# Patient Record
Sex: Female | Born: 1965 | Race: Black or African American | Hispanic: No | Marital: Single | Smoking: Never smoker
Health system: Southern US, Community
[De-identification: ages and names within clinical notes are randomized; demographics above are authoritative.]

## PROBLEM LIST (undated history)

## (undated) DIAGNOSIS — B009 Herpesviral infection, unspecified: Secondary | ICD-10-CM

## (undated) DIAGNOSIS — D649 Anemia, unspecified: Secondary | ICD-10-CM

## (undated) DIAGNOSIS — R011 Cardiac murmur, unspecified: Secondary | ICD-10-CM

## (undated) DIAGNOSIS — M1990 Unspecified osteoarthritis, unspecified site: Secondary | ICD-10-CM

## (undated) DIAGNOSIS — R87610 Atypical squamous cells of undetermined significance on cytologic smear of cervix (ASC-US): Secondary | ICD-10-CM

## (undated) DIAGNOSIS — E119 Type 2 diabetes mellitus without complications: Secondary | ICD-10-CM

## (undated) DIAGNOSIS — I10 Essential (primary) hypertension: Secondary | ICD-10-CM

## (undated) DIAGNOSIS — R87810 Cervical high risk human papillomavirus (HPV) DNA test positive: Secondary | ICD-10-CM

## (undated) HISTORY — DX: Atypical squamous cells of undetermined significance on cytologic smear of cervix (ASC-US): R87.610

## (undated) HISTORY — DX: Type 2 diabetes mellitus without complications: E11.9

## (undated) HISTORY — PX: COSMETIC SURGERY: SHX468

## (undated) HISTORY — PX: BREAST SURGERY: SHX581

## (undated) HISTORY — DX: Cervical high risk human papillomavirus (HPV) DNA test positive: R87.810

## (undated) HISTORY — DX: Herpesviral infection, unspecified: B00.9

---

## 1998-07-28 ENCOUNTER — Ambulatory Visit (HOSPITAL_COMMUNITY): Admission: RE | Admit: 1998-07-28 | Discharge: 1998-07-28 | Payer: Self-pay | Admitting: Family Medicine

## 1999-03-01 ENCOUNTER — Ambulatory Visit (HOSPITAL_COMMUNITY): Admission: RE | Admit: 1999-03-01 | Discharge: 1999-03-01 | Payer: Self-pay | Admitting: Family Medicine

## 1999-08-31 ENCOUNTER — Other Ambulatory Visit: Admission: RE | Admit: 1999-08-31 | Discharge: 1999-08-31 | Payer: Self-pay | Admitting: Family Medicine

## 1999-09-27 ENCOUNTER — Encounter: Payer: Self-pay | Admitting: Family Medicine

## 1999-09-27 ENCOUNTER — Ambulatory Visit (HOSPITAL_COMMUNITY): Admission: RE | Admit: 1999-09-27 | Discharge: 1999-09-27 | Payer: Self-pay | Admitting: Family Medicine

## 2001-07-15 ENCOUNTER — Other Ambulatory Visit: Admission: RE | Admit: 2001-07-15 | Discharge: 2001-07-15 | Payer: Self-pay | Admitting: Family Medicine

## 2001-08-16 ENCOUNTER — Encounter: Admission: RE | Admit: 2001-08-16 | Discharge: 2001-11-14 | Payer: Self-pay | Admitting: Surgical Oncology

## 2002-11-04 ENCOUNTER — Emergency Department (HOSPITAL_COMMUNITY): Admission: EM | Admit: 2002-11-04 | Discharge: 2002-11-05 | Payer: Self-pay | Admitting: Emergency Medicine

## 2002-11-05 ENCOUNTER — Encounter: Payer: Self-pay | Admitting: *Deleted

## 2005-01-20 ENCOUNTER — Emergency Department (HOSPITAL_COMMUNITY): Admission: EM | Admit: 2005-01-20 | Discharge: 2005-01-21 | Payer: Self-pay | Admitting: Emergency Medicine

## 2006-02-17 ENCOUNTER — Emergency Department (HOSPITAL_COMMUNITY): Admission: EM | Admit: 2006-02-17 | Discharge: 2006-02-17 | Payer: Self-pay | Admitting: Family Medicine

## 2007-04-25 ENCOUNTER — Emergency Department (HOSPITAL_COMMUNITY): Admission: EM | Admit: 2007-04-25 | Discharge: 2007-04-25 | Payer: Self-pay | Admitting: Family Medicine

## 2007-04-26 ENCOUNTER — Emergency Department (HOSPITAL_COMMUNITY): Admission: EM | Admit: 2007-04-26 | Discharge: 2007-04-26 | Payer: Self-pay | Admitting: Emergency Medicine

## 2007-05-23 ENCOUNTER — Ambulatory Visit: Payer: Self-pay | Admitting: Internal Medicine

## 2007-06-13 ENCOUNTER — Ambulatory Visit: Payer: Self-pay | Admitting: Internal Medicine

## 2007-06-14 ENCOUNTER — Ambulatory Visit: Payer: Self-pay | Admitting: Internal Medicine

## 2007-08-10 DIAGNOSIS — E669 Obesity, unspecified: Secondary | ICD-10-CM | POA: Insufficient documentation

## 2007-08-10 DIAGNOSIS — E66811 Obesity, class 1: Secondary | ICD-10-CM | POA: Insufficient documentation

## 2007-08-10 DIAGNOSIS — D509 Iron deficiency anemia, unspecified: Secondary | ICD-10-CM | POA: Insufficient documentation

## 2007-08-10 DIAGNOSIS — I10 Essential (primary) hypertension: Secondary | ICD-10-CM | POA: Insufficient documentation

## 2007-08-15 ENCOUNTER — Encounter (INDEPENDENT_AMBULATORY_CARE_PROVIDER_SITE_OTHER): Payer: Self-pay | Admitting: Internal Medicine

## 2007-08-15 ENCOUNTER — Ambulatory Visit: Payer: Self-pay | Admitting: Internal Medicine

## 2007-08-15 DIAGNOSIS — A5901 Trichomonal vulvovaginitis: Secondary | ICD-10-CM | POA: Insufficient documentation

## 2007-08-15 LAB — CONVERTED CEMR LAB
ALT: 12 units/L (ref 0–35)
Basophils Absolute: 0 10*3/uL (ref 0.0–0.1)
CO2: 26 meq/L (ref 19–32)
Calcium: 10 mg/dL (ref 8.4–10.5)
Chloride: 101 meq/L (ref 96–112)
Hemoglobin: 11.2 g/dL — ABNORMAL LOW (ref 12.0–15.0)
Lymphocytes Relative: 39 % (ref 12–46)
Lymphs Abs: 3.5 10*3/uL — ABNORMAL HIGH (ref 0.7–3.3)
MCHC: 30.3 g/dL (ref 30.0–36.0)
MCV: 91.8 fL (ref 78.0–100.0)
Neutro Abs: 4.4 10*3/uL (ref 1.7–7.7)
Neutrophils Relative %: 49 % (ref 43–77)
Platelets: 493 10*3/uL — ABNORMAL HIGH (ref 150–400)
RBC: 4.03 M/uL (ref 3.87–5.11)
Sodium: 140 meq/L (ref 135–145)
Total Bilirubin: 0.3 mg/dL (ref 0.3–1.2)
Total Protein: 8.3 g/dL (ref 6.0–8.3)

## 2007-08-27 ENCOUNTER — Ambulatory Visit: Payer: Self-pay | Admitting: Internal Medicine

## 2007-09-24 ENCOUNTER — Encounter (INDEPENDENT_AMBULATORY_CARE_PROVIDER_SITE_OTHER): Payer: Self-pay | Admitting: Internal Medicine

## 2007-10-29 ENCOUNTER — Ambulatory Visit: Payer: Self-pay | Admitting: Internal Medicine

## 2007-10-31 ENCOUNTER — Ambulatory Visit: Payer: Self-pay | Admitting: Internal Medicine

## 2007-11-04 ENCOUNTER — Encounter (INDEPENDENT_AMBULATORY_CARE_PROVIDER_SITE_OTHER): Payer: Self-pay | Admitting: Internal Medicine

## 2007-12-24 ENCOUNTER — Ambulatory Visit: Payer: Self-pay | Admitting: Internal Medicine

## 2007-12-24 LAB — CONVERTED CEMR LAB: Blood Glucose, Fingerstick: 161

## 2007-12-25 ENCOUNTER — Telehealth (INDEPENDENT_AMBULATORY_CARE_PROVIDER_SITE_OTHER): Payer: Self-pay | Admitting: Internal Medicine

## 2007-12-31 ENCOUNTER — Ambulatory Visit: Payer: Self-pay | Admitting: Family Medicine

## 2007-12-31 ENCOUNTER — Encounter (INDEPENDENT_AMBULATORY_CARE_PROVIDER_SITE_OTHER): Payer: Self-pay | Admitting: Internal Medicine

## 2008-01-03 ENCOUNTER — Ambulatory Visit: Payer: Self-pay | Admitting: *Deleted

## 2008-02-04 ENCOUNTER — Ambulatory Visit: Payer: Self-pay | Admitting: Internal Medicine

## 2008-02-04 DIAGNOSIS — R7302 Impaired glucose tolerance (oral): Secondary | ICD-10-CM | POA: Insufficient documentation

## 2008-02-04 LAB — CONVERTED CEMR LAB
Blood Glucose, AC Bkfst: 125 mg/dL
Hgb A1c MFr Bld: 7.2 %

## 2008-07-04 ENCOUNTER — Emergency Department (HOSPITAL_COMMUNITY): Admission: EM | Admit: 2008-07-04 | Discharge: 2008-07-04 | Payer: Self-pay | Admitting: Emergency Medicine

## 2008-07-17 ENCOUNTER — Ambulatory Visit: Payer: Self-pay | Admitting: Internal Medicine

## 2008-07-18 HISTORY — PX: OTHER SURGICAL HISTORY: SHX169

## 2008-07-25 ENCOUNTER — Encounter (INDEPENDENT_AMBULATORY_CARE_PROVIDER_SITE_OTHER): Payer: Self-pay | Admitting: Internal Medicine

## 2008-07-25 LAB — CONVERTED CEMR LAB
Albumin: 3.9 g/dL (ref 3.5–5.2)
Alkaline Phosphatase: 85 units/L (ref 39–117)
BUN: 14 mg/dL (ref 6–23)
CO2: 25 meq/L (ref 19–32)
Calcium: 9.3 mg/dL (ref 8.4–10.5)
Cholesterol: 140 mg/dL (ref 0–200)
Eosinophils Absolute: 0.1 10*3/uL (ref 0.0–0.7)
Eosinophils Relative: 1 % (ref 0–5)
Glucose, Bld: 81 mg/dL (ref 70–99)
HCT: 35.4 % — ABNORMAL LOW (ref 36.0–46.0)
HDL: 40 mg/dL (ref >39)
LDL Cholesterol: 85 mg/dL (ref 0–99)
Lymphocytes Relative: 45 % (ref 12–46)
Monocytes Absolute: 0.6 10*3/uL (ref 0.1–1.0)
Neutrophils Relative %: 44 % (ref 43–77)
Platelets: 429 10*3/uL — ABNORMAL HIGH (ref 150–400)
Potassium: 4.6 meq/L (ref 3.5–5.3)
RBC: 3.91 M/uL (ref 3.87–5.11)
Sodium: 138 meq/L (ref 135–145)
Total Protein: 8.8 g/dL — ABNORMAL HIGH (ref 6.0–8.3)
Triglycerides: 77 mg/dL (ref <150)

## 2008-07-30 ENCOUNTER — Ambulatory Visit: Payer: Self-pay | Admitting: Internal Medicine

## 2008-08-08 LAB — CONVERTED CEMR LAB
Ferritin: 226 ng/mL (ref 10–291)
Iron: 35 ug/dL — ABNORMAL LOW (ref 42–145)
Saturation Ratios: 12 % — ABNORMAL LOW (ref 20–55)
TIBC: 284 ug/dL (ref 250–470)

## 2008-08-13 ENCOUNTER — Encounter (INDEPENDENT_AMBULATORY_CARE_PROVIDER_SITE_OTHER): Payer: Self-pay | Admitting: Internal Medicine

## 2008-08-13 ENCOUNTER — Telehealth (INDEPENDENT_AMBULATORY_CARE_PROVIDER_SITE_OTHER): Payer: Self-pay | Admitting: Internal Medicine

## 2008-08-13 DIAGNOSIS — K921 Melena: Secondary | ICD-10-CM | POA: Insufficient documentation

## 2008-09-10 ENCOUNTER — Encounter: Admission: RE | Admit: 2008-09-10 | Discharge: 2008-09-10 | Payer: Self-pay | Admitting: Internal Medicine

## 2008-09-18 ENCOUNTER — Ambulatory Visit (HOSPITAL_COMMUNITY): Admission: RE | Admit: 2008-09-18 | Discharge: 2008-09-20 | Payer: Self-pay | Admitting: Specialist

## 2008-09-20 ENCOUNTER — Encounter (INDEPENDENT_AMBULATORY_CARE_PROVIDER_SITE_OTHER): Payer: Self-pay | Admitting: Internal Medicine

## 2008-12-03 ENCOUNTER — Encounter: Admission: RE | Admit: 2008-12-03 | Discharge: 2009-01-20 | Payer: Self-pay | Admitting: Internal Medicine

## 2008-12-03 ENCOUNTER — Encounter (INDEPENDENT_AMBULATORY_CARE_PROVIDER_SITE_OTHER): Payer: Self-pay | Admitting: Internal Medicine

## 2008-12-04 ENCOUNTER — Telehealth (INDEPENDENT_AMBULATORY_CARE_PROVIDER_SITE_OTHER): Payer: Self-pay | Admitting: Internal Medicine

## 2008-12-22 ENCOUNTER — Ambulatory Visit: Payer: Self-pay | Admitting: Internal Medicine

## 2008-12-31 ENCOUNTER — Encounter (INDEPENDENT_AMBULATORY_CARE_PROVIDER_SITE_OTHER): Payer: Self-pay | Admitting: Internal Medicine

## 2009-01-08 ENCOUNTER — Ambulatory Visit: Payer: Self-pay | Admitting: Internal Medicine

## 2009-01-08 DIAGNOSIS — L659 Nonscarring hair loss, unspecified: Secondary | ICD-10-CM | POA: Insufficient documentation

## 2009-01-08 LAB — CONVERTED CEMR LAB
Amphetamine Screen, Ur: NEGATIVE
Barbiturate Quant, Ur: NEGATIVE
Benzodiazepines.: NEGATIVE
Blood Glucose, Fingerstick: 77
Cocaine Metabolites: NEGATIVE
Hgb A1c MFr Bld: 6 %
Opiate Screen, Urine: NEGATIVE
Phencyclidine (PCP): NEGATIVE

## 2009-01-13 LAB — CONVERTED CEMR LAB
Albumin: 3.9 g/dL (ref 3.5–5.2)
Alkaline Phosphatase: 84 units/L (ref 39–117)
Basophils Absolute: 0 10*3/uL (ref 0.0–0.1)
CO2: 23 meq/L (ref 19–32)
Calcium: 9.4 mg/dL (ref 8.4–10.5)
Chloride: 101 meq/L (ref 96–112)
Eosinophils Relative: 1 % (ref 0–5)
Lymphocytes Relative: 47 % — ABNORMAL HIGH (ref 12–46)
Lymphs Abs: 2.8 10*3/uL (ref 0.7–4.0)
MCV: 93.4 fL (ref 78.0–100.0)
Neutro Abs: 2.5 10*3/uL (ref 1.7–7.7)
Neutrophils Relative %: 43 % (ref 43–77)
Platelets: 469 10*3/uL — ABNORMAL HIGH (ref 150–400)
Potassium: 4.4 meq/L (ref 3.5–5.3)
RBC: 3.94 M/uL (ref 3.87–5.11)
Sodium: 138 meq/L (ref 135–145)
TSH: 0.831 microintl units/mL (ref 0.350–4.50)
Total Protein: 8.9 g/dL — ABNORMAL HIGH (ref 6.0–8.3)
WBC: 5.9 10*3/uL (ref 4.0–10.5)

## 2009-01-19 ENCOUNTER — Encounter (INDEPENDENT_AMBULATORY_CARE_PROVIDER_SITE_OTHER): Payer: Self-pay | Admitting: Internal Medicine

## 2009-01-20 ENCOUNTER — Encounter (INDEPENDENT_AMBULATORY_CARE_PROVIDER_SITE_OTHER): Payer: Self-pay | Admitting: Internal Medicine

## 2009-01-26 ENCOUNTER — Ambulatory Visit: Payer: Self-pay | Admitting: Internal Medicine

## 2009-02-02 ENCOUNTER — Encounter (INDEPENDENT_AMBULATORY_CARE_PROVIDER_SITE_OTHER): Payer: Self-pay | Admitting: Internal Medicine

## 2009-02-07 ENCOUNTER — Encounter (INDEPENDENT_AMBULATORY_CARE_PROVIDER_SITE_OTHER): Payer: Self-pay | Admitting: Internal Medicine

## 2009-02-10 ENCOUNTER — Encounter (INDEPENDENT_AMBULATORY_CARE_PROVIDER_SITE_OTHER): Payer: Self-pay | Admitting: Internal Medicine

## 2009-02-16 ENCOUNTER — Encounter (INDEPENDENT_AMBULATORY_CARE_PROVIDER_SITE_OTHER): Payer: Self-pay | Admitting: Internal Medicine

## 2009-04-30 ENCOUNTER — Ambulatory Visit: Payer: Self-pay | Admitting: Internal Medicine

## 2009-04-30 DIAGNOSIS — J309 Allergic rhinitis, unspecified: Secondary | ICD-10-CM | POA: Insufficient documentation

## 2009-04-30 LAB — CONVERTED CEMR LAB: Blood Glucose, Fingerstick: 100

## 2009-05-05 ENCOUNTER — Telehealth (INDEPENDENT_AMBULATORY_CARE_PROVIDER_SITE_OTHER): Payer: Self-pay | Admitting: Internal Medicine

## 2009-05-12 ENCOUNTER — Telehealth (INDEPENDENT_AMBULATORY_CARE_PROVIDER_SITE_OTHER): Payer: Self-pay | Admitting: Internal Medicine

## 2009-05-13 ENCOUNTER — Telehealth (INDEPENDENT_AMBULATORY_CARE_PROVIDER_SITE_OTHER): Payer: Self-pay | Admitting: Family Medicine

## 2009-06-07 ENCOUNTER — Ambulatory Visit: Payer: Self-pay | Admitting: Family Medicine

## 2009-06-07 DIAGNOSIS — M549 Dorsalgia, unspecified: Secondary | ICD-10-CM | POA: Insufficient documentation

## 2009-06-07 LAB — CONVERTED CEMR LAB
Bilirubin Urine: NEGATIVE
Blood in Urine, dipstick: NEGATIVE
Hgb A1c MFr Bld: 6.2 %

## 2009-06-17 HISTORY — PX: GASTRIC BYPASS: SHX52

## 2009-07-01 ENCOUNTER — Encounter (INDEPENDENT_AMBULATORY_CARE_PROVIDER_SITE_OTHER): Payer: Self-pay | Admitting: Internal Medicine

## 2009-07-08 ENCOUNTER — Encounter (INDEPENDENT_AMBULATORY_CARE_PROVIDER_SITE_OTHER): Payer: Self-pay | Admitting: Internal Medicine

## 2009-07-09 ENCOUNTER — Encounter (INDEPENDENT_AMBULATORY_CARE_PROVIDER_SITE_OTHER): Payer: Self-pay | Admitting: Internal Medicine

## 2009-08-11 ENCOUNTER — Encounter (INDEPENDENT_AMBULATORY_CARE_PROVIDER_SITE_OTHER): Payer: Self-pay | Admitting: Internal Medicine

## 2009-08-26 ENCOUNTER — Ambulatory Visit: Payer: Self-pay | Admitting: Internal Medicine

## 2009-08-26 LAB — CONVERTED CEMR LAB: Hgb A1c MFr Bld: 5.9 %

## 2009-08-31 ENCOUNTER — Encounter (INDEPENDENT_AMBULATORY_CARE_PROVIDER_SITE_OTHER): Payer: Self-pay | Admitting: Internal Medicine

## 2009-09-23 ENCOUNTER — Encounter (INDEPENDENT_AMBULATORY_CARE_PROVIDER_SITE_OTHER): Payer: Self-pay | Admitting: Internal Medicine

## 2009-09-24 ENCOUNTER — Encounter (INDEPENDENT_AMBULATORY_CARE_PROVIDER_SITE_OTHER): Payer: Self-pay | Admitting: Internal Medicine

## 2009-09-30 ENCOUNTER — Encounter (INDEPENDENT_AMBULATORY_CARE_PROVIDER_SITE_OTHER): Payer: Self-pay | Admitting: Internal Medicine

## 2009-10-01 ENCOUNTER — Telehealth (INDEPENDENT_AMBULATORY_CARE_PROVIDER_SITE_OTHER): Payer: Self-pay | Admitting: Internal Medicine

## 2009-10-14 ENCOUNTER — Encounter (INDEPENDENT_AMBULATORY_CARE_PROVIDER_SITE_OTHER): Payer: Self-pay | Admitting: Internal Medicine

## 2009-10-14 ENCOUNTER — Encounter: Admission: RE | Admit: 2009-10-14 | Discharge: 2009-12-15 | Payer: Self-pay | Admitting: Internal Medicine

## 2009-12-24 ENCOUNTER — Other Ambulatory Visit: Admission: RE | Admit: 2009-12-24 | Discharge: 2009-12-24 | Payer: Self-pay | Admitting: Internal Medicine

## 2009-12-24 ENCOUNTER — Ambulatory Visit: Payer: Self-pay | Admitting: Internal Medicine

## 2009-12-24 DIAGNOSIS — N760 Acute vaginitis: Secondary | ICD-10-CM | POA: Insufficient documentation

## 2009-12-24 DIAGNOSIS — M1710 Unilateral primary osteoarthritis, unspecified knee: Secondary | ICD-10-CM

## 2009-12-24 DIAGNOSIS — IMO0002 Reserved for concepts with insufficient information to code with codable children: Secondary | ICD-10-CM | POA: Insufficient documentation

## 2009-12-24 LAB — CONVERTED CEMR LAB
Bilirubin Urine: NEGATIVE
Blood in Urine, dipstick: NEGATIVE
Chlamydia, DNA Probe: NEGATIVE
Glucose, Urine, Semiquant: NEGATIVE
Nitrite: NEGATIVE
Protein, U semiquant: NEGATIVE
Specific Gravity, Urine: 1.01
Urobilinogen, UA: 1
WBC Urine, dipstick: NEGATIVE
Whiff Test: NEGATIVE

## 2009-12-27 ENCOUNTER — Ambulatory Visit: Payer: Self-pay | Admitting: Internal Medicine

## 2009-12-29 ENCOUNTER — Encounter (INDEPENDENT_AMBULATORY_CARE_PROVIDER_SITE_OTHER): Payer: Self-pay | Admitting: Internal Medicine

## 2009-12-29 LAB — CONVERTED CEMR LAB
ALT: 10 units/L (ref 0–35)
Alkaline Phosphatase: 100 units/L (ref 39–117)
Basophils Relative: 0 % (ref 0–1)
CO2: 23 meq/L (ref 19–32)
Chloride: 104 meq/L (ref 96–112)
Cholesterol: 133 mg/dL (ref 0–200)
Creatinine, Ser: 0.66 mg/dL (ref 0.40–1.20)
Eosinophils Absolute: 0 10*3/uL (ref 0.0–0.7)
HDL: 43 mg/dL (ref >39)
Hemoglobin: 10.7 g/dL — ABNORMAL LOW (ref 12.0–15.0)
LDL Cholesterol: 77 mg/dL (ref 0–99)
Lymphocytes Relative: 51 % — ABNORMAL HIGH (ref 12–46)
Lymphs Abs: 2.4 10*3/uL (ref 0.7–4.0)
MCHC: 31 g/dL (ref 30.0–36.0)
Monocytes Absolute: 0.4 10*3/uL (ref 0.1–1.0)
Monocytes Relative: 9 % (ref 3–12)
Neutro Abs: 1.8 10*3/uL (ref 1.7–7.7)
Neutrophils Relative %: 39 % — ABNORMAL LOW (ref 43–77)
RBC: 3.63 M/uL — ABNORMAL LOW (ref 3.87–5.11)
Sodium: 142 meq/L (ref 135–145)
Total Bilirubin: 0.3 mg/dL (ref 0.3–1.2)
Total Protein: 7.7 g/dL (ref 6.0–8.3)
Triglycerides: 67 mg/dL (ref <150)
WBC: 4.6 10*3/uL (ref 4.0–10.5)

## 2009-12-30 ENCOUNTER — Ambulatory Visit (HOSPITAL_COMMUNITY): Admission: RE | Admit: 2009-12-30 | Discharge: 2009-12-30 | Payer: Self-pay | Admitting: Internal Medicine

## 2009-12-31 ENCOUNTER — Ambulatory Visit: Payer: Self-pay | Admitting: Internal Medicine

## 2009-12-31 LAB — CONVERTED CEMR LAB: RBC Folate: 279 ng/mL (ref 180–600)

## 2010-01-21 LAB — CONVERTED CEMR LAB
Ferritin: 256 ng/mL (ref 10–291)
TIBC: 302 ug/dL (ref 250–470)
Vitamin B-12: 636 pg/mL (ref 211–911)

## 2010-02-10 ENCOUNTER — Encounter (INDEPENDENT_AMBULATORY_CARE_PROVIDER_SITE_OTHER): Payer: Self-pay | Admitting: Internal Medicine

## 2010-03-11 ENCOUNTER — Encounter (INDEPENDENT_AMBULATORY_CARE_PROVIDER_SITE_OTHER): Payer: Self-pay | Admitting: Internal Medicine

## 2010-08-11 ENCOUNTER — Encounter (INDEPENDENT_AMBULATORY_CARE_PROVIDER_SITE_OTHER): Payer: Self-pay | Admitting: Internal Medicine

## 2010-09-02 ENCOUNTER — Ambulatory Visit: Payer: Self-pay | Admitting: Internal Medicine

## 2010-09-02 ENCOUNTER — Encounter (INDEPENDENT_AMBULATORY_CARE_PROVIDER_SITE_OTHER): Payer: Self-pay | Admitting: Internal Medicine

## 2010-09-02 DIAGNOSIS — N920 Excessive and frequent menstruation with regular cycle: Secondary | ICD-10-CM | POA: Insufficient documentation

## 2010-09-02 LAB — CONVERTED CEMR LAB: Blood Glucose, Fingerstick: 65

## 2010-09-15 LAB — CONVERTED CEMR LAB
Eosinophils Relative: 1 % (ref 0–5)
HCT: 33.3 % — ABNORMAL LOW (ref 36.0–46.0)
Hemoglobin: 10.4 g/dL — ABNORMAL LOW (ref 12.0–15.0)
Lymphocytes Relative: 52 % — ABNORMAL HIGH (ref 12–46)
Lymphs Abs: 2.9 10*3/uL (ref 0.7–4.0)
Neutro Abs: 2.2 10*3/uL (ref 1.7–7.7)
Platelets: 349 10*3/uL (ref 150–400)
Vit D, 25-Hydroxy: 25 ng/mL — ABNORMAL LOW (ref 30–89)
WBC: 5.7 10*3/uL (ref 4.0–10.5)

## 2010-09-21 ENCOUNTER — Ambulatory Visit: Payer: Self-pay | Admitting: Internal Medicine

## 2010-09-22 ENCOUNTER — Encounter (INDEPENDENT_AMBULATORY_CARE_PROVIDER_SITE_OTHER): Payer: Self-pay | Admitting: Internal Medicine

## 2010-09-28 LAB — CONVERTED CEMR LAB: Iron: 60 ug/dL (ref 42–145)

## 2010-10-03 ENCOUNTER — Ambulatory Visit: Payer: Self-pay | Admitting: Internal Medicine

## 2010-10-03 LAB — CONVERTED CEMR LAB
Calcium: 9 mg/dL (ref 8.4–10.5)
Sodium: 144 meq/L (ref 135–145)

## 2010-10-24 ENCOUNTER — Encounter (INDEPENDENT_AMBULATORY_CARE_PROVIDER_SITE_OTHER): Payer: Self-pay | Admitting: Internal Medicine

## 2010-10-24 ENCOUNTER — Ambulatory Visit: Payer: Self-pay | Admitting: Physician Assistant

## 2010-10-24 DIAGNOSIS — I498 Other specified cardiac arrhythmias: Secondary | ICD-10-CM | POA: Insufficient documentation

## 2010-11-30 ENCOUNTER — Ambulatory Visit: Payer: Self-pay | Admitting: Internal Medicine

## 2010-12-06 ENCOUNTER — Ambulatory Visit: Payer: Self-pay | Admitting: Internal Medicine

## 2010-12-06 DIAGNOSIS — M79609 Pain in unspecified limb: Secondary | ICD-10-CM | POA: Insufficient documentation

## 2010-12-06 LAB — CONVERTED CEMR LAB: Hgb A1c MFr Bld: 5.7 %

## 2010-12-29 ENCOUNTER — Ambulatory Visit: Admit: 2010-12-29 | Payer: Self-pay | Admitting: Internal Medicine

## 2011-01-03 ENCOUNTER — Encounter (INDEPENDENT_AMBULATORY_CARE_PROVIDER_SITE_OTHER): Payer: Self-pay | Admitting: Internal Medicine

## 2011-01-17 ENCOUNTER — Encounter (INDEPENDENT_AMBULATORY_CARE_PROVIDER_SITE_OTHER): Payer: Self-pay | Admitting: Internal Medicine

## 2011-01-19 ENCOUNTER — Encounter (INDEPENDENT_AMBULATORY_CARE_PROVIDER_SITE_OTHER): Payer: Self-pay | Admitting: Internal Medicine

## 2011-01-19 NOTE — Letter (Signed)
Summary: [facility]  [facility]   Imported By: Arta Bruce 03/11/2010 09:56:39  _____________________________________________________________________  External Attachment:    Type:   Image     Comment:   External Document

## 2011-01-19 NOTE — Assessment & Plan Note (Signed)
Summary: 2-3 WEEK FU FOR BP FU///KT  Nurse Visit   Vital Signs:  Patient profile:   45 year old female Menstrual status:  regular Weight:      206.4 pounds Pulse rate:   52 / minute Pulse rhythm:   regular Resp:     20 per minute BP sitting:   146 / 80  (left arm) Cuff size:   large  Serial Vital Signs/Assessments:  Time      Position  BP       Pulse  Resp  Temp     By                              48                    Dutch Quint RN                              [address] RN  Comments: After 5 lap walk around office. By: Dutch Quint RN    Physical Exam  Lungs:  normal respiratory effort, normal breath sounds, no crackles, and no wheezes.   Heart:  normal rate and regular rhythm.  Heart rate 52, bradycardic.   Impression & Recommendations:  Problem # 1:  BRADYCARDIA (ICD-427.89)  reviewed previous visits and some with brady pulse rate EKG rate at 41 today no pulse limiting meds Pt did see cardiologist on last year - she will bring records to this office pt is athletic and does zumba and aerobics  Orders: EKG w/ Interpretation (93000)  Problem # 2:  HYPERTENSION (ICD-401.9) BP still slightly elevated but will not change at this time given need to review cardiology reports Her updated medication list for this problem includes:    Lisinopril-hydrochlorothiazide 20-12.5 Mg Tabs (Lisinopril-hydrochlorothiazide) .Marland Kitchen... 1 by mouth two times a day    Amlodipine Besylate 5 Mg Tabs (Amlodipine besylate) .Marland Kitchen... Take 1 tablet by mouth once a day  Complete Medication List: 1)  Lisinopril-hydrochlorothiazide 20-12.5 Mg Tabs (Lisinopril-hydrochlorothiazide) .Marland Kitchen.. 1 by mouth two times a day 2)  Ferrous Sulfate 325 (65 Fe) Mg Tbec (Ferrous sulfate) .Marland Kitchen.. 1 by mouth two times a day 3)  Loratadine 10 Mg Tabs (Loratadine) .Marland Kitchen.. 1 tab by mouth daily 4)  Ursodiol 300 Mg Caps (Ursodiol) .Marland Kitchen.. 1 by mouth two times a day 5)  Omeprazole 20 Mg Cpdr (Omeprazole) .Marland Kitchen..  1 by mouth once daily 6)  Celebrate Bariatric Supplements  .Marland Kitchen.. 1 capsule two times a day 7)  Loestrin Fe 1.5/30 1.5-30 Mg-mcg Tabs (Norethin ace-eth estrad-fe) .Marland Kitchen.. 1 tab daily as needed--dr. Pecola Leisure at [facility] family practice for heavy bleeding. 8)  Vitamin D3 50000 Unit Caps (Cholecalciferol) .Marland Kitchen.. 1 cap by mouth weekly 9)  Protonix 40 Mg Tbec (Pantoprazole sodium) .Marland Kitchen.. 1 cap by mouth daily on empty stomach 30 minutes before breakfast. 10)  Amlodipine Besylate 5 Mg Tabs (Amlodipine besylate) .... Take 1 tablet by mouth once a day   CC:  F/U BP check.  History of Present Illness: 10/03/10 BP was 152/78, restarted on amlodipine.  States she has taken all meds this morning.  States asymptomatic except for being a "a little off-balance/woozy" on Saturday.   Review of Systems General:  Denies fatigue and weakness. CV:  Complains of lightheadness; One episode of lightheadedness on Saturday, while cleaning her house..  CC: F/U BP check Is Patient Diabetic? Yes Did you bring your meter with you today? No Pain Assessment Patient in pain? no       Does patient need assistance? Functional Status Self care Ambulation Normal  Patient Instructions: 1)  Reviewed by Jesse Fall 2)  Your blood pressure is still elevated, but your heartrate is low. 3)  Bring your journal with cardiology information to [place] for review. 4)  We'll let you know if you need to come in to see Dr. Delrae Alfred before your scheduled appointment. 5)  Make sure you take all of your medications before your visit. 6)  Call if anything changes or if you have any questions.   Allergies: No Known Drug Allergies  Orders Added: 1)  Est. Patient Level II [04540] 2)  EKG w/ Interpretation [93000]

## 2011-01-19 NOTE — Assessment & Plan Note (Signed)
Summary: cpp exam//gk   Vital Signs:  Patient profile:   45 year old female Menstrual status:  regular Weight:      253.6 pounds Temp:     97.9 degrees F oral Pulse rate:   53 / minute Pulse rhythm:   regular Resp:     17 per minute BP sitting:   132 / 82  (left arm) Cuff size:   large  Vitals Entered By: Geanie Cooley  (December 24, 2009 3:36 PM) CC: pt here for CPP,ptstates she has had very dry skin in the cheek bone area and ears. Pt states she has been have vaginal discharge, she did have a lil itching and burning at first but not now. Pain Assessment Patient in pain? no       Does patient need assistance? Functional Status Self care Ambulation Normal   CC:  pt here for CPP, ptstates she has had very dry skin in the cheek bone area and ears. Pt states she has been have vaginal discharge, and she did have a lil itching and burning at first but not now..  History of Present Illness: 45 yo female here for CPP  Concerns:  1.  Dryness on face--including bilateral ears and extending forward onto face and chin bilaterally.  States has had this problem since gastric bypass surgery 6 months ago.  Has tried an all natural Vitamin E containing lotion--helps a bit--has to apply 3 times daily.  Takes a multivitamin, Vitamin D, B1, B12, calcium and chewable iron tab.  2.  Vaginal discharge-yellow and thick:  Had itching and odor associated with it.  Started in December.  Gradually improved.  Did use Norforms deodorant vaginal suppositories.  Has been wearing some tighter fitting clothes.  No definite antibiotics prior to symptoms  Habits & Providers  Alcohol-Tobacco-Diet     Alcohol drinks/day: 0     Tobacco Status: never  Exercise-Depression-Behavior     Drug Use: never  Allergies (verified): No Known Drug Allergies  Past History:  Past Medical History: VITAMIN D DEFICIENCY (ICD-268.9) BACK PAIN (ICD-724.5) ALLERGIC RHINITIS WITH CONJUNCTIVITIS (ICD-477.9) PREOPERATIVE  EXAMINATION (ICD-V72.84) HAIR LOSS (ICD-704.00) HEMOCCULT POSITIVE STOOL (ICD-578.1) ENCOUNTER FOR LONG-TERM USE OF OTHER MEDICATIONS (ICD-V58.69) DIABETES MELLITUS, CONTROLLED (ICD-250.00) HYPERGLYCEMIA (ICD-790.29) OBESITY, MORBID (ICD-278.01) ANEMIA, IRON DEFICIENCY (ICD-280.9) HYPERTENSION (ICD-401.9) Hx of TRICHOMONAL VAGINITIS (ICD-131.01)    Past Surgical History: Reviewed history from 08/11/2009 and no changes required. 1.  09/18/2008:  Right quadriceps tendon repair--Dr. Thomasena Edis 2.  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 Rosalita Chessman  [facility]  Bariatric program  Family History: Mother, 42:  Hypertension, DM, overweight Father, died 80:  MI, hypertension 4 Brothers:  all healthy 3 Sisters:  Twin sister Merrill:  DM, hypertension--seen here Jasmine December:  DM.  Other sister is healthy Daughter, 16:  Healthy  Social History: Female partner for 3 years--monogamous. Do not live together--he lives in [place]. 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  Group Home--MR and DD folks live there. LIves with 12 yo daughter.Drug Use:  never  Review of Systems General:  Energy is good--especially with weight loss. Eyes:  Wears contacts--no problems. ENT:  Denies decreased hearing. CV:  Denies chest pain or discomfort and palpitations. Resp:  Denies shortness of breath. GI:  Denies abdominal pain, bloody stools, constipation, dark tarry stools, and diarrhea. GU:  Denies dysuria and urinary frequency; See HPI for vaginal complaints. MS:  had cortisone shot in left knee with Dr. Darrelyn Hillock at [facility] week.  Marland Kitchen  Derm:  Mole on left shoulder--has enlarged a little bit over time.  Has had for as long as she can remember.. Neuro:  Denies numbness, tingling, and weakness. Psych:  Denies anxiety, depression, and suicidal thoughts/plans.  Physical Exam  General:  obese--though appears larger than she is secondary to significant skin folds, NAD Head:  Normocephalic and atraumatic  without obvious abnormalities. Eyes:  No corneal or conjunctival inflammation noted. EOMI. Perrla. Funduscopic exam benign, without hemorrhages, exudates or papilledema. Vision grossly normal. Ears:  External ear exam shows no significant lesions or deformities.  Otoscopic examination reveals clear canals, tympanic membranes are intact bilaterally without bulging, retraction, inflammation or discharge. Hearing is grossly normal bilaterally. Nose:  External nasal examination shows no deformity or inflammation. Nasal mucosa are pink and moist without lesions or exudates. Mouth:  Oral mucosa and oropharynx without lesions or exudates.   Neck:  No deformities, masses, or tenderness noted. Breasts:  No mass, nodules, thickening, tenderness, bulging, retraction, inflamation, nipple discharge or skin changes noted.   Lungs:  Normal respiratory effort, chest expands symmetrically. Lungs are clear to auscultation, no crackles or wheezes. Heart:  Normal rate and regular rhythm. S1 and S2 normal without gallop, murmur, click, rub or other extra sounds. Abdomen:  Bowel sounds positive,abdomen soft and non-tender without masses, organomegaly or hernias noted. Rectal:  No external abnormalities noted. Normal sphincter tone. No rectal masses or tenderness.  Heme negative light brown stool. Genitalia:  Pelvic Exam:        External: normal female genitalia without lesions or masses        Vagina: Chronic appearing white curdlike discharge without odor.  Some white discharge adherent to mucosal walls and cervix.  No significant inflammation.        Cervix: normal without lesions or masses        Adnexa: normal bimanual exam without masses or fullness        Uterus: normal by palpation        Pap smear: performed Msk:  No deformity or scoliosis noted of thoracic or lumbar spine.  Knees hypertrophic appearing Pulses:  R and L carotid,radial,femoral,dorsalis pedis and posterior tibial pulses are full and equal  bilaterally Extremities:  No clubbing, cyanosis, edema, or deformity noted with normal full range of motion of all joints.   Neurologic:  No cranial nerve deficits noted. Station and gait are normal. Plantar reflexes are down-going bilaterally. DTRs are symmetrical throughout. Sensory, motor and coordinative functions appear intact. Skin:  A bit hyperpigmented and dry on cheeks of face to chin. Significant skin folds on central body and appendages. Cervical Nodes:  No lymphadenopathy noted Axillary Nodes:  No palpable lymphadenopathy Inguinal Nodes:  No significant adenopathy Psych:  Cognition and judgment appear intact. Alert and cooperative with normal attention span and concentration. No apparent delusions, illusions, hallucinations   Impression & Recommendations:  Problem # 1:  ROUTINE GYNECOLOGICAL EXAMINATION (ICD-V72.31) Mammogram scheduled Orders: T-Pap Smear, Thin Prep (65784) T- GC Chlamydia (69629) KOH/ WET Mount [phone]) UA Dipstick w/o Micro (manual) (81002) Pap Smear, Thin Prep ( Collection of) (L2440)  Problem # 2:  VAGINITIS (ICD-616.10) Cells that somewhat look like trichomonas, possibly even with flagella, but no movement Appears clinically like yeast vaginitis Fluconazole 150 mg daily for 3 days  Problem # 3:  Preventive Health Care (ICD-V70.0) Flu vaccine  Guaiac cards x3 to return in 2 weeks.  Problem # 4:  DIABETES MELLITUS, CONTROLLED (ICD-250.00) No meds with weight loss from gastric bypass--has been controlled.  Her updated medication list for this problem includes:    Lisinopril-hydrochlorothiazide 20-12.5 Mg Tabs (Lisinopril-hydrochlorothiazide) .Marland Kitchen... 1 by mouth two times a day  Problem # 5:  HYPERTENSION (ICD-401.9) Controlled Her updated medication list for this problem includes:    Lisinopril-hydrochlorothiazide 20-12.5 Mg Tabs (Lisinopril-hydrochlorothiazide) .Marland Kitchen... 1 by mouth two times a day    Amlodipine Besylate 10 Mg Tabs (Amlodipine besylate)  .Marland Kitchen... 1 tab by mouth daily  Complete Medication List: 1)  Lisinopril-hydrochlorothiazide 20-12.5 Mg Tabs (Lisinopril-hydrochlorothiazide) .Marland Kitchen.. 1 by mouth two times a day 2)  Ferrous Sulfate 325 (65 Fe) Mg Tbec (Ferrous sulfate) .Marland Kitchen.. 1 by mouth two times a day 3)  Amlodipine Besylate 10 Mg Tabs (Amlodipine besylate) .Marland Kitchen.. 1 tab by mouth daily 4)  Loratadine 10 Mg Tabs (Loratadine) .Marland Kitchen.. 1 tab by mouth daily 5)  Ursodiol 300 Mg Caps (Ursodiol) .Marland Kitchen.. 1 by mouth two times a day 6)  Omeprazole 20 Mg Cpdr (Omeprazole) .Marland Kitchen.. 1 by mouth once daily 7)  Celebrate Bariatric Supplements  .Marland Kitchen.. 1 capsule two times a day 8)  Fluconazole 150 Mg Tabs (Fluconazole) .Marland Kitchen.. 1 tab by mouth daily for 3 days  Other Orders: Flu Vaccine 47yrs + (16109) TB Skin Test [phone]) Admin 1st Vaccine (09811) Admin of Any Addtl Vaccine (91478) Admin 1st Vaccine (State) [phone]) Admin of Any Addtl Vaccine (State) [phone])  Patient Instructions: 1)  Fasting labs with PPD reading next Monday--9 a.m.  FLP, Cmet, CBC, HIV, RPR,  Prescriptions: FLUCONAZOLE 150 MG TABS (FLUCONAZOLE) 1 tab by mouth daily for 3 days  #3 x 0   Entered and Authorized by:   Julieanne Manson MD   Signed by:   Julieanne Manson MD on 12/24/2009   Method used:   Electronically to        [facility] [phone]* (retail)       [address]       [place], [place]  [address]       Ph: [phone]       Fax: [phone]   RxID:   [id number] LORATADINE 10 MG TABS (LORATADINE) 1 tab by mouth daily  #30 x 11   Entered and Authorized by:   Julieanne Manson MD   Signed by:   Julieanne Manson MD on 12/24/2009   Method used:   Electronically to        [facility] [phone]* (retail)       [address]       [place], [place]  [address]       Ph: [phone]       Fax: [phone]   RxID:   [id number] AMLODIPINE BESYLATE 10 MG  TABS (AMLODIPINE BESYLATE) 1 tab by mouth daily  #30 x 11   Entered and Authorized by:   Julieanne Manson MD   Signed by:   Julieanne Manson MD on 12/24/2009   Method used:   Electronically to        [facility] [phone]* (retail)       [address]       [place], [place]  [address]       Ph: [phone]       Fax: [phone]   RxID:   [id number] FERROUS SULFATE 325 (65 FE) MG  TBEC (FERROUS SULFATE) 1 by mouth two times a day  #60 Each x 11   Entered and Authorized by:   Julieanne Manson MD   Signed by:   Julieanne Manson MD on 12/24/2009   Method used:  Electronically to        [facility] [phone]* (retail)       [address]       [place], [place]  [address]       Ph: [phone]       Fax: [phone]   RxID:   [id number] LISINOPRIL-HYDROCHLOROTHIAZIDE 20-12.5 MG  TABS (LISINOPRIL-HYDROCHLOROTHIAZIDE) 1 by mouth two times a day  #60 Each x 11   Entered and Authorized by:   Julieanne Manson MD   Signed by:   Julieanne Manson MD on 12/24/2009   Method used:   Electronically to        [facility] [phone]* (retail)       [address]       [place], [place]  [address]       Ph: [phone]       Fax: [phone]   RxID:   [id number]    Vital Signs:  Patient profile:   44 year old female Menstrual status:  regular Weight:      253.6 pounds Temp:     97.9 degrees F oral Pulse rate:   53 / minute Pulse rhythm:   regular Resp:     17 per minute BP sitting:   132 / 82  (left arm) Cuff size:   large  Vitals Entered By: Geanie Cooley  (December 24, 2009 3:36 PM)   Laboratory Results   Urine Tests  Date/Time Received: December 24, 2009 4:13 PM   Routine Urinalysis   Color: lt. yellow Appearance: Clear Glucose: negative   (Normal Range: Negative) Bilirubin: negative   (Normal Range: Negative) Ketone: negative   (Normal Range: Negative) Spec. Gravity: 1.010   (Normal Range: 1.003-1.035) Blood: negative   (Normal Range: Negative) pH: 7.0   (Normal Range: 5.0-8.0) Protein: negative   (Normal Range:  Negative) Urobilinogen: 1.0   (Normal Range: 0-1) Nitrite: negative   (Normal Range: Negative) Leukocyte Esterace: negative   (Normal Range: Negative)      Wet Mount Source: vaginal WBC/hpf: 5-10 Bacteria/hpf: 1+ Clue cells/hpf: few  Negative whiff Yeast/hpf: none seen Wet Mount KOH: Negative Trichomonas/hpf: Possible, but no movement     Influenza Vaccine    Vaccine Type: Fluvax 3+    Site: right deltoid    Mfr: Sanofi Pasteur    Dose: 0.5 ml    Route: IM    Given by: Geanie Cooley     Exp. Date: 06/16/2010    Lot #: [id number]    VIS given: 07/11/07 version given December 24, 2009.  Flu Vaccine Consent Questions    Do you have a history of severe allergic reactions to this vaccine? no    Any prior history of allergic reactions to egg and/or gelatin? no    Do you have a sensitivity to the preservative Thimersol? no    Do you have a past history of Guillan-Barre Syndrome? no    Do you currently have an acute febrile illness? no    Have you ever had a severe reaction to latex? no    Vaccine information given and explained to patient? yes    Are you currently pregnant? no  PPD Application    Vaccine Type: PPD    Site: right forearm    Mfr: Sanofi Pasteur    Dose: 0.1 ml    Route: ID    Given by: valincia Rathmann    Exp. Date: 01/15/2012    Lot #: [id number]   Preventive  Care Screening  Prior Values:    PPD:  negative (10/31/2007)    Last Tetanus Booster:  Tdap (07/17/2008)    Last Pneumovax:  Pneumovax (07/17/2008)     Pap:  Last appears to be in 07/2007--not in EMR--normal per pt. SBE:  No changes on monthly exam Mammogram:  not for a few years--normal previously. Periods:  skipping.  No hot flashes or night sweats. Osteoprevention:  exercises regularly with personal trainor--Vitamins currently handled by [facility]--Calcium and Vitamin D. Guaiac Cards:  Last year Colonoscopy:  Normal last year prior to gastric surgery EGD:  Normal last year prior to  gastric surgery--both Dr. Elnoria Howard.    Laboratory Results   Urine Tests    Routine Urinalysis   Color: lt. yellow Appearance: Clear Glucose: negative   (Normal Range: Negative) Bilirubin: negative   (Normal Range: Negative) Ketone: negative   (Normal Range: Negative) Spec. Gravity: 1.010   (Normal Range: 1.003-1.035) Blood: negative   (Normal Range: Negative) pH: 7.0   (Normal Range: 5.0-8.0) Protein: negative   (Normal Range: Negative) Urobilinogen: 1.0   (Normal Range: 0-1) Nitrite: negative   (Normal Range: Negative) Leukocyte Esterace: negative   (Normal Range: Negative)      Wet Mount/KOH  Negative whiff

## 2011-01-19 NOTE — Assessment & Plan Note (Signed)
Summary: PAIN IN LEFT SHOULDER AND ARM//KT   Vital Signs:  Patient profile:   45 year old female Menstrual status:  irregular LMP:     12/03/2010 Weight:      199 pounds BMI:     33.24 Temp:     97.3 degrees F oral Pulse rate:   64 / minute Pulse rhythm:   regular Resp:     18 per minute BP sitting:   122 / 70  (left arm) Cuff size:   regular  Vitals Entered By: Hale Drone CMA (December 06, 2010 4:12 PM) CC: F/U on left shoulder and arm pain. Wants to know if she can get a Vit B 12 shot since she has not taken her sub lingual Vit B12 for the last 2 months.  Is Patient Diabetic? No Pain Assessment Patient in pain? yes     Location: left arm Intensity: 5 Type: sharp Onset of pain  Intermittent CBG Result 113 CBG Device ID B non fasting  Does patient need assistance? Functional Status Self care Ambulation Normal LMP (date): 12/03/2010     Menstrual Status irregular Enter LMP: 12/03/2010 Last PAP Result NEGATIVE FOR INTRAEPITHELIAL LESIONS OR MALIGNANCY.   CC:  F/U on left shoulder and arm pain. Wants to know if she can get a Vit B 12 shot since she has not taken her sub lingual Vit B12 for the last 2 months. .  History of Present Illness: 1.  Left shoulder and arm pain:  Was very tender and difficult to move after lot of shopping and holding bags, etc.  Is improved now.    Current Medications (verified): 1)  Lisinopril-Hydrochlorothiazide 20-12.5 Mg  Tabs (Lisinopril-Hydrochlorothiazide) .Marland Kitchen.. 1 By Mouth Two Times A Day 2)  Ferrous Sulfate 325 (65 Fe) Mg  Tbec (Ferrous Sulfate) .Marland Kitchen.. 1 By Mouth Two Times A Day 3)  Loratadine 10 Mg Tabs (Loratadine) .Marland Kitchen.. 1 Tab By Mouth Daily 4)  Ursodiol 300 Mg Caps (Ursodiol) .Marland Kitchen.. 1 By Mouth Two Times A Day 5)  Omeprazole 20 Mg Cpdr (Omeprazole) .Marland Kitchen.. 1 By Mouth Once Daily 6)  Celebrate Bariatric Supplements .Marland KitchenMarland Kitchen. 1 Capsule Two Times A Day 7)  Loestrin Fe 1.5/30 1.5-30 Mg-Mcg Tabs (Norethin Ace-Eth Estrad-Fe) .Marland Kitchen.. 1 Tab Daily As  Needed--Dr. Pecola Leisure At [facility]. 8)  Vitamin D3 50000 Unit Caps (Cholecalciferol) .Marland Kitchen.. 1 Cap By Mouth Weekly 9)  Protonix 40 Mg Tbec (Pantoprazole Sodium) .Marland Kitchen.. 1 Cap By Mouth Daily On Empty Stomach 30 Minutes Before Breakfast. 10)  Amlodipine Besylate 5 Mg Tabs (Amlodipine Besylate) .... Take 1 Tablet By Mouth Once A Day  Allergies (verified): No Known Drug Allergies  Physical Exam  Msk:  Full ROM of left arm and shoulder.  NT over Trap, neck, clavicle, subacromial bursa.  Point tenderness where deltoid inserts on humerus   Impression & Recommendations:  Problem # 1:  ARM PAIN, LEFT (ICD-729.5) Rest--low dose NSAIDS if necessary.  Problem # 2:  Preventive Health Care (ICD-V70.0) B12 level well above normal in October--just needs to get restarted on sublingual dosing.  Complete Medication List: 1)  Lisinopril-hydrochlorothiazide 20-12.5 Mg Tabs (Lisinopril-hydrochlorothiazide) .Marland Kitchen.. 1 by mouth two times a day 2)  Ferrous Sulfate 325 (65 Fe) Mg Tbec (Ferrous sulfate) .Marland Kitchen.. 1 by mouth two times a day 3)  Loratadine 10 Mg Tabs (Loratadine) .Marland Kitchen.. 1 tab by mouth daily 4)  Ursodiol 300 Mg Caps (Ursodiol) .Marland Kitchen.. 1 by mouth two times a day 5)  Omeprazole 20 Mg Cpdr (Omeprazole) .Marland KitchenMarland KitchenMarland Kitchen  1 by mouth once daily 6)  Celebrate Bariatric Supplements  .Marland Kitchen.. 1 capsule two times a day 7)  Loestrin Fe 1.5/30 1.5-30 Mg-mcg Tabs (Norethin ace-eth estrad-fe) .Marland Kitchen.. 1 tab daily as needed--dr. Pecola Leisure at [facility] family practice for heavy bleeding. 8)  Vitamin D3 50000 Unit Caps (Cholecalciferol) .Marland Kitchen.. 1 cap by mouth weekly 9)  Protonix 40 Mg Tbec (Pantoprazole sodium) .Marland Kitchen.. 1 cap by mouth daily on empty stomach 30 minutes before breakfast. 10)  Amlodipine Besylate 5 Mg Tabs (Amlodipine besylate) .... Take 1 tablet by mouth once a day 11)  Vitamin B-12 1000 Mcg Subl (Cyanocobalamin) .Marland Kitchen.. 1 tab sublingual two times a day  Other Orders: Capillary Blood Glucose/CBG (82948) Hgb A1C  (16109UE)  Patient Instructions: 1)  No more carrying heavy stuff --use a cart. 2)  If necessary, take Ibuprofen 400mg  with food every 6 hours for the pain. 3)  See you in January   Orders Added: 1)  Capillary Blood Glucose/CBG [82948] 2)  Hgb A1C [83036QW] 3)  Est. Patient Level II [45409]     Laboratory Results   Blood Tests   Date/Time Received: December 06, 2010 4:36 PM   HGBA1C: 5.7%   (Normal Range: Non-Diabetic - 3-6%   Control Diabetic - 6-8%) CBG Random:: 113mg /dL

## 2011-01-19 NOTE — Assessment & Plan Note (Signed)
Summary: 1 MONTH FU FOR BP CHECK//KT  Nurse Visit   Vital Signs:  Patient profile:   45 year old female Menstrual status:  regular Weight:      209.4 pounds Pulse rate:   60 / minute Pulse rhythm:   regular Resp:     20 per minute BP sitting:   146 / 78  (left arm) Cuff size:   large  Vitals Entered By: Dutch Quint RN (October 03, 2010 9:49 AM)  Serial Vital Signs/Assessments:  Time      Position  BP       Pulse  Resp  Temp     By 10:17 AM            152/78                         Dutch Quint RN   Patient Instructions: 1)  Reviewed with Wende Mott 2)  Your blood pressure today is still elevated -- 152/78 3)  Restart the Amlodipine 5 mg. one tablet by mouth daily 4)  We will call you with the results of your labwork. 5)  You received your flu shot today -- please read the handout 6)  Return for blood pressure check in 2-3 weeks with triage nurse 7)  Call if anything changes or if you have any questions.   Impression & Recommendations:  Problem # 1:  HYPERTENSION (ICD-401.9)  BP still elevated -- 152/78 Restarting amlodipine 5 mg. by mouth daily BMET cone Got flu shot Return in 2-3 weeks for BP check with triage nurse  Her updated medication list for this problem includes:    Lisinopril-hydrochlorothiazide 20-12.5 Mg Tabs (Lisinopril-hydrochlorothiazide) .Marland Kitchen... 1 by mouth two times a day    Amlodipine Besylate 5 Mg Tabs (Amlodipine besylate) .Marland Kitchen... Take 1 tablet by mouth once a day  Complete Medication List: 1)  Lisinopril-hydrochlorothiazide 20-12.5 Mg Tabs (Lisinopril-hydrochlorothiazide) .Marland Kitchen.. 1 by mouth two times a day 2)  Ferrous Sulfate 325 (65 Fe) Mg Tbec (Ferrous sulfate) .Marland Kitchen.. 1 by mouth two times a day 3)  Loratadine 10 Mg Tabs (Loratadine) .Marland Kitchen.. 1 tab by mouth daily 4)  Ursodiol 300 Mg Caps (Ursodiol) .Marland Kitchen.. 1 by mouth two times a day 5)  Omeprazole 20 Mg Cpdr (Omeprazole) .Marland Kitchen.. 1 by mouth once daily 6)  Celebrate Bariatric Supplements  .Marland Kitchen.. 1 capsule two times  a day 7)  Loestrin Fe 1.5/30 1.5-30 Mg-mcg Tabs (Norethin ace-eth estrad-fe) .Marland Kitchen.. 1 tab daily as needed--dr. Pecola Leisure at [facility] family practice for heavy bleeding. 8)  Vitamin D3 50000 Unit Caps (Cholecalciferol) .Marland Kitchen.. 1 cap by mouth weekly 9)  Protonix 40 Mg Tbec (Pantoprazole sodium) .Marland Kitchen.. 1 cap by mouth daily on empty stomach 30 minutes before breakfast. 10)  Amlodipine Besylate 5 Mg Tabs (Amlodipine besylate) .... Take 1 tablet by mouth once a day  Other Orders: T-Basic Metabolic Panel [phone]) Flu Vaccine 45yrs + (14782) Admin 1st Vaccine (95621)   CC:   BP recheck and BMET and flu shot.  History of Present Illness: Took all of her meds this morning.  Denies headache, cough, did have a little dizziness yesterday.   Review of Systems CV:  Complains of lightheadness; denies bluish discoloration of lips or nails, chest pain or discomfort, difficulty breathing at night, difficulty breathing while lying down, fainting, fatigue, leg cramps with exertion, near fainting, palpitations, shortness of breath with exertion, swelling of feet, swelling of hands, and weight gain; felt a little "off-balance" yesterday.Marland Kitchen  Physical Exam  Lungs:  normal respiratory effort, normal breath sounds, no crackles, and no wheezes.   Heart:  normal rate and regular rhythm.    CC:  BP recheck, BMET and flu shot Is Patient Diabetic? Yes Did you bring your meter with you today? No Pain Assessment Patient in pain? no       Does patient need assistance? Functional Status Self care Ambulation Normal   Allergies: No Known Drug Allergies  Immunizations Administered:  Influenza Vaccine # 1:    Vaccine Type: Fluvax 3+    Site: right deltoid    Mfr: GlaxoSmithKline    Dose: 0.5 ml    Route: IM    Given by: Dutch Quint RN    Exp. Date: 06/17/2011    Lot #: [id number]    VIS given: 07/12/10 version given October 03, 2010.  Flu Vaccine Consent Questions:    Do you have a history of severe  allergic reactions to this vaccine? no    Any prior history of allergic reactions to egg and/or gelatin? no    Do you have a sensitivity to the preservative Thimersol? no    Do you have a past history of Guillan-Barre Syndrome? no    Do you currently have an acute febrile illness? no    Have you ever had a severe reaction to latex? no    Vaccine information given and explained to patient? yes    Are you currently pregnant? no  Orders Added: 1)  T-Basic Metabolic Panel [[id number]] 2)  Flu Vaccine 51yrs + [90658] 3)  Admin 1st Vaccine [90471] 4)  Est. Patient Level II [09811] Prescriptions: AMLODIPINE BESYLATE 5 MG TABS (AMLODIPINE BESYLATE) Take 1 tablet by mouth once a day  #30 x 3   Entered by:   Dutch Quint RN   Authorized by:   Tereso Newcomer PA-C   Signed by:   Dutch Quint RN on 10/03/2010   Method used:   Faxed to ...       [facility] (retail)       [address] [place], [place]  [address]       Ph: [phone]       Fax: [phone]   RxID:   [id number]

## 2011-01-19 NOTE — Letter (Signed)
Summary: REQUESTING RECORDS FROM DR.EICHORN[facility]  REQUESTING RECORDS FROM DR.EICHORN/[facility]   Imported By: Arta Bruce 01/04/2011 11:59:12  _____________________________________________________________________  External Attachment:    Type:   Image     Comment:   External Document

## 2011-01-19 NOTE — Letter (Signed)
Summary: Discharge Summary  Discharge Summary   Imported By: Arta Bruce 08/09/2010 12:16:22  _____________________________________________________________________  External Attachment:    Type:   Image     Comment:   External Document

## 2011-01-19 NOTE — Letter (Signed)
Summary: TEST ORDER FROM//[facility]//APPT DATE & TIME  TEST ORDER FROM//[facility]//APPT DATE & TIME   Imported By: Arta Bruce 02/23/2010 15:16:13  _____________________________________________________________________  External Attachment:    Type:   Image     Comment:   External Document

## 2011-01-19 NOTE — Assessment & Plan Note (Signed)
Summary: BP ISSUE, DM MED REFILLS /LR   Vital Signs:  Patient profile:   45 year old female Menstrual status:  regular LMP:     07/11/2010 Height:      65 inches Weight:      213.9 pounds Temp:     97.0 degrees F oral Pulse rate:   78 / minute Pulse rhythm:   regular Resp:     16 per minute BP sitting:   128 / 90  (left arm) Cuff size:   large  Vitals Entered By: Michelle Nasuti (September 02, 2010 3:00 PM) CC: dm and htn follow up. med refils needed Is Patient Diabetic? Yes Pain Assessment Patient in pain? no      CBG Result 65 CBG Device ID [id number]  Does patient need assistance? Ambulation Normal LMP (date): 07/11/2010     Enter LMP: 07/11/2010 Last PAP Result NEGATIVE FOR INTRAEPITHELIAL LESIONS OR MALIGNANCY.   CC:  dm and htn follow up. med refils needed.  History of Present Illness: 1.  Hypertension:  Has been off Lisinopril/HCTZ for 1 month as she did not realize she could refill.  Using Amlodipine only once weekly--last fill was in June.  2.  DM:  not taking any meds since A1C of 5.9% last September 2010.  Checks her sugars every morning.  Never higher than 90.  3.  Gastric Bypass/Bariatric Surgery:  follows up now just once yearly at [facility].  Is exercising regularly.  Would like to get rid of excess skin.  4.  Anemia:  Was having heavy periods and went to [facility] in May--was anemic when checked in January with CPP here.  Allergies (verified): No Known Drug Allergies  Physical Exam  General:  Significant weight loss!  Looks quite well Lungs:  Normal respiratory effort, chest expands symmetrically. Lungs are clear to auscultation, no crackles or wheezes. Heart:  Normal rate and regular rhythm. S1 and S2 normal without gallop, murmur, click, rub or other extra sounds.   Impression & Recommendations:  Problem # 1:  VITAMIN D DEFICIENCY (ICD-268.9)  Orders: T- * Misc. Laboratory test [phone])  Problem # 2:  DIABETES MELLITUS, CONTROLLED  (ICD-250.00) On no meds--discussed she will likely not need to check sugars so frequently Her updated medication list for this problem includes:    Lisinopril-hydrochlorothiazide 20-12.5 Mg Tabs (Lisinopril-hydrochlorothiazide) .Marland Kitchen... 1 by mouth two times a day  Orders: Capillary Blood Glucose/CBG (82948) Hemoglobin A1C (83036)  Problem # 3:  HYPERTENSION (ICD-401.9) Stop Amlodipine as not using much. Just use Lisinopril-hctz--to restart. The following medications were removed from the medication list:    Amlodipine Besylate 10 Mg Tabs (Amlodipine besylate) .Marland Kitchen... 1 tab by mouth daily Her updated medication list for this problem includes:    Lisinopril-hydrochlorothiazide 20-12.5 Mg Tabs (Lisinopril-hydrochlorothiazide) .Marland Kitchen... 1 by mouth two times a day  Problem # 4:  ANEMIA, IRON DEFICIENCY (ICD-280.9) Suspect improved with decrease in menstrual bleeds. Her updated medication list for this problem includes:    Ferrous Sulfate 325 (65 Fe) Mg Tbec (Ferrous sulfate) .Marland Kitchen... 1 by mouth two times a day  Orders: T-CBC w/Diff ([id number])  Problem # 5:  MENORRHAGIA (ICD-626.2) Will see if can get something compatible to BCPs Controlled with current regimen Her updated medication list for this problem includes:    Loestrin Fe 1.5/30 1.5-30 Mg-mcg Tabs (Norethin ace-eth estrad-fe) .Marland Kitchen... 1 tab daily as needed--dr. Pecola Leisure at [facility] family practice for heavy bleeding.  Complete Medication List: 1)  Lisinopril-hydrochlorothiazide 20-12.5 Mg Tabs (Lisinopril-hydrochlorothiazide) .Marland KitchenMarland KitchenMarland Kitchen  1 by mouth two times a day 2)  Ferrous Sulfate 325 (65 Fe) Mg Tbec (Ferrous sulfate) .Marland Kitchen.. 1 by mouth two times a day 3)  Loratadine 10 Mg Tabs (Loratadine) .Marland Kitchen.. 1 tab by mouth daily 4)  Ursodiol 300 Mg Caps (Ursodiol) .Marland Kitchen.. 1 by mouth two times a day 5)  Omeprazole 20 Mg Cpdr (Omeprazole) .Marland Kitchen.. 1 by mouth once daily 6)  Celebrate Bariatric Supplements  .Marland Kitchen.. 1 capsule two times a day 7)  Loestrin Fe 1.5/30 1.5-30  Mg-mcg Tabs (Norethin ace-eth estrad-fe) .Marland Kitchen.. 1 tab daily as needed--dr. Pecola Leisure at [facility] family practice for heavy bleeding. 8)  Vitamin D3 50000 Unit Caps (Cholecalciferol) .Marland Kitchen.. 1 cap by mouth weekly 9)  Protonix 40 Mg Tbec (Pantoprazole sodium) .Marland Kitchen.. 1 cap by mouth daily on empty stomach 30 minutes before breakfast.  Patient Instructions: 1)  Nurse visit in 1 month for bp check and BMET (v58.69) 2)  CPP with Dr. Delrae Alfred on or after 12/24/10 Prescriptions: LOESTRIN FE 1.5/30 1.5-30 MG-MCG TABS (NORETHIN ACE-ETH ESTRAD-FE) 1 tab daily as needed--Dr. Pecola Leisure at [facility] for heavy bleeding.  #1 x 5   Entered and Authorized by:   Julieanne Manson MD   Signed by:   Julieanne Manson MD on 09/02/2010   Method used:   Faxed to ...       [facility] (retail)       [address] [place], [place]  [address]       Ph: [phone]       Fax: [phone]   RxID:   [id number] PROTONIX 40 MG TBEC (PANTOPRAZOLE SODIUM) 1 cap by mouth daily on empty stomach 30 minutes before breakfast.  #30 x 11   Entered and Authorized by:   Julieanne Manson MD   Signed by:   Julieanne Manson MD on 09/02/2010   Method used:   Faxed to ...       [facility] (retail)       [address] [place], [place]  [address]       Ph: [phone]       Fax: [phone]   RxID:   [id number] VITAMIN D3 50000 UNIT CAPS (CHOLECALCIFEROL) 1 cap by mouth weekly  #4 x 6   Entered and Authorized by:   Julieanne Manson MD   Signed by:   Julieanne Manson MD on 09/02/2010   Method used:   Faxed to ...       [facility] (retail)       [address] [place], [place]  [address]       Ph: [phone]       Fax: [phone]   RxID:   [id number] LISINOPRIL-HYDROCHLOROTHIAZIDE 20-12.5 MG  TABS (LISINOPRIL-HYDROCHLOROTHIAZIDE) 1 by mouth two times a day  #60  Each x 11   Entered and Authorized by:   Julieanne Manson MD   Signed by:   Julieanne Manson MD on 09/02/2010   Method used:   Electronically to        [facility] [phone]* (retail)       [address]       [place], [place]  [address]       Ph: [phone]       Fax: [phone]   RxID:   [id number]   Laboratory Results   Blood Tests  HGBA1C: 5.2%   (Normal Range: Non-Diabetic - 3-6%   Control Diabetic - 6-8%) CBG Random:: 65

## 2011-01-19 NOTE — Assessment & Plan Note (Signed)
Summary: BLOOD PRESSURE/MC  Nurse Visit   Vital Signs:  Patient profile:   45 year old female Menstrual status:  regular Pulse rate:   46 / minute Pulse rhythm:   regular Resp:     16 per minute BP sitting:   162 / 90  (left arm) Cuff size:   regular  Vitals Entered By: Hale Drone CMA (November 30, 2010 1:02 PM) CC: Pt. did not take her BP meds. this morning. She is supposed to take one in the a.m. and one in the p.m. but usually takes both in the a.m. Pt. denies HA's, chest pains, blurry vision, or ankle swelling.   Allergies: No Known Drug Allergies  Patient Instructions: 1)  Per Lehman Prom, FNP 2)  Keep appointment with Dr. Delrae Alfred in January.  3)  Continue to take medication as directed.   Appended Document: Orders Update    Clinical Lists Changes  Orders: Added new Service order of Est. Patient Nurse visit [phone]) - Signed

## 2011-01-26 ENCOUNTER — Encounter: Payer: Self-pay | Admitting: Internal Medicine

## 2011-01-26 ENCOUNTER — Encounter (INDEPENDENT_AMBULATORY_CARE_PROVIDER_SITE_OTHER): Payer: Self-pay | Admitting: Internal Medicine

## 2011-01-26 DIAGNOSIS — L538 Other specified erythematous conditions: Secondary | ICD-10-CM | POA: Insufficient documentation

## 2011-01-26 LAB — CONVERTED CEMR LAB: Blood Glucose, Fingerstick: 59

## 2011-02-02 NOTE — Letter (Signed)
Summary: REQUESTED LAST LAB VISIT  REQUESTED LAST LAB VISIT   Imported By: Arta Bruce 01/23/2011 11:20:18  _____________________________________________________________________  External Attachment:    Type:   Image     Comment:   External Document

## 2011-02-08 NOTE — Assessment & Plan Note (Signed)
Summary: rash   Vital Signs:  Patient profile:   45 year old female Menstrual status:  irregular LMP:     01/07/2011 Weight:      204.38 pounds Temp:     97.4 degrees F oral Pulse rate:   54 / minute Pulse rhythm:   regular Resp:     20 per minute BP sitting:   140 / 80  (left arm) Cuff size:   regular  Vitals Entered By: Hale Drone CMA (January 26, 2011 12:21 PM) CC: Rash x3 weeks in the lower abd. and naval area. Begining to fade since using corn starch and cream. Itches. Feeling dizzy x1 week and energy level very low. Wants to know if we can check her thyroid.  Is Patient Diabetic? Yes Pain Assessment Patient in pain? no      CBG Result 59 CBG Device ID B non fasting  Does patient need assistance? Functional Status Self care Ambulation Normal LMP (date): 01/07/2011     Enter LMP: 01/07/2011 Last PAP Result NEGATIVE FOR INTRAEPITHELIAL LESIONS OR MALIGNANCY.   CC:  Rash x3 weeks in the lower abd. and naval area. Begining to fade since using corn starch and cream. Itches. Feeling dizzy x1 week and energy level very low. Wants to know if we can check her thyroid. Marland Kitchen  History of Present Illness: 1.  Excess skin:  pt. can get skin removal following her weight loss if deemed a medical necessity.  Has an appt. with Dr. Kelly Splinter, plastic surgery through [facility] who comes to [facility] to see if she qualifies.  2.  Has intermittent rashes in skin folds--has been using powder with cornstarch.  Discussed avoiding cornstarch once has a rash  Current Medications (verified): 1)  Lisinopril-Hydrochlorothiazide 20-12.5 Mg  Tabs (Lisinopril-Hydrochlorothiazide) .Marland Kitchen.. 1 By Mouth Two Times A Day 2)  Ferrous Sulfate 325 (65 Fe) Mg  Tbec (Ferrous Sulfate) .Marland Kitchen.. 1 By Mouth Two Times A Day 3)  Loratadine 10 Mg Tabs (Loratadine) .Marland Kitchen.. 1 Tab By Mouth Daily 4)  Ursodiol 300 Mg Caps (Ursodiol) .Marland Kitchen.. 1 By Mouth Two Times A Day 5)  Omeprazole 20 Mg Cpdr (Omeprazole) .Marland Kitchen.. 1 By Mouth Once Daily 6)   Celebrate Bariatric Supplements .Marland KitchenMarland Kitchen. 1 Capsule Two Times A Day 7)  Loestrin Fe 1.5/30 1.5-30 Mg-Mcg Tabs (Norethin Ace-Eth Estrad-Fe) .Marland Kitchen.. 1 Tab Daily As Needed--Dr. Pecola Leisure At [facility] For Heavy Bleeding. 8)  Vitamin D3 50000 Unit Caps (Cholecalciferol) .Marland Kitchen.. 1 Cap By Mouth Weekly 9)  Protonix 40 Mg Tbec (Pantoprazole Sodium) .Marland Kitchen.. 1 Cap By Mouth Daily On Empty Stomach 30 Minutes Before Breakfast. 10)  Amlodipine Besylate 5 Mg Tabs (Amlodipine Besylate) .... Take 1 Tablet By Mouth Once A Day 11)  Vitamin B-12 1000 Mcg Subl (Cyanocobalamin) .Marland Kitchen.. 1 Tab Sublingual Two Times A Day  Allergies (verified): No Known Drug Allergies  Physical Exam  General:  NAD Skin:  Significant skin folds left from weight loss.  Healing hyperpigmented areas under anterior abdominal pannus. Elliptical area of inflammation in low back skin fold.  Mildly moist  Diabetes Management Exam:    Foot Exam (with socks and/or shoes not present):       Sensory-Monofilament:          Left foot: normal          Right foot: normal   Impression & Recommendations:  Problem # 1:  INTERTRIGO (JXB-147.82) Lamisil Avoid cornstarch containing powders where develops erythema  Complete Medication List: 1)  Lisinopril-hydrochlorothiazide 20-12.5 Mg Tabs (  Lisinopril-hydrochlorothiazide) .Marland Kitchen.. 1 by mouth two times a day 2)  Ferrous Sulfate 325 (65 Fe) Mg Tbec (Ferrous sulfate) .Marland Kitchen.. 1 by mouth two times a day 3)  Loratadine 10 Mg Tabs (Loratadine) .Marland Kitchen.. 1 tab by mouth daily 4)  Ursodiol 300 Mg Caps (Ursodiol) .Marland Kitchen.. 1 by mouth two times a day 5)  Omeprazole 20 Mg Cpdr (Omeprazole) .Marland Kitchen.. 1 by mouth once daily 6)  Celebrate Bariatric Supplements  .Marland Kitchen.. 1 capsule two times a day 7)  Loestrin Fe 1.5/30 1.5-30 Mg-mcg Tabs (Norethin ace-eth estrad-fe) .Marland Kitchen.. 1 tab daily as needed--dr. Pecola Leisure at [facility] family practice for heavy bleeding. 8)  Vitamin D3 50000 Unit Caps (Cholecalciferol) .Marland Kitchen.. 1 cap by mouth weekly 9)  Protonix 40 Mg  Tbec (Pantoprazole sodium) .Marland Kitchen.. 1 cap by mouth daily on empty stomach 30 minutes before breakfast. 10)  Amlodipine Besylate 5 Mg Tabs (Amlodipine besylate) .... Take 1 tablet by mouth once a day 11)  Vitamin B-12 1000 Mcg Subl (Cyanocobalamin) .Marland Kitchen.. 1 tab sublingual two times a day 12)  Terbinafine Hcl 1 % Crea (Terbinafine hcl) .... Apply to affected areas--thin amt. two times a day  Other Orders: Capillary Blood Glucose/CBG (11914)  Patient Instructions: 1)  Continue to keep skin folds clean and dry. 2)  Stop anything with cornstarch once you develop a rash and switch to talcum powder without cornstarch 3)  Lamisil two times a day to rash area for 2 weeks or until rash resolves Prescriptions: TERBINAFINE HCL 1 % CREA (TERBINAFINE HCL) apply to affected areas--thin amt. two times a day  #60g x 0   Entered and Authorized by:   Julieanne Manson MD   Signed by:   Julieanne Manson MD on 01/26/2011   Method used:   Faxed to ...       [facility] (retail)       [address] [place], [place]  [address]       Ph: [phone]       Fax: [phone]   RxID:   [id number]    Orders Added: 1)  Capillary Blood Glucose/CBG [82948] 2)  Est. Patient Level III [74259]     Diabetic Foot Exam    10-g (5.07) Semmes-Weinstein Monofilament Test Performed by: Hale Drone CMA          Right Foot          Left Foot Visual Inspection     normal         normal Test Control      normal         normal Site 1         normal         normal Site 2         normal         normal Site 3         normal         normal Site 4         normal         normal Site 5         normal         normal Site 6         normal         normal Site 7         normal         normal Site 8  normal         normal Site 9         abnormal         abnormal Site 10         normal         normal  Impression      normal         normal

## 2011-02-13 ENCOUNTER — Encounter (INDEPENDENT_AMBULATORY_CARE_PROVIDER_SITE_OTHER): Payer: Self-pay | Admitting: Internal Medicine

## 2011-02-23 NOTE — Miscellaneous (Signed)
Summary: [facility] record review  Clinical Lists Changes  Observations: Added new observation of RXSTTSTPER: Dr. Garen Lah:  Nonreversible defect basal inferolateral and mid infero lateral and apical lateral walls.  No ECG cahnges.  Probable Breast attenuation artifact. (04/12/2010 9:01) Added new observation of ECHOCARDIOGR: Dr. Karsten Fells, [facility]--requested secondary to bradycardia:  EF 55% with moderate concentric LVH.  Normal systolic function.  Mild Tricuspid regurgitation. (04/12/2010 9:01)  Sinus bradycardia on ECG without other significant brady arrhythmias.

## 2011-02-28 NOTE — Letter (Signed)
Summary: RECEIVED RECORDS FROM DR.[facility]  RECEIVED RECORDS FROM DR.EICHORN   Imported By: Arta Bruce 02/20/2011 09:28:56  _____________________________________________________________________  External Attachment:    Type:   Image     Comment:   External Document

## 2011-05-02 NOTE — Op Note (Signed)
Amy Blair, Amy Blair              ACCOUNT NO.:  [id number]   MEDICAL RECORD NO.:  [id number]          PATIENT TYPE:  AMB   LOCATION:  DAY                          FACILITY:  [facility]   PHYSICIAN:  Erasmo Leventhal, M.D.DATE OF BIRTH:  1966/12/06   DATE OF PROCEDURE:  09/18/2008  DATE OF DISCHARGE:                               OPERATIVE REPORT   PREOPERATIVE DIAGNOSIS:  Right knee subacute quadriceps tendon rupture.   POSTOPERATIVE DIAGNOSIS:  Right knee subacute quadriceps tendon rupture.   PROCEDURE:  Right knee extensor exploration with quadriceps tendon  primary repair, irrigation of knee.   SURGEON:  Erasmo Leventhal, M.D.   ASSISTANT:  Jaquelyn Bitter. Chabon, PA-C.   ANESTHESIA:  Spinal.   DICTATION ENDED AT THIS POINT.           ______________________________  Erasmo Leventhal, M.D.     RAC/MEDQ  D:  09/18/2008  T:  09/18/2008  Job:  [id number]

## 2011-05-02 NOTE — Op Note (Signed)
NAMEOSHA, RANE              ACCOUNT NO.:  [id number]   MEDICAL RECORD NO.:  [id number]          PATIENT TYPE:  OIB   LOCATION:  1537                         FACILITY:  [facility]   PHYSICIAN:  Erasmo Leventhal, M.D.DATE OF BIRTH:  1966-02-19   DATE OF PROCEDURE:  09/18/2008  DATE OF DISCHARGE:                               OPERATIVE REPORT   PREOPERATIVE DIAGNOSIS:  Right knee subacute quadriceps tendon rupture.   POSTOPERATIVE DIAGNOSIS:  Right knee subacute quadriceps tendon rupture.   PROCEDURES:  Right knee extensor exploration, primary repair of  quadriceps rupture, knee irrigation.   SURGEON:  Valma Cava, MD.   ASSISTANT:  Jaquelyn Bitter. Chabon, PA-C.   ANESTHESIA:  General.   ESTIMATED BLOOD LOSS:  Less than 50 mL.   DRAINS:  1 Hemovac.   COMPLICATIONS:  None.   TOURNIQUET TIME:  44 minutes at 375 mmHg.   DISPOSITION:  PACU, stable.   OPERATIVE DETAILS:  The patient was counseled in the holding area.  The  correct side was identified.  She was then taken to the operating room,  placed in supine position, given general anesthesia.  A femoral nerve  block had been administered in the holding area.  IV antibiotics were  given.  All extremities were well padded with a bump.  The right leg was  elevated, prepped with DuraPrep, and draped in a sterile fashion.  She  had a very large body habitus, requiring inflation of the tourniquet to  375 mmHg.  A straight midline incision was made in the skin and  subcutaneous tissue.  Flaps were developed.  The fascia was opened.  The  quadriceps rupture was identified.  It was mobilized both on the  superior and deep surface at this at this time.  I then sharpened and  freshened the edges both proximal and distal.  Number 5 FiberWire  sutures were placed in a grasping fashion proximally and then drill  holes made in the patella for a tendon to bone direct repair after being  mobilized well without excessive tension in full  extension.  I also  irrigated the knee.  MRI scan had shown a torn lateral meniscus tear,  however, it was impossible see that through this incision.   It was doubled back on itself and resutured and a running baseball  stitch of #2 FiberWire was then placed.  Again, all wounds were  irrigated;  the knee was irrigated prior to closure.  The fascia was  closed with Vicryl, subcu Vicryl, the skin closed with staples due to  her body habitus.  A sterile dressing was applied.  Also, a drain had  been placed in the subcutaneous region distally just above the repair.   A sterile dressing was applied.  The tourniquet was deflated.  A knee  immobilizer was applied in full extension.  Normal circulation in the  foot and ankle at the end of the case.  No complications or problems.  Sponge and needle counts were correct.   Help with surgical technique, decision making, Mr. Leilani Able, PA-C,  assisted throughout the entire case.  ______________________________  Erasmo Leventhal, M.D.     RAC/MEDQ  D:  09/18/2008  T:  09/18/2008  Job:  [id number]

## 2011-09-18 LAB — COMPREHENSIVE METABOLIC PANEL
AST: 17
Alkaline Phosphatase: 85
BUN: 12
CO2: 28
Calcium: 9.1
Creatinine, Ser: 0.69
Total Bilirubin: 0.5

## 2011-09-18 LAB — DIFFERENTIAL
Basophils Absolute: 0
Basophils Relative: 0
Eosinophils Absolute: 0.1
Eosinophils Relative: 1
Lymphocytes Relative: 38
Lymphs Abs: 1.9
Monocytes Absolute: 0.5
Monocytes Relative: 11
Neutro Abs: 2.6
Neutrophils Relative %: 51

## 2011-09-18 LAB — GLUCOSE, CAPILLARY
Glucose-Capillary: 102 — ABNORMAL HIGH
Glucose-Capillary: 103 — ABNORMAL HIGH
Glucose-Capillary: 110 — ABNORMAL HIGH
Glucose-Capillary: 111 — ABNORMAL HIGH
Glucose-Capillary: 116 — ABNORMAL HIGH
Glucose-Capillary: 88
Glucose-Capillary: 90
Glucose-Capillary: 92
Glucose-Capillary: 94

## 2011-09-18 LAB — CBC
HCT: 26.4 — ABNORMAL LOW
Hemoglobin: 8.7 — ABNORMAL LOW
MCHC: 32.8
MCV: 88.9
Platelets: 400
Platelets: 370
RBC: 2.97 — ABNORMAL LOW
RDW: 16.6 — ABNORMAL HIGH
WBC: 5.1
WBC: 7.9

## 2011-09-18 LAB — URINALYSIS, ROUTINE W REFLEX MICROSCOPIC
Glucose, UA: NEGATIVE
Hgb urine dipstick: NEGATIVE
Protein, ur: NEGATIVE
Specific Gravity, Urine: 1.028
pH: 6

## 2011-09-18 LAB — CROSSMATCH

## 2011-09-18 LAB — ABO/RH: ABO/RH(D): AB POS

## 2011-09-18 LAB — HEMOGLOBIN AND HEMATOCRIT, BLOOD
HCT: 27.5 — ABNORMAL LOW
Hemoglobin: 8.9 — ABNORMAL LOW

## 2011-09-18 LAB — APTT: aPTT: 30

## 2012-03-21 ENCOUNTER — Encounter (HOSPITAL_COMMUNITY): Payer: Self-pay | Admitting: Pharmacy Technician

## 2012-03-21 NOTE — Pre-Procedure Instructions (Signed)
20 CANDICE TOBEY  03/21/2012   Your procedure is scheduled on: Fri, April 19 @ 0730 AM  Report to [facility] at 0530 AM.  Call this number if you have problems the morning of surgery: [phone]   Remember:   Do not eat food:After Midnight.  May have clear liquids: up to 4 Hours before arrival.(until 1:30 AM)  Clear liquids include soda, tea, black coffee, apple or grape juice, broth,water  Take these medicines the morning of surgery with A SIP OF WATER: Amlodipine   Do not wear jewelry, make-up or nail polish.  Do not wear lotions, powders, or perfumes.   Do not shave 48 hours prior to surgery.  Do not bring valuables to the hospital.  Contacts, dentures or bridgework may not be worn into surgery.  Leave suitcase in the car. After surgery it may be brought to your room.  For patients admitted to the hospital, checkout time is 11:00 AM the day of discharge.   Special Instructions: CHG Shower Use Special Wash: 1/2 bottle night before surgery and 1/2 bottle morning of surgery.   Please read over the following fact sheets that you were given: Pain Booklet, Coughing and Deep Breathing, MRSA Information and Surgical Site Infection Prevention

## 2012-03-22 ENCOUNTER — Encounter (HOSPITAL_COMMUNITY)
Admission: RE | Admit: 2012-03-22 | Discharge: 2012-03-22 | Disposition: A | Discharge status: Home / Self Care | Payer: Self-pay | Source: Ambulatory Visit | Attending: Specialist | Admitting: Specialist

## 2012-03-22 ENCOUNTER — Encounter (HOSPITAL_COMMUNITY)
Admission: RE | Admit: 2012-03-22 | Discharge: 2012-03-22 | Disposition: A | Discharge status: Home / Self Care | Payer: Self-pay | Source: Ambulatory Visit | Attending: Anesthesiology | Admitting: Anesthesiology

## 2012-03-22 ENCOUNTER — Other Ambulatory Visit: Payer: Self-pay

## 2012-03-22 ENCOUNTER — Encounter (HOSPITAL_COMMUNITY): Payer: Self-pay

## 2012-03-22 HISTORY — DX: Essential (primary) hypertension: I10

## 2012-03-22 HISTORY — DX: Anemia, unspecified: D64.9

## 2012-03-22 HISTORY — DX: Unspecified osteoarthritis, unspecified site: M19.90

## 2012-03-22 LAB — SURGICAL PCR SCREEN
MRSA, PCR: NEGATIVE
Staphylococcus aureus: NEGATIVE

## 2012-03-22 LAB — BASIC METABOLIC PANEL
BUN: 14 mg/dL (ref 6–23)
Calcium: 9.9 mg/dL (ref 8.4–10.5)
GFR calc Af Amer: >90 mL/min (ref >90)
GFR calc non Af Amer: >90 mL/min (ref >90)
Potassium: 3.5 mEq/L (ref 3.5–5.1)
Sodium: 140 mEq/L (ref 135–145)

## 2012-03-22 LAB — CBC
MCH: 31.7 pg (ref 26.0–34.0)
MCHC: 33.2 g/dL (ref 30.0–36.0)
RDW: 12.4 % (ref 11.5–15.5)

## 2012-04-05 ENCOUNTER — Encounter (HOSPITAL_COMMUNITY): Payer: Self-pay | Admitting: *Deleted

## 2012-04-05 ENCOUNTER — Encounter (HOSPITAL_COMMUNITY): Payer: Self-pay | Admitting: Anesthesiology

## 2012-04-05 ENCOUNTER — Encounter (HOSPITAL_COMMUNITY): Payer: Self-pay | Admitting: General Practice

## 2012-04-05 ENCOUNTER — Ambulatory Visit (HOSPITAL_COMMUNITY): Payer: Self-pay | Admitting: Anesthesiology

## 2012-04-05 ENCOUNTER — Encounter (HOSPITAL_COMMUNITY)
Admission: RE | Disposition: A | Discharge status: Home / Self Care | Payer: Self-pay | Source: Ambulatory Visit | Attending: Specialist

## 2012-04-05 ENCOUNTER — Ambulatory Visit (HOSPITAL_COMMUNITY)
Admission: RE | Admit: 2012-04-05 | Discharge: 2012-04-06 | Disposition: A | Discharge status: Home / Self Care | Payer: Self-pay | Source: Ambulatory Visit | Attending: Specialist | Admitting: Specialist

## 2012-04-05 DIAGNOSIS — Z01818 Encounter for other preprocedural examination: Secondary | ICD-10-CM | POA: Insufficient documentation

## 2012-04-05 DIAGNOSIS — I10 Essential (primary) hypertension: Secondary | ICD-10-CM | POA: Insufficient documentation

## 2012-04-05 DIAGNOSIS — E119 Type 2 diabetes mellitus without complications: Secondary | ICD-10-CM | POA: Insufficient documentation

## 2012-04-05 DIAGNOSIS — Z01812 Encounter for preprocedural laboratory examination: Secondary | ICD-10-CM | POA: Insufficient documentation

## 2012-04-05 DIAGNOSIS — I359 Nonrheumatic aortic valve disorder, unspecified: Secondary | ICD-10-CM | POA: Insufficient documentation

## 2012-04-05 DIAGNOSIS — K429 Umbilical hernia without obstruction or gangrene: Principal | ICD-10-CM | POA: Insufficient documentation

## 2012-04-05 DIAGNOSIS — L538 Other specified erythematous conditions: Secondary | ICD-10-CM | POA: Insufficient documentation

## 2012-04-05 DIAGNOSIS — Z9884 Bariatric surgery status: Secondary | ICD-10-CM | POA: Insufficient documentation

## 2012-04-05 DIAGNOSIS — R634 Abnormal weight loss: Secondary | ICD-10-CM | POA: Insufficient documentation

## 2012-04-05 DIAGNOSIS — M6200 Separation of muscle (nontraumatic), unspecified site: Secondary | ICD-10-CM | POA: Insufficient documentation

## 2012-04-05 DIAGNOSIS — Z01810 Encounter for preprocedural cardiovascular examination: Secondary | ICD-10-CM | POA: Insufficient documentation

## 2012-04-05 HISTORY — PX: UMBILICAL HERNIA REPAIR: SHX196

## 2012-04-05 HISTORY — PX: PANNICULECTOMY: SHX5360

## 2012-04-05 HISTORY — PX: LIPOSUCTION: SHX10

## 2012-04-05 HISTORY — PX: OTHER SURGICAL HISTORY: SHX169

## 2012-04-05 HISTORY — DX: Cardiac murmur, unspecified: R01.1

## 2012-04-05 SURGERY — PANNICULECTOMY
Anesthesia: General | Site: Abdomen | Wound class: Clean

## 2012-04-05 MED ORDER — CEFAZOLIN SODIUM 1-5 GM-% IV SOLN
1.0000 g | Freq: Three times a day (TID) | INTRAVENOUS | Status: AC
  Administered 2012-04-05 – 2012-04-06 (×3): 1 g via INTRAVENOUS
  Filled 2012-04-05 (×3): qty 50

## 2012-04-05 MED ORDER — WHITE PETROLATUM GEL
Status: AC
  Administered 2012-04-05: 14:00:00
  Filled 2012-04-05: qty 5

## 2012-04-05 MED ORDER — OXYCODONE-ACETAMINOPHEN 5-325 MG PO TABS: 2.0000 | ORAL_TABLET | ORAL | Status: DC | PRN

## 2012-04-05 MED ORDER — OXYCODONE-ACETAMINOPHEN 5-325 MG PO TABS
2.0000 | ORAL_TABLET | ORAL | Status: DC | PRN
  Administered 2012-04-05 – 2012-04-06 (×8): 2 via ORAL
  Filled 2012-04-05 (×8): qty 2
  Filled 2012-04-05: qty 1

## 2012-04-05 MED ORDER — HYDROMORPHONE HCL PF 1 MG/ML IJ SOLN
0.2500 mg | INTRAMUSCULAR | Status: DC | PRN
  Administered 2012-04-05 (×2): 0.5 mg via INTRAVENOUS

## 2012-04-05 MED ORDER — SODIUM CHLORIDE 0.9 % IV SOLN
12.5000 mg | Freq: Once | INTRAVENOUS | Status: DC
  Filled 2012-04-05: qty 0.5

## 2012-04-05 MED ORDER — DEXTROSE IN LACTATED RINGERS 5 % IV SOLN
INTRAVENOUS | Status: DC
  Administered 2012-04-05 – 2012-04-06 (×3): via INTRAVENOUS

## 2012-04-05 MED ORDER — CEFAZOLIN SODIUM 1-5 GM-% IV SOLN
INTRAVENOUS | Status: AC
  Filled 2012-04-05: qty 50

## 2012-04-05 MED ORDER — ONDANSETRON HCL 4 MG/2ML IJ SOLN
INTRAMUSCULAR | Status: DC | PRN
  Administered 2012-04-05: 4 mg via INTRAVENOUS

## 2012-04-05 MED ORDER — SODIUM BICARBONATE 4 % IV SOLN
INTRAVENOUS | Status: AC
  Administered 2012-04-05: 10:00:00 via INTRAMUSCULAR
  Filled 2012-04-05: qty 50

## 2012-04-05 MED ORDER — DEXAMETHASONE SODIUM PHOSPHATE 4 MG/ML IJ SOLN
INTRAMUSCULAR | Status: DC | PRN
  Administered 2012-04-05: 8 mg via INTRAVENOUS

## 2012-04-05 MED ORDER — SODIUM BICARBONATE 4 % IV SOLN
Freq: Once | INTRAVENOUS | Status: DC
  Filled 2012-04-05: qty 50

## 2012-04-05 MED ORDER — GLYCOPYRROLATE 0.2 MG/ML IJ SOLN
INTRAMUSCULAR | Status: DC | PRN
  Administered 2012-04-05: .6 mg via INTRAVENOUS

## 2012-04-05 MED ORDER — LIDOCAINE HCL (CARDIAC) 20 MG/ML IV SOLN
INTRAVENOUS | Status: DC | PRN
  Administered 2012-04-05: 50 mg via INTRAVENOUS

## 2012-04-05 MED ORDER — FENTANYL CITRATE 0.05 MG/ML IJ SOLN
INTRAMUSCULAR | Status: DC | PRN
  Administered 2012-04-05: 50 ug via INTRAVENOUS
  Administered 2012-04-05: 150 ug via INTRAVENOUS
  Administered 2012-04-05 (×4): 50 ug via INTRAVENOUS

## 2012-04-05 MED ORDER — MIDAZOLAM HCL 5 MG/5ML IJ SOLN
INTRAMUSCULAR | Status: DC | PRN
  Administered 2012-04-05: 2 mg via INTRAVENOUS

## 2012-04-05 MED ORDER — PROMETHAZINE HCL 25 MG/ML IJ SOLN: 25.0000 mg | Freq: Four times a day (QID) | INTRAMUSCULAR | Status: DC | PRN

## 2012-04-05 MED ORDER — NEOSTIGMINE METHYLSULFATE 1 MG/ML IJ SOLN
INTRAMUSCULAR | Status: DC | PRN
  Administered 2012-04-05: 4 mg via INTRAVENOUS

## 2012-04-05 MED ORDER — AMLODIPINE BESYLATE 5 MG PO TABS
5.0000 mg | ORAL_TABLET | Freq: Every day | ORAL | Status: DC
  Filled 2012-04-05: qty 1

## 2012-04-05 MED ORDER — CEFAZOLIN SODIUM 1-5 GM-% IV SOLN
INTRAVENOUS | Status: DC | PRN
  Administered 2012-04-05: 1 g via INTRAVENOUS

## 2012-04-05 MED ORDER — LIDOCAINE HCL 1 % IJ SOLN
Freq: Once | INTRAVENOUS | Status: DC
  Filled 2012-04-05: qty 50

## 2012-04-05 MED ORDER — ROCURONIUM BROMIDE 100 MG/10ML IV SOLN
INTRAVENOUS | Status: DC | PRN
  Administered 2012-04-05: 10 mg via INTRAVENOUS
  Administered 2012-04-05: 20 mg via INTRAVENOUS
  Administered 2012-04-05: 10 mg via INTRAVENOUS
  Administered 2012-04-05: 50 mg via INTRAVENOUS

## 2012-04-05 MED ORDER — 0.9 % SODIUM CHLORIDE (POUR BTL) OPTIME
TOPICAL | Status: DC | PRN
  Administered 2012-04-05: 2000 mL

## 2012-04-05 MED ORDER — CEFAZOLIN SODIUM 1-5 GM-% IV SOLN: 1.0000 g | Freq: Three times a day (TID) | INTRAVENOUS | Status: DC

## 2012-04-05 MED ORDER — CEFAZOLIN SODIUM 1-5 GM-% IV SOLN: 1.0000 g | INTRAVENOUS | Status: DC

## 2012-04-05 MED ORDER — PROPOFOL 10 MG/ML IV EMUL
INTRAVENOUS | Status: DC | PRN
  Administered 2012-04-05: 130 mg via INTRAVENOUS

## 2012-04-05 MED ORDER — CEFAZOLIN SODIUM 1-5 GM-% IV SOLN
1.0000 g | Freq: Three times a day (TID) | INTRAVENOUS | Status: DC
  Filled 2012-04-05 (×2): qty 50

## 2012-04-05 MED ORDER — LACTATED RINGERS IV SOLN
INTRAVENOUS | Status: DC | PRN
  Administered 2012-04-05 (×3): via INTRAVENOUS

## 2012-04-05 SURGICAL SUPPLY — 56 items
ASPIRATION TUBING SET SINGLE USE ×3 IMPLANT
ATCH SMKEVC FLXB CAUT HNDSWH (FILTER) ×2 IMPLANT
BAG DECANTER FOR FLEXI CONT (MISCELLANEOUS) ×3 IMPLANT
BENZOIN TINCTURE PRP APPL 2/3 (GAUZE/BANDAGES/DRESSINGS) ×6 IMPLANT
BLADE KNIFE  20 PERSONNA (BLADE) ×4
BLADE KNIFE 20 PERSONNA (BLADE) ×8 IMPLANT
BLADE KNIFE PERSONA 15 (BLADE) ×6 IMPLANT
CLOTH BEACON ORANGE TIMEOUT ST (SAFETY) ×3 IMPLANT
CONNECTOR 5 IN 1 STRAIGHT STRL (MISCELLANEOUS) ×3 IMPLANT
COVER SURGICAL LIGHT HANDLE (MISCELLANEOUS) ×3 IMPLANT
DRAIN CHANNEL 10F 3/8 F FF (DRAIN) ×6 IMPLANT
DRAPE LAPAROSCOPIC ABDOMINAL (DRAPES) ×3 IMPLANT
DRSG PAD ABDOMINAL 8X10 ST (GAUZE/BANDAGES/DRESSINGS) ×6 IMPLANT
ELECT CAUTERY BLADE 6.4 (BLADE) ×3 IMPLANT
ELECT REM PT RETURN 9FT ADLT (ELECTROSURGICAL) ×3
ELECTRODE REM PT RTRN 9FT ADLT (ELECTROSURGICAL) ×2 IMPLANT
EVACUATOR PREFILTER SMOKE (MISCELLANEOUS) ×3 IMPLANT
EVACUATOR SILICONE 100CC (DRAIN) ×6 IMPLANT
EVACUATOR SMOKE ACCUVAC VALLEY (FILTER) ×1
GAUZE XEROFORM 5X9 LF (GAUZE/BANDAGES/DRESSINGS) ×6 IMPLANT
GLOVE BIO SURGEON STRL SZ7 (GLOVE) ×3 IMPLANT
GLOVE BIO SURGEON STRL SZ7.5 (GLOVE) ×3 IMPLANT
GLOVE BIOGEL PI IND STRL 7.0 (GLOVE) ×4 IMPLANT
GLOVE BIOGEL PI IND STRL 7.5 (GLOVE) ×2 IMPLANT
GLOVE BIOGEL PI INDICATOR 7.0 (GLOVE) ×2
GLOVE BIOGEL PI INDICATOR 7.5 (GLOVE) ×1
GLOVE ECLIPSE 7.0 STRL STRAW (GLOVE) ×6 IMPLANT
GLOVE SS N UNI LF 6.5 STRL (GLOVE) ×6 IMPLANT
GOWN STRL NON-REIN LRG LVL3 (GOWN DISPOSABLE) ×9 IMPLANT
KIT BASIN OR (CUSTOM PROCEDURE TRAY) ×3 IMPLANT
KIT ROOM TURNOVER OR (KITS) ×3 IMPLANT
NEEDLE SPNL 18GX3.5 QUINCKE PK (NEEDLE) ×12 IMPLANT
NS IRRIG 1000ML POUR BTL (IV SOLUTION) ×6 IMPLANT
PACK GENERAL/GYN (CUSTOM PROCEDURE TRAY) ×3 IMPLANT
PAD ARMBOARD 7.5X6 YLW CONV (MISCELLANEOUS) ×6 IMPLANT
PEN SKIN MARKING BROAD (MISCELLANEOUS) ×3 IMPLANT
PIN SAFETY STERILE (MISCELLANEOUS) ×3 IMPLANT
PREFILTER EVAC NS 1 1/3-3/8IN (MISCELLANEOUS) ×3 IMPLANT
PREFILTER SMOKE EVAC (FILTER) ×3 IMPLANT
SPECIMEN JAR LARGE (MISCELLANEOUS) ×6 IMPLANT
SPONGE GAUZE 4X4 12PLY (GAUZE/BANDAGES/DRESSINGS) ×6 IMPLANT
SPONGE LAP 18X18 X RAY DECT (DISPOSABLE) ×12 IMPLANT
STRIP CLOSURE SKIN 1/2X4 (GAUZE/BANDAGES/DRESSINGS) ×6 IMPLANT
SUT ETHILON 3 0 PS 1 (SUTURE) ×9 IMPLANT
SUT MNCRL AB 3-0 PS2 18 (SUTURE) ×12 IMPLANT
SUT MON AB 5-0 PS2 18 (SUTURE) ×12 IMPLANT
SUT PROLENE 1 XLH (SUTURE) ×6 IMPLANT
SUT PROLENE 3 0 PS 1 (SUTURE) ×12 IMPLANT
SUT SURGILON NYL 2 0 T19 M (SUTURE) ×3 IMPLANT
SYR 50ML SLIP (SYRINGE) ×6 IMPLANT
TAPE CLOTH SURG 6X10 WHT LF (GAUZE/BANDAGES/DRESSINGS) ×3 IMPLANT
TOWEL OR 17X24 6PK STRL BLUE (TOWEL DISPOSABLE) ×3 IMPLANT
TOWEL OR 17X26 10 PK STRL BLUE (TOWEL DISPOSABLE) ×6 IMPLANT
TRAY FOLEY CATH 14FRSI W/METER (CATHETERS) ×3 IMPLANT
TUBING 1/4  WITH 7/8  ADAPTER (MISCELLANEOUS) ×3 IMPLANT
WATER STERILE IRR 1000ML POUR (IV SOLUTION) ×6 IMPLANT

## 2012-04-05 NOTE — Brief Op Note (Signed)
04/05/2012  10:52 AM  PATIENT:  [name]  46 y.o. female  PRE-OPERATIVE DIAGNOSIS:  PANNICULlus  POST-OPERATIVE DIAGNOSIS:  PANNICULus  PROCEDURE:  Procedure(s) (LRB): PANNICULECTOMY (N/A) LIPOSUCTION (N/A) HERNIA REPAIR UMBILICAL ADULT (N/A)  [place]      PHYSICIAN ASSISTANT:   ASSISTANTS: none   ANESTHESIA:   general  EBL:  Total I/O In: 2000 [I.V.:2000] Out: 325 [Urine:175; Blood:150]  BLOOD ADMINISTERED:none  DRAINS: (lower pubic area) Hemovact drain(s) in the lower pubic area with  Suction Open   LOCAL MEDICATIONS USED:  LIDOCAINE   SPECIMEN:  Excision  DISPOSITION OF SPECIMEN:  PATHOLOGY  COUNTS:  yes  TOURNIQUET:  none  DICTATION: .Other Dictation: Dictation Number [id number]  PLAN OF CARE: Admit for overnight observation  PATIENT DISPOSITION:  PACU - hemodynamically stable.   Delay start of Pharmacological VTE agent (>24hrs) due to surgical blood loss or risk of bleeding: not applicable
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
## 2012-04-05 NOTE — Progress Notes (Signed)
UR complete 

## 2012-04-05 NOTE — Anesthesia Postprocedure Evaluation (Signed)
  Anesthesia Post-op Note  Patient: Amy Blair  Procedure(s) Performed: Procedure(s) (LRB): PANNICULECTOMY (N/A) LIPOSUCTION (N/A) HERNIA REPAIR UMBILICAL ADULT (N/A)  Patient Location: PACU  Anesthesia Type: General  Level of Consciousness: awake and alert   Airway and Oxygen Therapy: Patient Spontanous Breathing and Patient connected to nasal cannula oxygen  Post-op Pain: mild  Post-op Assessment: Post-op Vital signs reviewed, Patient's Cardiovascular Status Stable, Respiratory Function Stable, Patent Airway and No signs of Nausea or vomiting  Post-op Vital Signs: Reviewed and stable  Complications: No apparent anesthesia complications

## 2012-04-05 NOTE — Anesthesia Preprocedure Evaluation (Addendum)
Anesthesia Evaluation  Patient identified by MRN, date of birth, ID band Patient awake    Reviewed: Allergy & Precautions, H&P , NPO status , Patient's Chart, lab work & pertinent test results, reviewed documented beta blocker date and time   History of Anesthesia Complications Negative for: history of anesthetic complications  Airway Mallampati: II TM Distance: >3 FB Neck ROM: Full    Dental  (+) Teeth Intact, Partial Upper and Dental Advisory Given   Pulmonary neg pulmonary ROS,    Pulmonary exam normal       Cardiovascular hypertension, Pt. on medications - dysrhythmias + Valvular Problems/Murmurs AS Rhythm:Regular Rate:Normal     Neuro/Psych negative neurological ROS  negative psych ROS   GI/Hepatic negative GI ROS, Neg liver ROS,   Endo/Other  negative endocrine ROSDiabetes mellitus-  Renal/GU negative Renal ROS  negative genitourinary   Musculoskeletal negative musculoskeletal ROS (+)   Abdominal (+)  Abdomen: soft. Bowel sounds: normal.  Peds negative pediatric ROS (+)  Hematology negative hematology ROS (+)   Anesthesia Other Findings   Reproductive/Obstetrics negative OB ROS                          Anesthesia Physical Anesthesia Plan  ASA: II  Anesthesia Plan: General   Post-op Pain Management:    Induction: Intravenous  Airway Management Planned: Oral ETT  Additional Equipment:   Intra-op Plan:   Post-operative Plan: Extubation in OR  Informed Consent:   Dental advisory given  Plan Discussed with: Anesthesiologist  Anesthesia Plan Comments:         Anesthesia Quick Evaluation

## 2012-04-05 NOTE — Discharge Instructions (Signed)
At home keep in jacknife position  Ice packs to abdomen  No lifting  No driving  Take all meds as prescribedActivity (include date of return to work if known) As tolerated: NO showers NO driving No heavy activities  Diet:regular No restrictions:  Wound Care: Keep dressing clean & dry  Don not change dressings For Abdominoplasties wear abdominal binder Special Instructions: Do not raise arms up Continue to empty, recharge, & record drainage from J-P drains &/or Hemovacs 2-3 times a day, as needed. Call Doctor if any unusual problems occur such as pain, excessive Bleeding, unrelieved Nausea/vomiting, Fever &/or chills When lying down, keep head elevated on 2-3 pillows or back-rest For Addominoplasties the Jack-knife position Follow-up appointment: Please call the office.  The patient received discharge instruction from:___________________________________________      Patient signature ________________________________________ / Date___________    Signature of individual providing instructions/ Date________________             Activity (include date of return to work if known) As tolerated: NO showers NO driving No heavy activities  Diet:regular No restrictions:  Wound Care: Keep dressing clean & dry  Don not change dressings For Abdominoplasties wear abdominal binder Special Instructions: Do not raise arms up Continue to empty, recharge, & record drainage from J-P drains &/or Hemovacs 2-3 times a day, as needed. Call Doctor if any unusual problems occur such as pain, excessive Bleeding, unrelieved Nausea/vomiting, Fever &/or chills When lying down, keep head elevated on 2-3 pillows or back-rest For Addominoplasties the Jack-knife position Follow-up appointment: Please call the office.  The patient received discharge instruction from:___________________________________________      Patient signature ________________________________________ /  Date___________    Signature of individual providing instructions/ Date________________             Activity (include date of return to work if known) As tolerated: NO showers NO driving No heavy activities  Diet:regular No restrictions:  Wound Care: Keep dressing clean & dry  Don not change dressings For Abdominoplasties wear abdominal binder Special Instructions: Do not raise arms up Continue to empty, recharge, & record drainage from J-P drains &/or Hemovacs 2-3 times a day, as needed. Call Doctor if any unusual problems occur such as pain, excessive Bleeding, unrelieved Nausea/vomiting, Fever &/or chills When lying down, keep head elevated on 2-3 pillows or back-rest For Addominoplasties the Jack-knife position Follow-up appointment: Please call the office.  The patient received discharge instruction from:___________________________________________      Patient signature ________________________________________ / Date___________    Signature of individual providing instructions/ Date________________

## 2012-04-05 NOTE — Transfer of Care (Signed)
Immediate Anesthesia Transfer of Care Note  Patient: Amy Blair  Procedure(s) Performed: Procedure(s) (LRB): PANNICULECTOMY (N/A) LIPOSUCTION (N/A) HERNIA REPAIR UMBILICAL ADULT (N/A)  Patient Location: PACU  Anesthesia Type: General  Level of Consciousness: awake, alert  and oriented  Airway & Oxygen Therapy: Patient Spontanous Breathing and Patient connected to nasal cannula oxygen  Post-op Assessment: Report given to PACU RN  Post vital signs: Reviewed and stable  Complications: No apparent anesthesia complications

## 2012-04-05 NOTE — Preoperative (Signed)
Beta Blockers   Reason not to administer Beta Blockers:Not Applicable 

## 2012-04-05 NOTE — Progress Notes (Signed)
No orders from surgeon paged without response will send to holding for consent to be signed in holding

## 2012-04-06 LAB — CBC
Hemoglobin: 9.4 g/dL — ABNORMAL LOW (ref 12.0–15.0)
MCH: 31.8 pg (ref 26.0–34.0)
MCV: 97 fL (ref 78.0–100.0)
RBC: 2.96 MIL/uL — ABNORMAL LOW (ref 3.87–5.11)
WBC: 10.9 10*3/uL — ABNORMAL HIGH (ref 4.0–10.5)

## 2012-04-06 MED ORDER — DEXTROSE 5 % IN LACTATED RINGERS IV BOLUS
300.0000 mL | Freq: Once | INTRAVENOUS | Status: AC
  Administered 2012-04-06: 300 mL via INTRAVENOUS

## 2012-04-06 NOTE — Op Note (Signed)
Amy Blair, Amy Blair              ACCOUNT NO.:  [id number]  MEDICAL RECORD NO.:  [id number]  LOCATION:  5126                         FACILITY:  [facility]  PHYSICIAN:  Earvin Hansen L. Yareli Carthen, M.D.DATE OF BIRTH:  06-05-1966  DATE OF PROCEDURE:  04/05/2012 DATE OF DISCHARGE:                              OPERATIVE REPORT   This is a 46 year old lady who has a loss of 183 pounds, which has caused folliculitis in the past, rashes and intertriginous changes.  She also has some pain at the right and left periumbilical regions. we are going to explore that area for increased potential herniation, also has 4+ diastasis recti.  PROCEDURES DONE:  Panniculectomy, exploration and repair of large right periumbilical hernia and removal of excess and repair of diastasis recti.  ANESTHESIA:  General.  Preoperatively, the patient was set up and drawn for the drawings of the lower abdomen.  She underwent general anesthesia, intubated orally. Foley catheter was placed, and then prepped done to the abdominal areas and thighs with Hibiclens soap and solution walled off with sterile towels and drapes so as to make a sterile field.  Next, tumescent solution was injected locally of 1.5 L throughout the areas to allow vasoconstriction.  The incisions were opened with a 15 blade and the Bovie was used on coagulation and carried down through the skin and subcutaneous tissue.  Scarpa's fascia to the underlying areolar tissue overlying the fascia.  This was dissected outward towards the umbilical area.  Just under the umbilical area, was increased bulging.  I was able to carefully dissect around it to free the umbilicus up until we get above it.  The area showing the herniation of the right periumbilical region.  The sac was removed and the area was imbricating and pants-over- vest repair was done with 2-0 Surgilon sutures.  Next, dissection was carried all way up to the xiphoid process out laterally.  Hemostasis  was maintained with the Bovie anticoagulation.  The patient demonstrates quite a bit of 4+ diastasis recti and that was then repaired with a running #1 Prolene suture from the xiphoid process to the umbilicus and from the umbilicus to the suprapubic area.  Next, spacers placed in jack- knife position and large amounts of excess skin was then removed approximately of 5 pounds per piece. After this and after proper hemostasis, the flaps were placed in position and secured with 2-0 Monocryl sutures in subcutaneous plane x2, and a running subcuticular stitch of 3-0 Monocryl.  Steri-Strips, __________ staples were applied. New opening was made from the umbilicus, and was brought out through and secured with 3-0 Prolene.  The wounds were cleansed.  Steri-Strips and soft dressing were applied including Xeroform, 4x4s, ABDs, Hypafix tape, abdominal support, and a large Hemovac drain which was brought out through the pubic area, secured with 3-0 nylon.  She withstood the procedure very well.  ESTIMATED BLOOD LOSS:  Less than 200 mL.  COMPLICATIONS:  None.  She was then taken to recovery in good condition.  She will be overnight stay.     Yaakov Guthrie. Shon Hough, M.D.     Cathie Hoops  D:  04/05/2012  T:  04/05/2012  Job:  [id number]

## 2012-04-11 ENCOUNTER — Encounter (HOSPITAL_COMMUNITY): Payer: Self-pay | Admitting: Specialist

## 2014-10-08 ENCOUNTER — Emergency Department (HOSPITAL_COMMUNITY): Payer: No Typology Code available for payment source

## 2014-10-08 ENCOUNTER — Emergency Department (INDEPENDENT_AMBULATORY_CARE_PROVIDER_SITE_OTHER)
Admission: EM | Admit: 2014-10-08 | Discharge: 2014-10-10 | Disposition: A | Discharge status: Home / Self Care | Payer: Self-pay | Source: Home / Self Care | Attending: Emergency Medicine | Admitting: Emergency Medicine

## 2014-10-08 ENCOUNTER — Encounter (HOSPITAL_COMMUNITY): Payer: Self-pay | Admitting: Emergency Medicine

## 2014-10-08 ENCOUNTER — Emergency Department (HOSPITAL_COMMUNITY)
Admission: EM | Admit: 2014-10-08 | Discharge: 2014-10-09 | Disposition: A | Discharge status: Home / Self Care | Payer: No Typology Code available for payment source | Attending: Emergency Medicine | Admitting: Emergency Medicine

## 2014-10-08 DIAGNOSIS — I10 Essential (primary) hypertension: Secondary | ICD-10-CM | POA: Insufficient documentation

## 2014-10-08 DIAGNOSIS — D649 Anemia, unspecified: Secondary | ICD-10-CM | POA: Diagnosis not present

## 2014-10-08 DIAGNOSIS — S0990XA Unspecified injury of head, initial encounter: Secondary | ICD-10-CM | POA: Diagnosis not present

## 2014-10-08 DIAGNOSIS — Z79899 Other long term (current) drug therapy: Secondary | ICD-10-CM | POA: Diagnosis not present

## 2014-10-08 DIAGNOSIS — R51 Headache: Secondary | ICD-10-CM

## 2014-10-08 DIAGNOSIS — R011 Cardiac murmur, unspecified: Secondary | ICD-10-CM | POA: Insufficient documentation

## 2014-10-08 DIAGNOSIS — S060X0A Concussion without loss of consciousness, initial encounter: Secondary | ICD-10-CM

## 2014-10-08 DIAGNOSIS — Y9389 Activity, other specified: Secondary | ICD-10-CM | POA: Diagnosis not present

## 2014-10-08 DIAGNOSIS — Z8739 Personal history of other diseases of the musculoskeletal system and connective tissue: Secondary | ICD-10-CM | POA: Diagnosis not present

## 2014-10-08 DIAGNOSIS — G44319 Acute post-traumatic headache, not intractable: Secondary | ICD-10-CM

## 2014-10-08 DIAGNOSIS — Y92410 Unspecified street and highway as the place of occurrence of the external cause: Secondary | ICD-10-CM | POA: Insufficient documentation

## 2014-10-08 DIAGNOSIS — R519 Headache, unspecified: Secondary | ICD-10-CM

## 2014-10-08 DIAGNOSIS — R270 Ataxia, unspecified: Secondary | ICD-10-CM

## 2014-10-08 LAB — CBC
HCT: 33.1 % — ABNORMAL LOW (ref 36.0–46.0)
Hemoglobin: 10.5 g/dL — ABNORMAL LOW (ref 12.0–15.0)
MCH: 30.4 pg (ref 26.0–34.0)
MCHC: 31.7 g/dL (ref 30.0–36.0)
MCV: 95.9 fL (ref 78.0–100.0)
PLATELETS: 340 10*3/uL (ref 150–400)
RBC: 3.45 MIL/uL — AB (ref 3.87–5.11)
RDW: 12.7 % (ref 11.5–15.5)
WBC: 4.4 10*3/uL (ref 4.0–10.5)

## 2014-10-08 LAB — BASIC METABOLIC PANEL
Anion gap: 11 (ref 5–15)
BUN: 20 mg/dL (ref 6–23)
CALCIUM: 9.4 mg/dL (ref 8.4–10.5)
CO2: 27 mEq/L (ref 19–32)
Chloride: 101 mEq/L (ref 96–112)
Creatinine, Ser: 0.74 mg/dL (ref 0.50–1.10)
GFR calc Af Amer: >90 mL/min (ref >90)
GLUCOSE: 85 mg/dL (ref 70–99)
Potassium: 3.7 mEq/L (ref 3.7–5.3)
SODIUM: 139 meq/L (ref 137–147)

## 2014-10-08 LAB — PROTIME-INR
INR: 0.98 (ref 0.00–1.49)
PROTHROMBIN TIME: 13.1 s (ref 11.6–15.2)

## 2014-10-08 LAB — APTT: aPTT: 33 seconds (ref 24–37)

## 2014-10-08 MED ORDER — OXYCODONE-ACETAMINOPHEN 5-325 MG PO TABS
2.0000 | ORAL_TABLET | Freq: Once | ORAL | Status: AC
  Administered 2014-10-08: 2 via ORAL
  Filled 2014-10-08: qty 2
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
## 2014-10-08 NOTE — ED Notes (Signed)
Pt signed consent for lumbar puncture.

## 2014-10-09 ENCOUNTER — Emergency Department (HOSPITAL_COMMUNITY): Payer: No Typology Code available for payment source

## 2014-10-09 DIAGNOSIS — S060X0A Concussion without loss of consciousness, initial encounter: Secondary | ICD-10-CM

## 2014-10-09 LAB — CSF CELL COUNT WITH DIFFERENTIAL
EOS CSF: NONE SEEN % (ref 0–1)
Eosinophils, CSF: NONE SEEN % (ref 0–1)
Monocyte-Macrophage-Spinal Fluid: NONE SEEN % (ref 15–45)
RBC COUNT CSF: 223 /mm3 — AB
RBC Count, CSF: 48 /mm3 — ABNORMAL HIGH
SEGMENTED NEUTROPHILS-CSF: NONE SEEN % (ref 0–6)
Tube #: 1
Tube #: 4
WBC, CSF: 0 /mm3 (ref 0–5)
WBC, CSF: 1 /mm3 (ref 0–5)

## 2014-10-09 LAB — GRAM STAIN

## 2014-10-09 MED ORDER — HYDROCODONE-ACETAMINOPHEN 5-325 MG PO TABS
1.0000 | ORAL_TABLET | Freq: Once | ORAL | Status: AC
  Administered 2014-10-09: 1 via ORAL
  Filled 2014-10-09: qty 1

## 2014-10-09 NOTE — Discharge Instructions (Signed)

## 2014-10-09 NOTE — ED Provider Notes (Signed)
LP results not consistent with subarachnoid hemorrhage. Supernatant clear.  RBCs 223 tube 1, tube 4 38. Neurologically intact.  Alert and oriented. Tolerating PO.  Will discharge with head injury precautions.  Glynn OctaveStephen Carmyn Hamm, MD 10/09/14 [phone]

## 2014-10-09 NOTE — Procedures (Signed)
Procedure: Fluoro guided LP at L2-3. Specimen: CSF, to lab Bleeding: minimal. Complications: None immediate. Patient   -Condition: Stable.  -Disposition:  Return to ED.  Full Radiology Report to Follow
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
## 2014-10-09 NOTE — Discharge Instructions (Signed)
We have determined that your problem requires further evaluation in the emergency department.  We will take care of your transport there.  Once at the emergency department, you will be evaluated by a provider and they will order whatever treatment or tests they deem necessary.  We cannot guarantee that they will do any specific test or do any specific treatment.  ° °
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
## 2014-10-11 LAB — HERPES SIMPLEX VIRUS(HSV) DNA BY PCR
HSV 1 DNA: NOT DETECTED
HSV 2 DNA: NOT DETECTED

## 2014-10-12 LAB — CSF CULTURE W GRAM STAIN
Culture: NO GROWTH
Gram Stain: NONE SEEN

## 2015-03-18 ENCOUNTER — Encounter: Payer: Self-pay | Admitting: *Deleted

## 2015-03-31 ENCOUNTER — Ambulatory Visit: Payer: 59 | Admitting: Family Medicine

## 2015-04-07 ENCOUNTER — Ambulatory Visit (INDEPENDENT_AMBULATORY_CARE_PROVIDER_SITE_OTHER): Payer: 59 | Admitting: Family Medicine

## 2015-04-07 ENCOUNTER — Encounter: Payer: Self-pay | Admitting: Family Medicine

## 2015-04-07 VITALS — BP 128/68 | HR 78 | Temp 98.4°F | Resp 12 | Ht 67.0 in | Wt 221.0 lb

## 2015-04-07 DIAGNOSIS — E559 Vitamin D deficiency, unspecified: Secondary | ICD-10-CM | POA: Diagnosis not present

## 2015-04-07 DIAGNOSIS — E669 Obesity, unspecified: Secondary | ICD-10-CM | POA: Diagnosis not present

## 2015-04-07 DIAGNOSIS — R7302 Impaired glucose tolerance (oral): Secondary | ICD-10-CM | POA: Diagnosis not present

## 2015-04-07 DIAGNOSIS — G43709 Chronic migraine without aura, not intractable, without status migrainosus: Secondary | ICD-10-CM | POA: Diagnosis not present

## 2015-04-07 DIAGNOSIS — Z124 Encounter for screening for malignant neoplasm of cervix: Secondary | ICD-10-CM | POA: Diagnosis not present

## 2015-04-07 DIAGNOSIS — Z1239 Encounter for other screening for malignant neoplasm of breast: Secondary | ICD-10-CM | POA: Diagnosis not present

## 2015-04-07 DIAGNOSIS — Z0000 Encounter for general adult medical examination without abnormal findings: Secondary | ICD-10-CM | POA: Diagnosis not present

## 2015-04-07 DIAGNOSIS — I10 Essential (primary) hypertension: Secondary | ICD-10-CM

## 2015-04-07 DIAGNOSIS — D509 Iron deficiency anemia, unspecified: Secondary | ICD-10-CM

## 2015-04-07 DIAGNOSIS — G43909 Migraine, unspecified, not intractable, without status migrainosus: Secondary | ICD-10-CM | POA: Insufficient documentation

## 2015-04-07 LAB — CBC WITH DIFFERENTIAL/PLATELET
BASOS ABS: 0 10*3/uL (ref 0.0–0.1)
BASOS PCT: 0 % (ref 0–1)
Eosinophils Absolute: 0.1 10*3/uL (ref 0.0–0.7)
Eosinophils Relative: 3 % (ref 0–5)
HCT: 33.7 % — ABNORMAL LOW (ref 36.0–46.0)
HEMOGLOBIN: 11 g/dL — AB (ref 12.0–15.0)
Lymphocytes Relative: 46 % (ref 12–46)
Lymphs Abs: 2 10*3/uL (ref 0.7–4.0)
MCH: 30.5 pg (ref 26.0–34.0)
MCHC: 32.6 g/dL (ref 30.0–36.0)
MCV: 93.4 fL (ref 78.0–100.0)
MONOS PCT: 9 % (ref 3–12)
MPV: 9.9 fL (ref 8.6–12.4)
Monocytes Absolute: 0.4 10*3/uL (ref 0.1–1.0)
NEUTROS ABS: 1.8 10*3/uL (ref 1.7–7.7)
Neutrophils Relative %: 42 % — ABNORMAL LOW (ref 43–77)
PLATELETS: 354 10*3/uL (ref 150–400)
RBC: 3.61 MIL/uL — AB (ref 3.87–5.11)
RDW: 13 % (ref 11.5–15.5)
WBC: 4.3 10*3/uL (ref 4.0–10.5)

## 2015-04-07 LAB — LIPID PANEL
CHOL/HDL RATIO: 2 ratio
CHOLESTEROL: 121 mg/dL (ref 0–200)
HDL: 61 mg/dL (ref >=46)
LDL Cholesterol: 47 mg/dL (ref 0–99)
TRIGLYCERIDES: 67 mg/dL (ref <150)
VLDL: 13 mg/dL (ref 0–40)

## 2015-04-07 LAB — COMPREHENSIVE METABOLIC PANEL
ALT: 25 U/L (ref 0–35)
AST: 26 U/L (ref 0–37)
Albumin: 4.1 g/dL (ref 3.5–5.2)
Alkaline Phosphatase: 86 U/L (ref 39–117)
BUN: 28 mg/dL — ABNORMAL HIGH (ref 6–23)
CHLORIDE: 100 meq/L (ref 96–112)
CO2: 26 meq/L (ref 19–32)
CREATININE: 0.72 mg/dL (ref 0.50–1.10)
Calcium: 9.7 mg/dL (ref 8.4–10.5)
Glucose, Bld: 78 mg/dL (ref 70–99)
POTASSIUM: 4 meq/L (ref 3.5–5.3)
Sodium: 138 mEq/L (ref 135–145)
TOTAL PROTEIN: 7.9 g/dL (ref 6.0–8.3)
Total Bilirubin: 0.3 mg/dL (ref 0.2–1.2)

## 2015-04-07 LAB — VITAMIN B12: Vitamin B-12: 1045 pg/mL — ABNORMAL HIGH (ref 211–911)

## 2015-04-07 LAB — HEMOGLOBIN A1C
HEMOGLOBIN A1C: 5.6 % (ref <5.7)
Mean Plasma Glucose: 114 mg/dL (ref <117)

## 2015-04-07 LAB — TSH: TSH: 0.489 u[IU]/mL (ref 0.350–4.500)

## 2015-04-07 LAB — IRON: IRON: 75 ug/dL (ref 42–145)

## 2015-04-07 MED ORDER — AMLODIPINE BESYLATE 10 MG PO TABS: 10.0000 mg | ORAL_TABLET | Freq: Every day | ORAL | Status: DC

## 2015-04-07 MED ORDER — PHENTERMINE HCL 37.5 MG PO CAPS: 37.5000 mg | ORAL_CAPSULE | ORAL | Status: DC

## 2015-04-07 MED ORDER — LISINOPRIL-HYDROCHLOROTHIAZIDE 20-25 MG PO TABS: 1.0000 | ORAL_TABLET | Freq: Every day | ORAL | Status: DC

## 2015-04-07 MED ORDER — SUMATRIPTAN SUCCINATE 100 MG PO TABS: ORAL_TABLET | ORAL | Status: DC

## 2015-04-07 MED ORDER — PROMETHAZINE HCL 25 MG PO TABS: 25.0000 mg | ORAL_TABLET | Freq: Three times a day (TID) | ORAL | Status: DC | PRN

## 2015-04-07 NOTE — Assessment & Plan Note (Signed)
>>  ASSESSMENT AND PLAN FOR CLASS 2 OBESITY WRITTEN ON 04/07/2015  2:54 PM BY , KAWANTA F  Continue phentermine, medication refilled

## 2015-04-07 NOTE — Patient Instructions (Addendum)
We will call with lab results Lisinoprol HCTZ now combination pill Keep up the good work with your weight loss Schedule your Mammogram Try the imitrex for your migraines F/U 4 months

## 2015-04-07 NOTE — Assessment & Plan Note (Signed)
Trial of imitrex as needed, if frequency increases consider prophylactic medication

## 2015-04-07 NOTE — Assessment & Plan Note (Signed)
I will go ahead and recheck an A1c she does have history of diabetes mellitus but with 200lb  weight loss that resolved She no longer checks her blood sugars and is no longer on any medication

## 2015-04-07 NOTE — Assessment & Plan Note (Signed)
Continue phentermine, medication refilled

## 2015-04-07 NOTE — Assessment & Plan Note (Signed)
Check CBC, iron, B12 with gastric bypass history

## 2015-04-07 NOTE — Progress Notes (Signed)
Patient ID: Amy Blair, female   DOB: 1966/11/17, 49 y.o.   MRN: [id number]   Subjective:    Patient ID: Amy Blair, female    DOB: 17-Jul-1966, 49 y.o.   MRN: [id number]  Patient presents for New Patient CPE  patient to establish care. She had a previous primary provider a [facility] in [place] she was being followed for hypertension in her obesity. She was also seen at health services Dr. Delrae Alfred before that and I do have these records. She has history of morbid obesity she was up to 400 ohms she had gastric bypass and was then followed by dietitian and weight loss coach, she has over 200 pounds. Her goal is to get to 175. She was recently started on phentermine by her previous PCP about 3 weeks ago as she had gained about 20 pounds. She is actually had panniculectomy secondary to the excessive skin and plans to have skin were made from her legs and her arms and back eventually. She has history of diabetes mellitus hyperlipidemia which all resolved with her weight loss. She does continue to have polyps of high blood pressure and is on medication for this. She's history of osteoarthritis but has not had any problems since she lost weight and since she is very active.  She also has history of a heart murmur this was evaluated by cardiology in the past and was told this was benign.  She's had headaches for the past few months she was endocarditis back in October 2015 she was actually sent to the emergency room because of concussion and posttraumatic headache she had a spinal tap done which was negative imaging of her head which is been negative she does continue to get headaches on and off since then. She takes ibuprofen which helps minimally. She is also being seen by her ophthalmologist.  PAP Overdue Colonoscopy- had early scope due to anemia with Dr. Ileene Musa UTD  Review Of Systems:  GEN- denies fatigue, fever, weight loss,weakness, recent illness HEENT- denies eye drainage,  change in vision, nasal discharge, CVS- denies chest pain, palpitations RESP- denies SOB, cough, wheeze ABD- denies N/V, change in stools, abd pain GU- denies dysuria, hematuria, dribbling, incontinence MSK- denies joint pain, muscle aches, injury Neuro- + headache, dizziness, syncope, seizure activity       Objective:    BP 128/68 mmHg  Pulse 78  Temp(Src) 98.4 F (36.9 C) (Oral)  Resp 12  Ht  (1.702 m)  Wt 221 lb (100.245 kg)  BMI 34.61 kg/m2 GEN- NAD, alert and oriented x3 HEENT- PERRL, EOMI, non injected sclera, pink conjunctiva, MMM, oropharynx clear Neck- Supple, no thyromegaly Breast- normal symmetry, no nipple inversion,no nipple drainage, no nodules or lumps felt Nodes- no axillary nodes CVS- RRR, soft 2/6 SEM murmur RESP-CTAB ABD-NABS,soft,NT,ND, incision noted GU- normal external genitalia, vaginal mucosa pink and moist, cervix visualized no growth, no blood form os, minimal thin clear discharge, no CMT, no ovarian masses, uterus normal size EXT- No edema Pulses- Radial, DP- 2+        Assessment & Plan:      Problem List Items Addressed This Visit    Obesity    Continue phentermine, medication refilled      Relevant Medications   phentermine 37.5 MG capsule   Other Relevant Orders   CBC with Differential/Platelet   Comprehensive metabolic panel   TSH   Lipid panel   Migraine headache    Trial of imitrex as needed,  if frequency increases consider prophylactic medication      Relevant Medications   SUMAtriptan (IMITREX) 100 MG tablet   amLODipine (NORVASC) 10 MG tablet   lisinopril-hydrochlorothiazide (PRINZIDE,ZESTORETIC) 20-25 MG per tablet   Iron deficiency anemia    Check CBC, iron, B12 with gastric bypass history      Relevant Medications   ferrous sulfate 325 (65 FE) MG tablet   vitamin B-12 (CYANOCOBALAMIN) 100 MCG tablet   Other Relevant Orders   Iron   Vitamin B12   Glucose intolerance (impaired glucose tolerance)    I will  go ahead and recheck an A1c she does have history of diabetes mellitus but with 200lb  weight loss that resolved She no longer checks her blood sugars and is no longer on any medication      Relevant Orders   Lipid panel   Hemoglobin A1c   Essential hypertension    Pressure is well controlled I will change her to lisinopril HCTZ to decrease the pill count, continue norvasc      Relevant Medications   amLODipine (NORVASC) 10 MG tablet   lisinopril-hydrochlorothiazide (PRINZIDE,ZESTORETIC) 20-25 MG per tablet   Other Relevant Orders   CBC with Differential/Platelet   Comprehensive metabolic panel   TSH   Lipid panel    Other Visit Diagnoses    Routine general medical examination at a health care facility    -  Primary    CPE done, PAP Smear, mammo to be scheduled, I will get colonoscopy info    Vitamin D deficiency        Relevant Orders    Vitamin D, 25-hydroxy    Breast cancer screening        Relevant Orders    MM DIGITAL SCREENING BILATERAL    Cervical cancer screening        Relevant Orders    PAP, ThinPrep ASCUS Rflx HPV Rflx Type       Note: This dictation was prepared with Dragon dictation along with smaller phrase technology. Any transcriptional errors that result from this process are unintentional.

## 2015-04-07 NOTE — Assessment & Plan Note (Signed)
Pressure is well controlled I will change her to lisinopril HCTZ to decrease the pill count, continue norvasc

## 2015-04-08 LAB — PAP THINPREP ASCUS RFLX HPV RFLX TYPE

## 2015-04-08 LAB — VITAMIN D 25 HYDROXY (VIT D DEFICIENCY, FRACTURES): Vit D, 25-Hydroxy: 22 ng/mL — ABNORMAL LOW (ref 30–100)

## 2015-04-12 ENCOUNTER — Other Ambulatory Visit: Payer: Self-pay | Admitting: *Deleted

## 2015-04-12 MED ORDER — VITAMIN D (ERGOCALCIFEROL) 1.25 MG (50000 UNIT) PO CAPS: ORAL_CAPSULE | ORAL | Status: DC

## 2015-04-19 ENCOUNTER — Encounter: Payer: Self-pay | Admitting: *Deleted

## 2015-05-28 ENCOUNTER — Encounter: Payer: Self-pay | Admitting: Family Medicine

## 2015-06-07 ENCOUNTER — Encounter: Payer: Self-pay | Admitting: Family Medicine

## 2015-06-07 ENCOUNTER — Ambulatory Visit (INDEPENDENT_AMBULATORY_CARE_PROVIDER_SITE_OTHER): Payer: 59 | Admitting: Family Medicine

## 2015-06-07 VITALS — BP 126/70 | HR 72 | Temp 97.7°F | Resp 14 | Ht 67.0 in | Wt 226.0 lb

## 2015-06-07 DIAGNOSIS — J309 Allergic rhinitis, unspecified: Secondary | ICD-10-CM

## 2015-06-07 DIAGNOSIS — R001 Bradycardia, unspecified: Secondary | ICD-10-CM | POA: Insufficient documentation

## 2015-06-07 DIAGNOSIS — E669 Obesity, unspecified: Secondary | ICD-10-CM | POA: Diagnosis not present

## 2015-06-07 DIAGNOSIS — I10 Essential (primary) hypertension: Secondary | ICD-10-CM

## 2015-06-07 HISTORY — DX: Bradycardia, unspecified: R00.1

## 2015-06-07 LAB — CBC WITH DIFFERENTIAL/PLATELET
BASOS ABS: 0 10*3/uL (ref 0.0–0.1)
Basophils Relative: 0 % (ref 0–1)
EOS PCT: 1 % (ref 0–5)
Eosinophils Absolute: 0 10*3/uL (ref 0.0–0.7)
HCT: 31.9 % — ABNORMAL LOW (ref 36.0–46.0)
HEMOGLOBIN: 10.2 g/dL — AB (ref 12.0–15.0)
Lymphocytes Relative: 44 % (ref 12–46)
Lymphs Abs: 2 10*3/uL (ref 0.7–4.0)
MCH: 29.7 pg (ref 26.0–34.0)
MCHC: 32 g/dL (ref 30.0–36.0)
MCV: 93 fL (ref 78.0–100.0)
MONO ABS: 0.5 10*3/uL (ref 0.1–1.0)
MPV: 8.9 fL (ref 8.6–12.4)
Monocytes Relative: 11 % (ref 3–12)
NEUTROS PCT: 44 % (ref 43–77)
Neutro Abs: 2 10*3/uL (ref 1.7–7.7)
Platelets: 372 10*3/uL (ref 150–400)
RBC: 3.43 MIL/uL — ABNORMAL LOW (ref 3.87–5.11)
RDW: 13.7 % (ref 11.5–15.5)
WBC: 4.6 10*3/uL (ref 4.0–10.5)

## 2015-06-07 LAB — COMPREHENSIVE METABOLIC PANEL
ALT: 21 U/L (ref 0–35)
AST: 23 U/L (ref 0–37)
Albumin: 3.9 g/dL (ref 3.5–5.2)
Alkaline Phosphatase: 89 U/L (ref 39–117)
BUN: 19 mg/dL (ref 6–23)
CALCIUM: 9.1 mg/dL (ref 8.4–10.5)
CO2: 27 meq/L (ref 19–32)
CREATININE: 0.68 mg/dL (ref 0.50–1.10)
Chloride: 103 mEq/L (ref 96–112)
GLUCOSE: 75 mg/dL (ref 70–99)
Potassium: 4.1 mEq/L (ref 3.5–5.3)
Sodium: 140 mEq/L (ref 135–145)
Total Bilirubin: 0.3 mg/dL (ref 0.2–1.2)
Total Protein: 7.5 g/dL (ref 6.0–8.3)

## 2015-06-07 MED ORDER — PHENTERMINE HCL 37.5 MG PO CAPS: 37.5000 mg | ORAL_CAPSULE | ORAL | Status: DC

## 2015-06-07 MED ORDER — CETIRIZINE HCL 10 MG PO TABS: 10.0000 mg | ORAL_TABLET | Freq: Every day | ORAL | Status: DC

## 2015-06-07 NOTE — Assessment & Plan Note (Signed)
Trial of Zyrtec as needed

## 2015-06-07 NOTE — Assessment & Plan Note (Signed)
Continue phentermine as prescribed, she has high protein diet lower car. She works out daily She is medically cleared to have her skin removal surgery which this is not her first one. Her blood pressures within normal limits EKG is reassuring.

## 2015-06-07 NOTE — Assessment & Plan Note (Signed)
>>  ASSESSMENT AND PLAN FOR CLASS 2 OBESITY WRITTEN ON 06/07/2015  8:32 AM BY Ronda, KAWANTA F  Continue phentermine as prescribed, she has high protein diet lower car. She works out daily She is medically cleared to have her skin removal surgery which this is not her first one. Her blood pressures within normal limits EKG is reassuring.

## 2015-06-07 NOTE — Assessment & Plan Note (Signed)
Blood pressure well controlled. I think her dizzy spells were likely due to fluctuations in her glucose and not sleeping very well and just overall fatigue. I'm not change her blood pressure medication today.

## 2015-06-07 NOTE — Assessment & Plan Note (Signed)
HR dropped when she laid back for EKG, down to 47, she states she has had this before and was seen by cardiology a few years ago and told things were norma I am going to review these cardiology records She is not on any rate lowering medications

## 2015-06-07 NOTE — Patient Instructions (Addendum)
Continue phentermine Eat on regular basis every 2-3 hours  Continue current medications Zyrtec for allergies Relesae of records- Cardiology  Change f/u to 4 months

## 2015-06-07 NOTE — Progress Notes (Signed)
Patient ID: Amy Blair, female   DOB: June 09, 1966, 49 y.o.   MRN: [id number]   Subjective:    Patient ID: Amy Blair, female    DOB: [birth year], 49 y.o.   MRN: [id number]  Patient presents for Medication Review/ Refill; Surgical Clearance; and Dizziness   patient here to follow-up medications. She is scheduled to have excessive skin removed off her back by her surgeon Dr. Shon Hough in August. She was advised to come in to have her blood pressure checked for surgical clearance. She denies any chest pain or shortness of breath. She did have some dizzy spells and headache of the past couple weeks but things have been very stressful on her job. 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 Now that everything has settled her headaches and spells have stopped. She has had problems with her seasonal allergies one to try an anti-histamine.  With regards to her obesity she is currently on phentermine but is actually gained 5 pounds since her last visit. Her goal is to get down to 210 before her surgery in August. She will like to continue with the medication and she is now getting her diet/nutrition back on track.    Review Of Systems:  GEN- denies fatigue, fever, weight loss,weakness, recent illness HEENT- denies eye drainage, change in vision, nasal discharge, CVS- denies chest pain, palpitations RESP- denies SOB, cough, wheeze ABD- denies N/V, change in stools, abd pain GU- denies dysuria, hematuria, dribbling, incontinence MSK- denies joint pain, muscle aches, injury Neuro- denies headache,+ dizziness, syncope, seizure activity       Objective:    BP 126/70 mmHg  Pulse 72  Temp(Src) 97.7 F (36.5 C) (Oral)  Resp 14  Ht 5\' 7"  (1.702 m)  Wt 226 lb (102.513 kg)  BMI 35.39 kg/m2 GEN- NAD, alert and oriented x3 HEENT- PERRL, EOMI, non injected sclera, pink  conjunctiva, MMM, oropharynx clear Neck- Supple, no thyromegaly CVS- RRR, soft 2/6 SEM murmur RESP-CTAB ABD-NABS,soft,NT,ND, incision noted Neuro-CNII-XII intact no deficits  EXT- No edema Pulses- Radial, DP- 2+  EKG- Bradycardia HR 47, otherwise sinus sinus rythem       Assessment & Plan:      Problem List Items Addressed This Visit    Obesity   Essential hypertension - Primary   Allergic rhinitis      Note: This dictation was prepared with Dragon dictation along with smaller phrase technology. Any transcriptional errors that result from this process are unintentional.

## 2015-07-12 ENCOUNTER — Encounter: Payer: Self-pay | Admitting: Family Medicine

## 2015-07-12 ENCOUNTER — Ambulatory Visit (INDEPENDENT_AMBULATORY_CARE_PROVIDER_SITE_OTHER): Payer: 59 | Admitting: Family Medicine

## 2015-07-12 VITALS — BP 130/70 | HR 78 | Temp 98.4°F | Resp 16 | Ht 67.0 in | Wt 220.0 lb

## 2015-07-12 DIAGNOSIS — R42 Dizziness and giddiness: Secondary | ICD-10-CM

## 2015-07-12 DIAGNOSIS — E162 Hypoglycemia, unspecified: Secondary | ICD-10-CM | POA: Diagnosis not present

## 2015-07-12 NOTE — Progress Notes (Signed)
Patient ID: Amy Blair, female   DOB: 1966-07-12, 49 y.o.   MRN: [id number]   Subjective:    Patient ID: Amy Blair, female    DOB: 07/12/1966, 49 y.o.   MRN: [id number]  Patient presents for Vertigo  patient here with dizzy spells. She was actually seen about a month ago at that time she had headache with a few dizzy spells that resolved after eating. She had not been sleeping well or eating very well. She comes in today she's had 2 other episodes they both resolved after she ate something.  I last and I thought that low glucose was low likely cause. She did have orthostatics done today in the office that were normal. After review of her journals   she has been eating about 800 cal trying to lose another 10 pounds before her skin removal surgery in August. She's gone very extreme she rarely eats any carbohydrates has also decreased her protein she is still working out daily. She has not had any syncopal events did describe one of the events as for ago were everything felt like it was spinning until she ate something. She denies any chest pain or shortness of breath. She did bring the information from her previous cardiology workup where she had a heart murmur and where she had mild bradycardia which was found to be normal  Ex-  Protein Shake breakfast, Lunch- ( stir Fry- 1 spoon of rice, onions,peppers, 3 pieces of shrimp, 2 chicken cubes) dinner- chicken leg ONLY, water   Review Of Systems:  GEN- denies fatigue, fever, weight loss,weakness, recent illness HEENT- denies eye drainage, change in vision, nasal discharge, CVS- denies chest pain, palpitations RESP- denies SOB, cough, wheeze ABD- denies N/V, change in stools, abd pain GU- denies dysuria, hematuria, dribbling, incontinence MSK- denies joint pain, muscle aches, injury Neuro- denies headache, +dizziness, syncope, seizure activity       Objective:    BP 130/70 mmHg  Pulse 78  Temp(Src) 98.4 F (36.9 C) (Oral)  Resp  16  Ht  (1.702 m)  Wt 220 lb (99.791 kg)  BMI 34.45 kg/m2 GEN- NAD, alert and oriented x3 HEENT- PERRL, EOMI, non injected sclera, pink conjunctiva, MMM, oropharynx clear Neck- Supple, no thyromegaly CVS- RRR, no murmur RESP-CTAB EXT- No edema Neuro- CNI-CXII intact Pulses- Radial, DP- 2+   Orthostatic BP 128/80 Lying, 128/78 sit, 130/80 standing     Assessment & Plan:      Problem List Items Addressed This Visit    None    Visit Diagnoses    Dizziness and giddiness    -  Primary    I think this is due to her severely low calorie intake, episodes of hypoglycemia. discussed importance of eating every 2-3  hours with her weight loss, increasing protein   No sign of hypotension, infection, no neurological deficits      Hypoglycemia- Per above, discussed diet    Note: This dictation was prepared with Dragon dictation along with smaller phrase technology. Any transcriptional errors that result from this process are unintentional.

## 2015-07-12 NOTE — Patient Instructions (Addendum)
Release of records- [facility]   Dr. Karsten Fells office- 2011 (visit for bradycardia) F/U as previous

## 2015-07-30 ENCOUNTER — Encounter: Payer: Self-pay | Admitting: Family Medicine

## 2015-07-30 ENCOUNTER — Ambulatory Visit (INDEPENDENT_AMBULATORY_CARE_PROVIDER_SITE_OTHER): Payer: 59 | Admitting: Family Medicine

## 2015-07-30 VITALS — BP 140/78 | HR 78 | Temp 98.4°F | Resp 18 | Wt 224.0 lb

## 2015-07-30 DIAGNOSIS — R42 Dizziness and giddiness: Secondary | ICD-10-CM

## 2015-07-30 DIAGNOSIS — I10 Essential (primary) hypertension: Secondary | ICD-10-CM

## 2015-07-30 DIAGNOSIS — E669 Obesity, unspecified: Secondary | ICD-10-CM | POA: Diagnosis not present

## 2015-07-30 DIAGNOSIS — Z9884 Bariatric surgery status: Secondary | ICD-10-CM | POA: Diagnosis not present

## 2015-07-30 DIAGNOSIS — G43709 Chronic migraine without aura, not intractable, without status migrainosus: Secondary | ICD-10-CM | POA: Diagnosis not present

## 2015-07-30 DIAGNOSIS — E538 Deficiency of other specified B group vitamins: Secondary | ICD-10-CM | POA: Diagnosis not present

## 2015-07-30 LAB — COMPREHENSIVE METABOLIC PANEL
ALBUMIN: 4.1 g/dL (ref 3.6–5.1)
ALT: 19 U/L (ref 6–29)
AST: 21 U/L (ref 10–35)
Alkaline Phosphatase: 95 U/L (ref 33–130)
BUN: 21 mg/dL (ref 7–25)
CO2: 27 mmol/L (ref 20–31)
Calcium: 9.4 mg/dL (ref 8.6–10.4)
Chloride: 101 mmol/L (ref 98–110)
Creat: 0.68 mg/dL (ref 0.50–1.05)
Glucose, Bld: 71 mg/dL (ref 70–99)
POTASSIUM: 4.3 mmol/L (ref 3.5–5.3)
SODIUM: 138 mmol/L (ref 135–146)
TOTAL PROTEIN: 7.8 g/dL (ref 6.1–8.1)
Total Bilirubin: 0.3 mg/dL (ref 0.2–1.2)

## 2015-07-30 LAB — CBC WITH DIFFERENTIAL/PLATELET
Basophils Absolute: 0 10*3/uL (ref 0.0–0.1)
Basophils Relative: 0 % (ref 0–1)
Eosinophils Absolute: 0.1 10*3/uL (ref 0.0–0.7)
Eosinophils Relative: 3 % (ref 0–5)
HCT: 31.5 % — ABNORMAL LOW (ref 36.0–46.0)
Hemoglobin: 10.5 g/dL — ABNORMAL LOW (ref 12.0–15.0)
LYMPHS ABS: 1.8 10*3/uL (ref 0.7–4.0)
Lymphocytes Relative: 41 % (ref 12–46)
MCH: 31.3 pg (ref 26.0–34.0)
MCHC: 33.3 g/dL (ref 30.0–36.0)
MCV: 93.8 fL (ref 78.0–100.0)
MPV: 9.1 fL (ref 8.6–12.4)
Monocytes Absolute: 0.4 10*3/uL (ref 0.1–1.0)
Monocytes Relative: 10 % (ref 3–12)
Neutro Abs: 2 10*3/uL (ref 1.7–7.7)
Neutrophils Relative %: 46 % (ref 43–77)
Platelets: 409 10*3/uL — ABNORMAL HIGH (ref 150–400)
RBC: 3.36 MIL/uL — ABNORMAL LOW (ref 3.87–5.11)
RDW: 13.6 % (ref 11.5–15.5)
WBC: 4.4 10*3/uL (ref 4.0–10.5)

## 2015-07-30 LAB — VITAMIN B12: VITAMIN B 12: 1310 pg/mL — AB (ref 211–911)

## 2015-07-30 NOTE — Assessment & Plan Note (Signed)
Blood pressure typically well controlled, repeat BP improved, no change to meds

## 2015-07-30 NOTE — Patient Instructions (Addendum)
Nutrition referral- [facility] MRI of brain to be scheduled We will call with lab results F/U as previous

## 2015-07-30 NOTE — Assessment & Plan Note (Signed)
imitrex as needed, though these HA feel different from her migraines

## 2015-07-30 NOTE — Assessment & Plan Note (Signed)
recheck B12 levels, previous over treated

## 2015-07-30 NOTE — Progress Notes (Addendum)
Patient ID: Amy Blair, female   DOB: Mar 30, 1966, 49 y.o.   MRN: [id number]   Subjective:    Patient ID: Amy Blair, female    DOB: 1966/10/10, 49 y.o.   MRN: [id number]  Patient presents for Hypertension and Dizziness  Patient here with recurrent episodes of dizziness. She states that she is now eating more calories but still having spells now associated with a headache on the left side of her head. She became concerned because she has some family history of aneurysm and maternal aunts. She denies any change in her vision the spells are still very short lived. She has not taken any over-the-counter medication for them as they relieved prior to her her being able to take something. She has no nausea vomiting associated. Her blood pressure was a little elevated today she states that she was rushing to get in but she has been taking her medications and has not had a problem with her blood pressure at home. She is still scheduled to have her skin removal surgery in 2 weeks. Her nutritionist did reach out to her from [facility] however she needs a referral to return to their care   Review Of Systems:  GEN- denies fatigue, fever, weight loss,weakness, recent illness HEENT- denies eye drainage, change in vision, nasal discharge, CVS- denies chest pain, palpitations RESP- denies SOB, cough, wheeze ABD- denies N/V, change in stools, abd pain GU- denies dysuria, hematuria, dribbling, incontinence MSK- denies joint pain, muscle aches, injury Neuro-+ headache, +dizziness, syncope, seizure activity       Objective:    BP 140/78 mmHg  Pulse 78  Temp(Src) 98.4 F (36.9 C) (Oral)  Resp 18  Wt 224 lb (101.606 kg) GEN- NAD, alert and oriented x3, weight up 4lbs  HEENT- PERRL, EOMI, non injected sclera, pink conjunctiva, MMM, oropharynx clear, fundus benign CVS- RRR, soft 2/6 SEM RESP-CTAB Neuro-CNII-XII in tact , no deficits  EXT- No edema Pulses- Radial  2+        Assessment &  Plan:      Problem List Items Addressed This Visit    Essential hypertension    Blood pressure typically well controlled, repeat BP improved, no change to meds      B12 deficiency    recheck B12 levels, previous over treated       Relevant Orders   Vitamin B12    Other Visit Diagnoses    Dizziness and giddiness    -  Primary    recurrent episodes and now eating more calories as evidenced by weight gain, check labs, with family history persistent symptoms obtain MRI brain    Relevant Orders    CBC with Differential/Platelet    Comprehensive metabolic panel       Note: This dictation was prepared with Dragon dictation along with smaller phrase technology. Any transcriptional errors that result from this process are unintentional.

## 2015-08-09 ENCOUNTER — Ambulatory Visit: Payer: 59 | Admitting: Family Medicine

## 2015-08-12 ENCOUNTER — Other Ambulatory Visit: Payer: Self-pay | Admitting: Family Medicine

## 2015-08-12 NOTE — Telephone Encounter (Signed)
Refill denied.   12 week course completed.  

## 2015-08-13 ENCOUNTER — Ambulatory Visit
Admission: RE | Admit: 2015-08-13 | Discharge: 2015-08-13 | Disposition: A | Discharge status: Home / Self Care | Payer: 59 | Source: Ambulatory Visit | Attending: Family Medicine | Admitting: Family Medicine

## 2015-08-13 DIAGNOSIS — R42 Dizziness and giddiness: Secondary | ICD-10-CM

## 2015-08-13 DIAGNOSIS — G43709 Chronic migraine without aura, not intractable, without status migrainosus: Secondary | ICD-10-CM

## 2015-08-16 ENCOUNTER — Other Ambulatory Visit: Payer: Self-pay | Admitting: Family Medicine

## 2015-08-16 DIAGNOSIS — R42 Dizziness and giddiness: Secondary | ICD-10-CM

## 2015-08-16 DIAGNOSIS — IMO0002 Reserved for concepts with insufficient information to code with codable children: Secondary | ICD-10-CM

## 2015-09-23 ENCOUNTER — Other Ambulatory Visit: Payer: Self-pay | Admitting: Family Medicine

## 2015-09-23 NOTE — Telephone Encounter (Signed)
Request denied.   12 week course completed.   Patient should be starting OTC Vit D.

## 2015-10-01 ENCOUNTER — Ambulatory Visit: Payer: No Typology Code available for payment source | Admitting: Neurology

## 2015-10-08 ENCOUNTER — Ambulatory Visit (INDEPENDENT_AMBULATORY_CARE_PROVIDER_SITE_OTHER): Payer: 59 | Admitting: Family Medicine

## 2015-10-08 ENCOUNTER — Encounter: Payer: Self-pay | Admitting: Family Medicine

## 2015-10-08 VITALS — BP 136/68 | HR 72 | Temp 97.5°F | Resp 16 | Ht 67.0 in | Wt 230.0 lb

## 2015-10-08 DIAGNOSIS — E669 Obesity, unspecified: Secondary | ICD-10-CM | POA: Diagnosis not present

## 2015-10-08 DIAGNOSIS — L299 Pruritus, unspecified: Secondary | ICD-10-CM | POA: Diagnosis not present

## 2015-10-08 DIAGNOSIS — Z23 Encounter for immunization: Secondary | ICD-10-CM

## 2015-10-08 DIAGNOSIS — I10 Essential (primary) hypertension: Secondary | ICD-10-CM | POA: Diagnosis not present

## 2015-10-08 DIAGNOSIS — K5900 Constipation, unspecified: Secondary | ICD-10-CM

## 2015-10-08 MED ORDER — LINACLOTIDE 145 MCG PO CAPS: 145.0000 ug | ORAL_CAPSULE | Freq: Every day | ORAL | Status: DC

## 2015-10-08 MED ORDER — HYDROXYZINE HCL 10 MG PO TABS: 10.0000 mg | ORAL_TABLET | Freq: Three times a day (TID) | ORAL | Status: DC | PRN

## 2015-10-08 NOTE — Patient Instructions (Addendum)
Try the Atarax for the itching Linzess for the constipation Continue current medications Flu shot given Vitamin D 2000iu  Once a day over the counter  F/U 4 months

## 2015-10-08 NOTE — Assessment & Plan Note (Signed)
Well controlled, no change to meds 

## 2015-10-08 NOTE — Progress Notes (Signed)
Patient ID: Amy Blair, female   DOB: March 23, 1966, 49 y.o.   MRN: [id number]   Subjective:    Patient ID: Amy PizzaSherrill K Blair, female    DOB: March 23, 1966, 49 y.o.   MRN: [id number]  Patient presents for 4 month F/U  Patient here for follow-up. She is status post skin removal surgery from her abdomen and back as well as her arms. She still has sutures in place. She was recently on antibiotics. In general states that she is feeling well but she has had problems with constipation and itching since the surgery. She itches all over but has no rash.  Has tried benadryl. She thinks it is due to the anesthesia she's had some itching with anesthesia before. She's been trying herbal teas and laxatives for her bowels however this caused a lot of cramping and with her surgery this causes too much pain.  She had to reschedule her appointment with neurologist, for her headaches and tremors.   Review Of Systems:  GEN- denies fatigue, fever, weight loss,weakness, recent illness HEENT- denies eye drainage, change in vision, nasal discharge, CVS- denies chest pain, palpitations RESP- denies SOB, cough, wheeze ABD- denies N/V,+ change in stools, abd pain GU- denies dysuria, hematuria, dribbling, incontinence MSK- + joint pain, muscle aches, injury Neuro- denies headache, dizziness, syncope, seizure activity       Objective:    BP 136/68 mmHg  Pulse 72  Temp(Src) 97.5 F (36.4 C) (Oral)  Resp 16  Ht 5\' 7"  (1.702 m)  Wt 230 lb (104.327 kg)  BMI 36.01 kg/m2 GEN- NAD, alert and oriented x3 HEENT- PERRL, EOMI, non injected sclera, pink conjunctiva, MMM, oropharynx clear CVS- RRR, no murmur RESP-CTAB ABD-NABS,soft,ND Skin- Sutures in tact bilat arms, mild serosangiouness drainage from left lower back EXT- No edema Pulses- Radial  2+        Assessment & Plan:      Problem List Items Addressed This Visit    Obesity   Essential hypertension - Primary    Well controlled, no change to meds       Other Visit Diagnoses    Need for prophylactic vaccination and inoculation against influenza        Relevant Orders    Flu Vaccine QUAD 36+ mos PF IM (Fluarix & Fluzone Quad PF) (Completed)    Pruritic condition        2/2 medication SE, will give atarax,    Constipation, unspecified constipation type        2/2 recent surgery and opiod use, given Linzess samples from office       Note: This dictation was prepared with Dragon dictation along with smaller phrase technology. Any transcriptional errors that result from this process are unintentional.

## 2015-10-12 ENCOUNTER — Encounter: Payer: Self-pay | Admitting: Neurology

## 2015-10-12 ENCOUNTER — Ambulatory Visit (INDEPENDENT_AMBULATORY_CARE_PROVIDER_SITE_OTHER): Payer: 59 | Admitting: Neurology

## 2015-10-12 VITALS — BP 100/70 | HR 70 | Ht 67.0 in | Wt 230.6 lb

## 2015-10-12 DIAGNOSIS — G4441 Drug-induced headache, not elsewhere classified, intractable: Secondary | ICD-10-CM

## 2015-10-12 DIAGNOSIS — I679 Cerebrovascular disease, unspecified: Secondary | ICD-10-CM

## 2015-10-12 DIAGNOSIS — G44221 Chronic tension-type headache, intractable: Secondary | ICD-10-CM

## 2015-10-12 DIAGNOSIS — I10 Essential (primary) hypertension: Secondary | ICD-10-CM | POA: Diagnosis not present

## 2015-10-12 DIAGNOSIS — G4440 Drug-induced headache, not elsewhere classified, not intractable: Secondary | ICD-10-CM
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
## 2015-10-12 NOTE — Patient Instructions (Signed)
The headaches were triggered by the neck injury and complicated by taking too much pain relievers for the headache.  That is why they improved once you stopped taking pain relievers all of the time. The MRI findings show evidence of cerebrovascular disease, which means the tiny blood vessels in your brain get clogged up.  This is likely related to your history of hypertension.    I would recommend starting aspirin 81mg  daily We will also check a carotid doppler to look for any significant narrowing of your carotid arteries Continue healthy diet and routine exercise.  Keep hydrated.   Follow up as needed, but will contact you with results of carotid doppler.

## 2015-10-13 ENCOUNTER — Other Ambulatory Visit: Payer: Self-pay | Admitting: Neurology

## 2015-10-13 DIAGNOSIS — R42 Dizziness and giddiness: Secondary | ICD-10-CM

## 2015-10-13 DIAGNOSIS — I679 Cerebrovascular disease, unspecified: Secondary | ICD-10-CM

## 2015-10-13 DIAGNOSIS — I10 Essential (primary) hypertension: Secondary | ICD-10-CM

## 2015-10-15 ENCOUNTER — Ambulatory Visit (HOSPITAL_COMMUNITY)
Admission: RE | Admit: 2015-10-15 | Discharge: 2015-10-15 | Disposition: A | Discharge status: Home / Self Care | Payer: 59 | Source: Ambulatory Visit | Attending: Cardiovascular Disease | Admitting: Cardiovascular Disease

## 2015-10-15 DIAGNOSIS — E119 Type 2 diabetes mellitus without complications: Secondary | ICD-10-CM | POA: Insufficient documentation

## 2015-10-15 DIAGNOSIS — R42 Dizziness and giddiness: Secondary | ICD-10-CM

## 2015-10-15 DIAGNOSIS — I679 Cerebrovascular disease, unspecified: Secondary | ICD-10-CM

## 2015-10-15 DIAGNOSIS — I10 Essential (primary) hypertension: Secondary | ICD-10-CM | POA: Diagnosis not present

## 2015-10-18 ENCOUNTER — Telehealth: Payer: Self-pay

## 2015-10-18 NOTE — Telephone Encounter (Signed)
Pt aware.

## 2015-10-18 NOTE — Progress Notes (Signed)
Quick Note:  Carotid doppler looks okay. Follow up as needed. ______

## 2015-10-18 NOTE — Telephone Encounter (Signed)
-----   Message from Drema DallasAdam R Jaffe, DO sent at 10/18/2015  7:07 AM EDT ----- Carotid doppler looks okay.  Follow up as needed.

## 2015-11-17 ENCOUNTER — Other Ambulatory Visit: Payer: Self-pay | Admitting: Family Medicine

## 2015-11-17 MED ORDER — LINACLOTIDE 145 MCG PO CAPS: 145.0000 ug | ORAL_CAPSULE | Freq: Every day | ORAL | Status: DC

## 2015-12-01 ENCOUNTER — Ambulatory Visit (INDEPENDENT_AMBULATORY_CARE_PROVIDER_SITE_OTHER): Payer: 59 | Admitting: Family Medicine

## 2015-12-01 ENCOUNTER — Encounter: Payer: Self-pay | Admitting: Family Medicine

## 2015-12-01 VITALS — BP 128/72 | HR 78 | Temp 98.0°F | Resp 14 | Ht 67.0 in | Wt 233.0 lb

## 2015-12-01 DIAGNOSIS — S20212A Contusion of left front wall of thorax, initial encounter: Secondary | ICD-10-CM | POA: Diagnosis not present

## 2015-12-01 DIAGNOSIS — W19XXXA Unspecified fall, initial encounter: Secondary | ICD-10-CM

## 2015-12-01 MED ORDER — MELOXICAM 7.5 MG PO TABS: 7.5000 mg | ORAL_TABLET | Freq: Every day | ORAL | Status: DC

## 2015-12-01 MED ORDER — HYDROCODONE-ACETAMINOPHEN 5-325 MG PO TABS: 1.0000 | ORAL_TABLET | Freq: Four times a day (QID) | ORAL | Status: DC | PRN

## 2015-12-01 NOTE — Patient Instructions (Signed)
Take anti-inflammatory as prescribed Take pain medicine as prescribed F/U as previous

## 2015-12-01 NOTE — Progress Notes (Signed)
Patient ID: Amy Blair, female   DOB: 07-20-1966, 49 y.o.   MRN: [id number]   Subjective:    Patient ID: Amy Blair, female    DOB: 07-20-1966, 49 y.o.   MRN: [id number]  Patient presents for S/P fall  issue here status post fall. She fell a week ago she tripped on some carpet and landed abruptly on her chest and her right knee hit the ground. She's had soreness across her chest which has been slowly improving. She's been taking ibuprofen but she does not have any other pain medication. She has some bruising on her right knee as well. She is able to walk just has some mild stiffness. She denies any shortness of breath or sharp chest pain.    Review Of Systems:  GEN- denies fatigue, fever, weight loss,weakness, recent illness HEENT- denies eye drainage, change in vision, nasal discharge, CVS- denies chest pain, palpitations RESP- denies SOB, cough, wheeze ABD- denies N/V, change in stools, abd pain GU- denies dysuria, hematuria, dribbling, incontinence MSK-+ joint pain, muscle aches, injury Neuro- denies headache, dizziness, syncope, seizure activity       Objective:    BP 128/72 mmHg  Pulse 78  Temp(Src) 98 F (36.7 C) (Oral)  Resp 14  Ht 5\' 7"  (1.702 m)  Wt 233 lb (105.688 kg)  BMI 36.48 kg/m2 GEN- NAD, alert and oriented x3 HEENT- PERRL, EOMI, non injected sclera, pink conjunctiva, MMM, oropharynx clear CVS- RRR, no murmur RESP-CTAB Chest wall- TTP across chest wall and below left breast no ecchymosis, no step off MSK- Right knee- small bruise lateral aspect, mild localized swelling at area of bruise, no palpable hematoma, good ROM knees bilat  Pulses- Radial 2+        Assessment & Plan:      Problem List Items Addressed This Visit    None    Visit Diagnoses    Fall, initial encounter    -  Primary    Resulting soft stissue injury to knee and chest wall,. Given Mobic and Norco short term, ICE knee    Superficial bruising of chest wall, left, initial  encounter           Note: This dictation was prepared with Dragon dictation along with smaller phrase technology. Any transcriptional errors that result from this process are unintentional.

## 2015-12-29 ENCOUNTER — Encounter: Payer: Self-pay | Admitting: Family Medicine

## 2015-12-29 ENCOUNTER — Ambulatory Visit (INDEPENDENT_AMBULATORY_CARE_PROVIDER_SITE_OTHER): Payer: BLUE CROSS/BLUE SHIELD | Admitting: Family Medicine

## 2015-12-29 ENCOUNTER — Other Ambulatory Visit: Payer: Self-pay | Admitting: Family Medicine

## 2015-12-29 VITALS — BP 130/70 | HR 88 | Temp 97.9°F | Resp 16 | Ht 67.0 in | Wt 224.0 lb

## 2015-12-29 DIAGNOSIS — E669 Obesity, unspecified: Secondary | ICD-10-CM | POA: Diagnosis not present

## 2015-12-29 DIAGNOSIS — N611 Abscess of the breast and nipple: Secondary | ICD-10-CM | POA: Diagnosis not present

## 2015-12-29 MED ORDER — SULFAMETHOXAZOLE-TRIMETHOPRIM 800-160 MG PO TABS: 1.0000 | ORAL_TABLET | Freq: Two times a day (BID) | ORAL | Status: DC

## 2015-12-29 MED ORDER — PHENTERMINE HCL 37.5 MG PO TABS: 37.5000 mg | ORAL_TABLET | Freq: Every day | ORAL | Status: DC

## 2015-12-29 NOTE — Patient Instructions (Addendum)
Take antibiotics as prescribed Antibacterial soap to be used Once complete may start the phentermine back again F/U Friday for recheck on abscess

## 2015-12-29 NOTE — Assessment & Plan Note (Signed)
>>  ASSESSMENT AND PLAN FOR CLASS 2 OBESITY WRITTEN ON 12/29/2015  5:25 PM BY Milinda Antis F  She may restart the phentermine after she has completed her antibiotics and the infection has cleared

## 2015-12-29 NOTE — Progress Notes (Signed)
Patient ID: Amy Blair, female   DOB: 11-06-1966, 50 y.o.   MRN: [id number]   Subjective:    Patient ID: Amy Blair, female    DOB: 11-06-1966, 50 y.o.   MRN: [id number]  Patient presents for Abscess  patient here with an abscess to her right breast. She states on Monday she noticed a pimple-like lesion she did try squeezing it and a small amount of pus came out. The next morning she will cup it was about the size of a nickel and then became larger as the day went on she is tried to pop the abscess and a large amount of pus came draining out. She's not had any fever or any chills but has significant tenderness and pain around the area.   She will also like to restart her phentermine. Continue her weight loss journey. She's not had any other palpitations and we have reviewed the cardiology information with regards to the murmur.  Review Of Systems:  GEN- denies fatigue, fever, weight loss,weakness, recent illness HEENT- denies eye drainage, change in vision, nasal discharge, CVS- denies chest pain, palpitations RESP- denies SOB, cough, wheeze Neuro- denies headache, dizziness, syncope, seizure activity       Objective:    BP 130/70 mmHg  Pulse 88  Temp(Src) 97.9 F (36.6 C) (Oral)  Resp 16  Ht 5\' 7"  (1.702 m)  Wt 224 lb (101.606 kg)  BMI 35.08 kg/m2 GEN- NAD, alert and oriented x3 Breast- normal symmetry, no nipple inversion,no nipple drainage, no nodules or lumps felt Nodes- no axillary nodes Skin- right breast approx 7 oclock postion quarter size abscess with serosangiouness draiange, erythema surrounding TTP  Procedure- Incision and Drainage Procedure explained to patient questions answered benefits and risks discussed verbal consent obtained. Antiseptic-Betadine Anesthesia-lidocaine 1% Incision performed large amount of pus expressed Culture taken Minimal blood loss 3 inches of Iodoform placed  Patient tolerated procedure well Bandage applied          Assessment & Plan:      Problem List Items Addressed This Visit    Obesity    She may restart the phentermine after she has completed her antibiotics and the infection has cleared      Relevant Medications   phentermine (ADIPEX-P) 37.5 MG tablet    Other Visit Diagnoses    Abscess of right breast    -  Primary    Iron size abscess on her right lower breast. Status post incision and drainage of possibly 3-1/2 inches of packing placed. Start Bactrim DS culture sent, wound care discussed recheck in 48 hours    Relevant Orders    Wound culture       Note: This dictation was prepared with Dragon dictation along with smaller phrase technology. Any transcriptional errors that result from this process are unintentional.

## 2015-12-29 NOTE — Telephone Encounter (Signed)
Refill denied.   12 week course completed.  

## 2015-12-29 NOTE — Assessment & Plan Note (Signed)
She may restart the phentermine after she has completed her antibiotics and the infection has cleared

## 2015-12-31 ENCOUNTER — Ambulatory Visit (INDEPENDENT_AMBULATORY_CARE_PROVIDER_SITE_OTHER): Payer: BLUE CROSS/BLUE SHIELD | Admitting: Family Medicine

## 2015-12-31 ENCOUNTER — Encounter: Payer: Self-pay | Admitting: Family Medicine

## 2015-12-31 VITALS — BP 112/76 | HR 76 | Temp 97.2°F | Resp 18 | Wt 223.0 lb

## 2015-12-31 DIAGNOSIS — N611 Abscess of the breast and nipple: Secondary | ICD-10-CM

## 2015-12-31 NOTE — Patient Instructions (Signed)
Remove 1 inch of packing on Sunday,  Shower use anti-bacterial soap No fragrance  Monday Remove remainder of packing  Come in Tuesday for recheck if not better  Complete the 10 days of antibiotics F/U as previous

## 2015-12-31 NOTE — Progress Notes (Signed)
Patient ID: Amy Blair, female   DOB: [birth year], 50 y.o.   MRN: [id number]   Subjective:    Patient ID: Amy PizzaSherrill K Blair, female    DOB: [birth year], 50 y.o.   MRN: [id number]  Patient presents for wound recheck   patient here for wound check she was seen 2 days ago for abscess of right breast. Culture was taken during incision and drainage showing moderate amount of staph aureus sensitivities are pending. She's not had a lot of drainage but still has tenderness around the spot. She is taking the hydrocodone as needed as well as the Bactrim antibiotics. No new concerns today.   Review Of Systems:  GEN- denies fatigue, fever, weight loss,weakness, recent illness HEENT- denies eye drainage, change in vision, nasal discharge, CVS- denies chest pain, palpitations RESP- denies SOB, cough, wheeze Neuro- denies headache, dizziness, syncope, seizure activity       Objective:    BP 112/76 mmHg  Pulse 76  Temp(Src) 97.2 F (36.2 C) (Oral)  Resp 18  Wt 223 lb (101.152 kg) GEN- NAD, alert and oriented x3 Skin- I and D site- iodoform in center- 1 inch removed, mild induration around site, Decreased erythema and tenderness. No new fluctuant areas         Assessment & Plan:      Problem List Items Addressed This Visit    None    Visit Diagnoses    Abscess of right breast    -  Primary    Complete antibiotics, Staph Aureus sensitivity pending. Removed 1 inch of packing, instructions given to remove packing on Sunday and Monday. Recheck Tuesday if not improved, she has done significant wound care in the past feels comfortable at home       Note: This dictation was prepared with Dragon dictation along with smaller phrase technology. Any transcriptional errors that result from this process are unintentional.

## 2016-01-01 LAB — WOUND CULTURE: Gram Stain: NONE SEEN

## 2016-01-09 ENCOUNTER — Emergency Department (HOSPITAL_COMMUNITY)
Admission: EM | Admit: 2016-01-09 | Discharge: 2016-01-09 | Disposition: A | Discharge status: Home / Self Care | Payer: Self-pay | Attending: Emergency Medicine | Admitting: Emergency Medicine

## 2016-01-09 ENCOUNTER — Encounter (HOSPITAL_COMMUNITY): Payer: Self-pay | Admitting: *Deleted

## 2016-01-09 DIAGNOSIS — Y9289 Other specified places as the place of occurrence of the external cause: Secondary | ICD-10-CM | POA: Insufficient documentation

## 2016-01-09 DIAGNOSIS — Z792 Long term (current) use of antibiotics: Secondary | ICD-10-CM | POA: Insufficient documentation

## 2016-01-09 DIAGNOSIS — M1990 Unspecified osteoarthritis, unspecified site: Secondary | ICD-10-CM | POA: Insufficient documentation

## 2016-01-09 DIAGNOSIS — Y998 Other external cause status: Secondary | ICD-10-CM | POA: Insufficient documentation

## 2016-01-09 DIAGNOSIS — X58XXXA Exposure to other specified factors, initial encounter: Secondary | ICD-10-CM | POA: Insufficient documentation

## 2016-01-09 DIAGNOSIS — Y9389 Activity, other specified: Secondary | ICD-10-CM | POA: Insufficient documentation

## 2016-01-09 DIAGNOSIS — T7840XA Allergy, unspecified, initial encounter: Secondary | ICD-10-CM | POA: Insufficient documentation

## 2016-01-09 DIAGNOSIS — D649 Anemia, unspecified: Secondary | ICD-10-CM | POA: Insufficient documentation

## 2016-01-09 DIAGNOSIS — R011 Cardiac murmur, unspecified: Secondary | ICD-10-CM | POA: Insufficient documentation

## 2016-01-09 DIAGNOSIS — Z791 Long term (current) use of non-steroidal anti-inflammatories (NSAID): Secondary | ICD-10-CM | POA: Insufficient documentation

## 2016-01-09 DIAGNOSIS — E119 Type 2 diabetes mellitus without complications: Secondary | ICD-10-CM | POA: Insufficient documentation

## 2016-01-09 DIAGNOSIS — Z79899 Other long term (current) drug therapy: Secondary | ICD-10-CM | POA: Insufficient documentation

## 2016-01-09 DIAGNOSIS — I10 Essential (primary) hypertension: Secondary | ICD-10-CM | POA: Insufficient documentation

## 2016-01-09 MED ORDER — LORAZEPAM 1 MG PO TABS: 1.0000 mg | ORAL_TABLET | Freq: Three times a day (TID) | ORAL | Status: DC | PRN

## 2016-01-09 MED ORDER — FAMOTIDINE 20 MG PO TABS
20.0000 mg | ORAL_TABLET | Freq: Once | ORAL | Status: AC
  Administered 2016-01-09: 20 mg via ORAL
  Filled 2016-01-09: qty 1

## 2016-01-09 MED ORDER — PREDNISONE 20 MG PO TABS: 60.0000 mg | ORAL_TABLET | Freq: Every day | ORAL | Status: DC

## 2016-01-09 MED ORDER — DIPHENHYDRAMINE HCL 25 MG PO CAPS
50.0000 mg | ORAL_CAPSULE | Freq: Once | ORAL | Status: AC
  Administered 2016-01-09: 50 mg via ORAL
  Filled 2016-01-09: qty 2

## 2016-01-09 MED ORDER — LORAZEPAM 0.5 MG PO TABS
1.0000 mg | ORAL_TABLET | Freq: Once | ORAL | Status: AC
  Administered 2016-01-09: 1 mg via ORAL
  Filled 2016-01-09: qty 2

## 2016-01-09 MED ORDER — PREDNISONE 20 MG PO TABS
60.0000 mg | ORAL_TABLET | Freq: Once | ORAL | Status: AC
  Administered 2016-01-09: 60 mg via ORAL
  Filled 2016-01-09: qty 3

## 2016-01-09 NOTE — ED Notes (Signed)
Patient reports itching is better but not gone.  Reports is itching a little.  Patient reports she has had Sprite and water to drink.  Reports no problems drinking.

## 2016-01-09 NOTE — ED Notes (Signed)
No acute distress noted.

## 2016-01-09 NOTE — Discharge Instructions (Signed)
Take Claritin or Zyrtec once a day for the next week. Take Benadryl as needed for itching not relieved by Claritin or Zyrtec. You may also take either Pepcid AC or Zantac to supplement the antihistamine effect of Claritin, Zyrtec, Benadryl. Take Ativan (lorazepam) as needed for itching not controlled by all of the other medications.   Allergies An allergy is an abnormal reaction to a substance by the body's defense system (immune system). Allergies can develop at any age. WHAT CAUSES ALLERGIES? An allergic reaction happens when the immune system mistakenly reacts to a normally harmless substance, called an allergen, as if it were harmful. The immune system releases antibodies to fight the substance. Antibodies eventually release a chemical called histamine into the bloodstream. The release of histamine is meant to protect the body from infection, but it also causes discomfort. An allergic reaction can be triggered by:  Eating an allergen.  Inhaling an allergen.  Touching an allergen. WHAT TYPES OF ALLERGIES ARE THERE? There are many types of allergies. Common types include:  Seasonal allergies. People with this type of allergy are usually allergic to substances that are only present during certain seasons, such as molds and pollens.  Food allergies.  Drug allergies.  Insect allergies.  Animal dander allergies. WHAT ARE SYMPTOMS OF ALLERGIES? Possible allergy symptoms include:  Swelling of the lips, face, tongue, mouth, or throat.  Sneezing, coughing, or wheezing.  Nasal congestion.  Tingling in the mouth.  Rash.  Itching.  Itchy, red, swollen areas of skin (hives).  Watery eyes.  Vomiting.  Diarrhea.  Dizziness.  Lightheadedness.  Fainting.  Trouble breathing or swallowing.  Chest tightness.  Rapid heartbeat. HOW ARE ALLERGIES DIAGNOSED? Allergies are diagnosed with a medical and family history and one or more of the following:  Skin tests.  Blood  tests.  A food diary. A food diary is a record of all the foods and drinks you have in a day and of all the symptoms you experience.  The results of an elimination diet. An elimination diet involves eliminating foods from your diet and then adding them back in one by one to find out if a certain food causes an allergic reaction. HOW ARE ALLERGIES TREATED? There is no cure for allergies, but allergic reactions can be treated with medicine. Severe reactions usually need to be treated at a hospital. HOW CAN REACTIONS BE PREVENTED? The best way to prevent an allergic reaction is by avoiding the substance you are allergic to. Allergy shots and medicines can also help prevent reactions in some cases. People with severe allergic reactions may be able to prevent a life-threatening reaction called anaphylaxis with a medicine given right after exposure to the allergen.   This information is not intended to replace advice given to you by your health care provider. Make sure you discuss any questions you have with your health care provider.   Document Released: 02/27/2003 Document Revised: 12/25/2014 Document Reviewed: 09/15/2014 Elsevier Interactive Patient Education 2016 [employer].  Prednisone tablets What is this medicine? PREDNISONE (PRED ni sone) is a corticosteroid. It is commonly used to treat inflammation of the skin, joints, lungs, and other organs. Common conditions treated include asthma, allergies, and arthritis. It is also used for other conditions, such as blood disorders and diseases of the adrenal glands. This medicine may be used for other purposes; ask your health care provider or pharmacist if you have questions. What should I tell my health care provider before I take this medicine? They need to  know if you have any of these conditions: -Cushing's syndrome -diabetes -glaucoma -heart disease -high blood pressure -infection (especially a virus infection such as chickenpox, cold  sores, or herpes) -kidney disease -liver disease -mental illness -myasthenia gravis -osteoporosis -seizures -stomach or intestine problems -thyroid disease -an unusual or allergic reaction to lactose, prednisone, other medicines, foods, dyes, or preservatives -pregnant or trying to get pregnant -breast-feeding How should I use this medicine? Take this medicine by mouth with a glass of water. Follow the directions on the prescription label. Take this medicine with food. If you are taking this medicine once a day, take it in the morning. Do not take more medicine than you are told to take. Do not suddenly stop taking your medicine because you may develop a severe reaction. Your doctor will tell you how much medicine to take. If your doctor wants you to stop the medicine, the dose may be slowly lowered over time to avoid any side effects. Talk to your pediatrician regarding the use of this medicine in children. Special care may be needed. Overdosage: If you think you have taken too much of this medicine contact a poison control center or emergency room at once. NOTE: This medicine is only for you. Do not share this medicine with others. What if I miss a dose? If you miss a dose, take it as soon as you can. If it is almost time for your next dose, talk to your doctor or health care professional. You may need to miss a dose or take an extra dose. Do not take double or extra doses without advice. What may interact with this medicine? Do not take this medicine with any of the following medications: -metyrapone -mifepristone This medicine may also interact with the following medications: -aminoglutethimide -amphotericin B -aspirin and aspirin-like medicines -barbiturates -certain medicines for diabetes, like glipizide or glyburide -cholestyramine -cholinesterase inhibitors -cyclosporine -digoxin -diuretics -ephedrine -female hormones, like estrogens and birth control  pills -isoniazid -ketoconazole -NSAIDS, medicines for pain and inflammation, like ibuprofen or naproxen -phenytoin -rifampin -toxoids -vaccines -warfarin This list may not describe all possible interactions. Give your health care provider a list of all the medicines, herbs, non-prescription drugs, or dietary supplements you use. Also tell them if you smoke, drink alcohol, or use illegal drugs. Some items may interact with your medicine. What should I watch for while using this medicine? Visit your doctor or health care professional for regular checks on your progress. If you are taking this medicine over a prolonged period, carry an identification card with your name and address, the type and dose of your medicine, and your doctor's name and address. This medicine may increase your risk of getting an infection. Tell your doctor or health care professional if you are around anyone with measles or chickenpox, or if you develop sores or blisters that do not heal properly. If you are going to have surgery, tell your doctor or health care professional that you have taken this medicine within the last twelve months. Ask your doctor or health care professional about your diet. You may need to lower the amount of salt you eat. This medicine may affect blood sugar levels. If you have diabetes, check with your doctor or health care professional before you change your diet or the dose of your diabetic medicine. What side effects may I notice from receiving this medicine? Side effects that you should report to your doctor or health care professional as soon as possible: -allergic reactions like skin rash, itching  or hives, swelling of the face, lips, or tongue -changes in emotions or moods -changes in vision -depressed mood -eye pain -fever or chills, cough, sore throat, pain or difficulty passing urine -increased thirst -swelling of ankles, feet Side effects that usually do not require medical  attention (report to your doctor or health care professional if they continue or are bothersome): -confusion, excitement, restlessness -headache -nausea, vomiting -skin problems, acne, thin and shiny skin -trouble sleeping -weight gain This list may not describe all possible side effects. Call your doctor for medical advice about side effects. You may report side effects to FDA at 1-800-FDA-1088. Where should I keep my medicine? Keep out of the reach of children. Store at room temperature between 15 and 30 degrees C (59 and 86 degrees F). Protect from light. Keep container tightly closed. Throw away any unused medicine after the expiration date. NOTE: This sheet is a summary. It may not cover all possible information. If you have questions about this medicine, talk to your doctor, pharmacist, or health care provider.    2016, Elsevier/Gold Standard. (2011-07-20 10:57:14)   Lorazepam tablets What is this medicine? LORAZEPAM (lor A ze pam) is a benzodiazepine. It is used to treat anxiety. This medicine may be used for other purposes; ask your health care provider or pharmacist if you have questions. What should I tell my health care provider before I take this medicine? They need to know if you have any of these conditions: -alcohol or drug abuse problem -bipolar disorder, depression, psychosis or other mental health condition -glaucoma -kidney or liver disease -lung disease or breathing difficulties -myasthenia gravis -Parkinson's disease -seizures or a history of seizures -suicidal thoughts -an unusual or allergic reaction to lorazepam, other benzodiazepines, foods, dyes, or preservatives -pregnant or trying to get pregnant -breast-feeding How should I use this medicine? Take this medicine by mouth with a glass of water. Follow the directions on the prescription label. If it upsets your stomach, take it with food or milk. Take your medicine at regular intervals. Do not take it  more often than directed. Do not stop taking except on the advice of your doctor or health care professional. Talk to your pediatrician regarding the use of this medicine in children. Special care may be needed. Overdosage: If you think you have taken too much of this medicine contact a poison control center or emergency room at once. NOTE: This medicine is only for you. Do not share this medicine with others. What if I miss a dose? If you miss a dose, take it as soon as you can. If it is almost time for your next dose, take only that dose. Do not take double or extra doses. What may interact with this medicine? -barbiturate medicines for inducing sleep or treating seizures, like phenobarbital -clozapine -medicines for depression, mental problems or psychiatric disturbances -medicines for sleep -phenytoin -probenecid -theophylline -valproic acid This list may not describe all possible interactions. Give your health care provider a list of all the medicines, herbs, non-prescription drugs, or dietary supplements you use. Also tell them if you smoke, drink alcohol, or use illegal drugs. Some items may interact with your medicine. What should I watch for while using this medicine? Visit your doctor or health care professional for regular checks on your progress. Your body may become dependent on this medicine, ask your doctor or health care professional if you still need to take it. However, if you have been taking this medicine regularly for some time, do not  suddenly stop taking it. You must gradually reduce the dose or you may get severe side effects. Ask your doctor or health care professional for advice before increasing or decreasing the dose. Even after you stop taking this medicine it can still affect your body for several days. You may get drowsy or dizzy. Do not drive, use machinery, or do anything that needs mental alertness until you know how this medicine affects you. To reduce the risk of  dizzy and fainting spells, do not stand or sit up quickly, especially if you are an older patient. Alcohol may increase dizziness and drowsiness. Avoid alcoholic drinks. Do not treat yourself for coughs, colds or allergies without asking your doctor or health care professional for advice. Some ingredients can increase possible side effects. What side effects may I notice from receiving this medicine? Side effects that you should report to your doctor or health care professional as soon as possible: -changes in vision -confusion -depression -mood changes, excitability or aggressive behavior -movement difficulty, staggering or jerky movements -muscle cramps -restlessness -weakness or tiredness Side effects that usually do not require medical attention (report to your doctor or health care professional if they continue or are bothersome): -constipation or diarrhea -difficulty sleeping, nightmares -dizziness, drowsiness -headache -nausea, vomiting This list may not describe all possible side effects. Call your doctor for medical advice about side effects. You may report side effects to FDA at 1-800-FDA-1088. Where should I keep my medicine? Keep out of the reach of children. This medicine can be abused. Keep your medicine in a safe place to protect it from theft. Do not share this medicine with anyone. Selling or giving away this medicine is dangerous and against the law. This medicine may cause accidental overdose and death if taken by other adults, children, or pets. Mix any unused medicine with a substance like cat litter or coffee grounds. Then throw the medicine away in a sealed container like a sealed bag or a coffee can with a lid. Do not use the medicine after the expiration date. Store at room temperature between 20 and 25 degrees C (68 and 77 degrees F). Protect from light. Keep container tightly closed. NOTE: This sheet is a summary. It may not cover all possible information. If you  have questions about this medicine, talk to your doctor, pharmacist, or health care provider.    2016, Elsevier/Gold Standard. (2014-08-25 15:24:21)

## 2016-01-09 NOTE — ED Notes (Signed)
Patient presents stating she is allergic to something.  Red rash noted om her arms and hands   Took Benadryl last night before going to bed

## 2016-01-09 NOTE — ED Provider Notes (Signed)
CSN: [id number]     Arrival date & time 01/09/16  0441 History   First MD Initiated Contact with Patient 01/09/16 [phone]     Chief Complaint  Patient presents with  . Allergic Reaction     (Consider location/radiation/quality/duration/timing/severity/associated sxs/prior Treatment) Patient is a 50 y.o. female presenting with allergic reaction. The history is provided by the patient.  Allergic Reaction She started breaking out in a rash yesterday evening. Rash is present on the arms, legs, back. It is very itchy. She denies difficult breathing or swallowing. She states that she had taken some ibuprofen, and she also just completed a course of trimethoprim-sulfamethoxazole. She is not had rashes like this before. She took diphenhydramine which only gave slight relief.  Past Medical History  Diagnosis Date  . Hypertension   . Anemia     HX  . Arthritis     knees-a little  . Heart murmur   . Diabetes mellitus without complication [facility])    Past Surgical History  Procedure Laterality Date  . Torn quad reapair  07/2008    Right  . Gastric bypass  06/2009  . Panniculectomy  hernia repair  04/05/2012  . Cesarean section    . Panniculectomy  04/05/2012    Procedure: PANNICULECTOMY;  Surgeon: Louisa Second, MD;  Location: [facility] OR;  Service: Plastics;  Laterality: N/A;  . Liposuction  04/05/2012    Procedure: LIPOSUCTION;  Surgeon: Louisa Second, MD;  Location: [facility] OR;  Service: Plastics;  Laterality: N/A;  . Umbilical hernia repair  04/05/2012    Procedure: HERNIA REPAIR UMBILICAL ADULT;  Surgeon: Louisa Second, MD;  Location: [facility] OR;  Service: Plastics;  Laterality: N/A;  . Cosmetic surgery     Family History  Problem Relation Age of Onset  . Anesthesia problems Neg Hx   . Arthritis Mother   . Cancer Mother   . Diabetes Mother   . Hyperlipidemia Mother   . Hypertension Mother   . Diabetes Father   . Heart disease Father    Social History  Substance Use Topics  . Smoking status:  Never Smoker   . Smokeless tobacco: Never Used  . Alcohol Use: 0.0 oz/week    0 Standard drinks or equivalent per week     Comment: OCCASIONALLY    OB History    No data available     Review of Systems  All other systems reviewed and are negative.     Allergies  Review of patient's allergies indicates no known allergies.  Home Medications   Prior to Admission medications   Medication Sig Start Date End Date Taking? Authorizing Provider  amLODipine (NORVASC) 10 MG tablet Take 1 tablet (10 mg total) by mouth daily. 04/07/15   Salley Scarlet, MD  cetirizine (ZYRTEC) 10 MG tablet Take 1 tablet (10 mg total) by mouth daily. 06/07/15   Salley Scarlet, MD  Cholecalciferol (VITAMIN D) 2000 UNITS CAPS Take by mouth.    Historical Provider, MD  ferrous sulfate 325 (65 FE) MG tablet Take 325 mg by mouth daily with breakfast.    Historical Provider, MD  HYDROcodone-acetaminophen (NORCO) 5-325 MG tablet Take 1 tablet by mouth every 6 (six) hours as needed for moderate pain. 12/01/15   Salley Scarlet, MD  hydrOXYzine (ATARAX/VISTARIL) 10 MG tablet Take 1 tablet (10 mg total) by mouth 3 (three) times daily as needed. 10/08/15   Salley Scarlet, MD  Linaclotide [facility]) 145 MCG CAPS capsule Take 1 capsule (145 mcg total) by  mouth daily. 11/17/15   Salley Scarlet, MD  lisinopril-hydrochlorothiazide (PRINZIDE,ZESTORETIC) 20-25 MG per tablet Take 1 tablet by mouth daily. 04/07/15   Salley Scarlet, MD  meloxicam (MOBIC) 7.5 MG tablet Take 1 tablet (7.5 mg total) by mouth daily. 12/01/15   Salley Scarlet, MD  Multiple Vitamin (MULTIVITAMIN WITH MINERALS) TABS tablet Take 1 tablet by mouth daily.    Historical Provider, MD  phentermine (ADIPEX-P) 37.5 MG tablet Take 1 tablet (37.5 mg total) by mouth daily before breakfast. 12/29/15   Salley Scarlet, MD  promethazine (PHENERGAN) 25 MG tablet Take 1 tablet (25 mg total) by mouth every 8 (eight) hours as needed for nausea or vomiting. 04/07/15    Salley Scarlet, MD  sulfamethoxazole-trimethoprim (BACTRIM DS,SEPTRA DS) 800-160 MG tablet Take 1 tablet by mouth 2 (two) times daily. 12/29/15   Salley Scarlet, MD  SUMAtriptan (IMITREX) 100 MG tablet Take 1 tablet at onset, then repeat in 2 hours 04/07/15   Salley Scarlet, MD  vitamin B-12 (CYANOCOBALAMIN) 100 MCG tablet Take 100 mcg by mouth 3 (three) times a week.    Historical Provider, MD   BP 117/73 mmHg  Pulse 83  Temp(Src) 99.7 F (37.6 C) (Oral)  Resp 18  SpO2 98% Physical Exam  Nursing note and vitals reviewed.  50 year old female, resting comfortably and in no acute distress. Vital signs are normal. Oxygen saturation is 98%, which is normal. Head is normocephalic and atraumatic. PERRLA, EOMI. Oropharynx is clear. Neck is nontender and supple without adenopathy or JVD. Back is nontender and there is no CVA tenderness. Lungs are clear without rales, wheezes, or rhonchi. Chest is nontender. Heart has regular rate and rhythm without murmur. Abdomen is soft, flat, nontender without masses or hepatosplenomegaly and peristalsis is normoactive. Extremities have no cyanosis or edema, full range of motion is present. Skin has faint erythematous rash over the arms, legs, back without any urticarial lesions seen. Neurologic: Mental status is normal, cranial nerves are intact, there are no motor or sensory deficits.  ED Course  Procedures (including critical care time)   MDM   Final diagnoses:  Allergic reaction, initial encounter    Allergic rash of uncertain cause. Statistically, sulfur is much more likely than ibuprofen to be the culprit. She is given diphenhydramine, famotidine, and prednisone. Old records are reviewed and she has no relevant past visits.  She had incomplete relief with the above treatment. She is given a dose of lorazepam, and discharged with prescriptions for Prednisone and lorazepam. Told to use OTC Claritin or Zyrtec, OTC diphenhydramine, and OTC  famotidine or ranitidine.  Dione Booze, MD 01/09/16 [phone]

## 2016-01-10 ENCOUNTER — Encounter: Payer: Self-pay | Admitting: Family Medicine

## 2016-01-10 ENCOUNTER — Ambulatory Visit (INDEPENDENT_AMBULATORY_CARE_PROVIDER_SITE_OTHER): Payer: BLUE CROSS/BLUE SHIELD | Admitting: Family Medicine

## 2016-01-10 VITALS — BP 128/78 | HR 80 | Temp 97.9°F | Resp 12 | Ht 67.0 in | Wt 220.0 lb

## 2016-01-10 DIAGNOSIS — T7840XD Allergy, unspecified, subsequent encounter: Secondary | ICD-10-CM

## 2016-01-10 NOTE — Patient Instructions (Signed)
Finish the prednisone   Try benadryl during the day  Take Ativan  F/U as previous

## 2016-01-10 NOTE — Progress Notes (Signed)
Patient ID: Amy Blair, female   DOB: 10-Apr-1966, 50 y.o.   MRN: [id number]      Subjective:    Patient ID: Amy Blair, female    DOB: 15-Nov-1966, 50 y.o.   MRN: [id number]  Patient presents for ER F/U  here for ER follow-up. She was seen in the ER yesterday if she broke out in hives on her face torso in her lower extremities. She just completed a ten-day course of Bactrim secondary to an abscess on her right breast. The abscess is pretty much resolved there is a very small scab but no drainage. She is using tripling biotic on this. She did not change anything with her diet detergent otherwise to suggest a cause for the rash. In the emergency room she was given Pepcid as well as prednisone she was sent home with prednisone 60 mg for the next 3 days as well as low-dose lorazepam and she is taking Zyrtec. Her hives have now disappeared but she still has some itching that is worse at nighttime.    Review Of Systems:  GEN- denies fatigue, fever, weight loss,weakness, recent illness HEENT- denies eye drainage, change in vision, nasal discharge, CVS- denies chest pain, palpitations RESP- denies SOB, cough, wheeze ABD- denies N/V, change in stools, abd pain GU- denies dysuria, hematuria, dribbling, incontinence MSK- denies joint pain, muscle aches, injury Neuro- denies headache, dizziness, syncope, seizure activity       Objective:    BP 128/78 mmHg  Pulse 80  Temp(Src) 97.9 F (36.6 C) (Oral)  Resp 12  Ht  (1.702 m)  Wt 220 lb (99.791 kg)  BMI 34.45 kg/m2 GEN- NAD, alert and oriented x3 HEENT- PERRL, EOMI, non injected sclera, pink conjunctiva, MMM, oropharynx clear CVS- RRR, no murmur RESP-CTAB Skin- no hives or urticaria seen, Right breast small dime size scab with small amount of granulation tissue, no erythema no induration Pulses- Radial, DP- 2+        Assessment & Plan:      Problem List Items Addressed This Visit    None    Visit Diagnoses    Allergic reaction, subsequent encounter    -  Primary     based on history this is a late sulfa reaction. She will complete the prednisone add Benadryl. continue Ativan as needed rash clear   breast abscess is healing very nicely she does not need any further antibiotics for this.       Note: This dictation was prepared with Dragon dictation along with smaller phrase technology. Any transcriptional errors that result from this process are unintentional.

## 2016-01-11 ENCOUNTER — Encounter: Payer: Self-pay | Admitting: Family Medicine

## 2016-02-08 ENCOUNTER — Ambulatory Visit: Payer: 59 | Admitting: Family Medicine

## 2016-02-21 ENCOUNTER — Other Ambulatory Visit: Payer: Self-pay | Admitting: Family Medicine

## 2016-02-21 NOTE — Telephone Encounter (Signed)
Refill appropriate and filled per protocol. 

## 2016-03-17 ENCOUNTER — Ambulatory Visit (INDEPENDENT_AMBULATORY_CARE_PROVIDER_SITE_OTHER): Payer: BLUE CROSS/BLUE SHIELD | Admitting: Family Medicine

## 2016-03-17 ENCOUNTER — Encounter: Payer: Self-pay | Admitting: Family Medicine

## 2016-03-17 VITALS — BP 130/72 | HR 78 | Temp 98.2°F | Resp 12 | Ht 67.0 in | Wt 230.0 lb

## 2016-03-17 DIAGNOSIS — E538 Deficiency of other specified B group vitamins: Secondary | ICD-10-CM

## 2016-03-17 DIAGNOSIS — R5383 Other fatigue: Secondary | ICD-10-CM

## 2016-03-17 DIAGNOSIS — L02412 Cutaneous abscess of left axilla: Secondary | ICD-10-CM

## 2016-03-17 DIAGNOSIS — D509 Iron deficiency anemia, unspecified: Secondary | ICD-10-CM

## 2016-03-17 DIAGNOSIS — Z113 Encounter for screening for infections with a predominantly sexual mode of transmission: Secondary | ICD-10-CM

## 2016-03-17 LAB — COMPREHENSIVE METABOLIC PANEL
ALT: 14 U/L (ref 6–29)
AST: 21 U/L (ref 10–35)
Albumin: 4 g/dL (ref 3.6–5.1)
Alkaline Phosphatase: 97 U/L (ref 33–130)
BUN: 25 mg/dL (ref 7–25)
CHLORIDE: 107 mmol/L (ref 98–110)
CO2: 27 mmol/L (ref 20–31)
CREATININE: 0.87 mg/dL (ref 0.50–1.05)
Calcium: 8.8 mg/dL (ref 8.6–10.4)
GLUCOSE: 74 mg/dL (ref 70–99)
POTASSIUM: 4.2 mmol/L (ref 3.5–5.3)
SODIUM: 142 mmol/L (ref 135–146)
TOTAL PROTEIN: 7.5 g/dL (ref 6.1–8.1)
Total Bilirubin: 0.2 mg/dL (ref 0.2–1.2)

## 2016-03-17 LAB — CBC WITH DIFFERENTIAL/PLATELET
BASOS ABS: 0 10*3/uL (ref 0.0–0.1)
BASOS PCT: 0 % (ref 0–1)
EOS PCT: 1 % (ref 0–5)
Eosinophils Absolute: 0.1 10*3/uL (ref 0.0–0.7)
HCT: 31 % — ABNORMAL LOW (ref 36.0–46.0)
Hemoglobin: 10.1 g/dL — ABNORMAL LOW (ref 12.0–15.0)
LYMPHS PCT: 31 % (ref 12–46)
Lymphs Abs: 1.6 10*3/uL (ref 0.7–4.0)
MCH: 29.6 pg (ref 26.0–34.0)
MCHC: 32.6 g/dL (ref 30.0–36.0)
MCV: 90.9 fL (ref 78.0–100.0)
MONOS PCT: 11 % (ref 3–12)
MPV: 9 fL (ref 8.6–12.4)
Monocytes Absolute: 0.6 10*3/uL (ref 0.1–1.0)
NEUTROS ABS: 2.9 10*3/uL (ref 1.7–7.7)
Neutrophils Relative %: 57 % (ref 43–77)
PLATELETS: 428 10*3/uL — AB (ref 150–400)
RBC: 3.41 MIL/uL — ABNORMAL LOW (ref 3.87–5.11)
RDW: 15.2 % (ref 11.5–15.5)
WBC: 5.1 10*3/uL (ref 4.0–10.5)

## 2016-03-17 LAB — IRON: Iron: 52 ug/dL (ref 45–160)

## 2016-03-17 LAB — WET PREP FOR TRICH, YEAST, CLUE
TRICH WET PREP: NONE SEEN
YEAST WET PREP: NONE SEEN

## 2016-03-17 LAB — TSH: TSH: 0.47 mIU/L

## 2016-03-17 LAB — VITAMIN B12: VITAMIN B 12: 757 pg/mL (ref 200–1100)

## 2016-03-17 MED ORDER — PHENTERMINE HCL 37.5 MG PO TABS: 37.5000 mg | ORAL_TABLET | Freq: Every day | ORAL | Status: DC

## 2016-03-17 MED ORDER — CEPHALEXIN 500 MG PO CAPS: 500.0000 mg | ORAL_CAPSULE | Freq: Four times a day (QID) | ORAL | Status: DC

## 2016-03-17 NOTE — Addendum Note (Signed)
Addended by: Phillips OdorSIX, Laurajean Hosek H on: 03/17/2016 02:59 PM   Modules accepted: Orders

## 2016-03-17 NOTE — Progress Notes (Signed)
Patient ID: Amy Blair, female   DOB: 1966/03/05, 50 y.o.   MRN: [id number]    Subjective:    Patient ID: Amy Blair, female    DOB: 1966/03/05, 50 y.o.   MRN: [id number]  Patient presents for Knots to L Axilla and Fatigue Patient with complaint of knots beneath her axilla For the past 2 weeks. She's been using compresses but it has not drained. He has history of boils. We last did treat one that came up on her right breast.  She also complains of fatigue which has been worsening recently. 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 She also tells me that her surgeon is planning a delightful section on her thighs the first week of May.  She is also on a new relationship and will like a full STD check. Review Of Systems:  GEN- + fatigue, fever, weight loss,weakness, recent illness HEENT- denies eye drainage, change in vision, nasal discharge, CVS- denies chest pain, palpitations RESP- denies SOB, cough, wheeze ABD- denies N/V, change in stools, abd pain GU- denies dysuria, hematuria, dribbling, incontinence MSK- denies joint pain, muscle aches, injury Neuro- denies headache, dizziness, syncope, seizure activity       Objective:    BP 130/72 mmHg  Pulse 78  Temp(Src) 98.2 F (36.8 C) (Oral)  Resp 12  Ht 5\' 7"  (1.702 m)  Wt 230 lb (104.327 kg)  BMI 36.01 kg/m2 GEN- NAD, alert and oriented x3 Neck- Supple, no LAD CVS- RRR,2 /6SEM  Skin- Left axilla small dime size boil with pus at center, TTP, mild erythema, small indurated region 1 ich to left  GU- normal external genitalia, vaginal mucosa pink and moist, cervix visualized no growth, no blood form os, minimal thin clear discharge, no CMT, no ovarian masses, uterus normal size  Procedure- Incision and Drainage Procedure explained to  patient questions answered benefits and risks discussed verbal onsent obtained. Antiseptic-Betadine Anesthesia-lidocaine 2% Incision performed small  amount of pus expressed Minimal blood loss Patient tolerated procedure well Bandage applied       Assessment & Plan:      Problem List Items Addressed This Visit    Iron deficiency anemia - Primary    Recheck iron and B12 levels She also plans to restart phentermine with 1/2 tablet       Relevant Orders   CBC with Differential/Platelet   Comprehensive metabolic panel   Iron   B12 deficiency   Relevant Orders   CBC with Differential/Platelet   Vitamin B12    Other Visit Diagnoses    Other fatigue        Relevant Orders    TSH    Screen for STD (sexually transmitted disease)        Screen at request    Relevant Orders    HIV antibody    WET PREP FOR TRICH, YEAST, CLUE (Completed)    RPR    HSV(herpes smplx)abs-1+2(IgG+IgM)-bld    GC/Chlamydia Probe Amp    Abscess of axilla, left        s/p I and D, pus expressed, start Keflex, no packing needed       Note: This dictation was prepared with Dragon dictation along with smaller phrase technology. Any transcriptional errors that result from this process are unintentional.

## 2016-03-17 NOTE — Patient Instructions (Addendum)
We will call with lab results Take Keflex as prescribed  Restart phentermine with 1/2 tablet for 1 week F/U PHYSICAL In May

## 2016-03-17 NOTE — Assessment & Plan Note (Signed)
Recheck iron and B12 levels She also plans to restart phentermine with 1/2 tablet

## 2016-03-18 LAB — GC/CHLAMYDIA PROBE AMP
CT PROBE, AMP APTIMA: NOT DETECTED
GC PROBE AMP APTIMA: NOT DETECTED

## 2016-03-18 LAB — HIV ANTIBODY (ROUTINE TESTING W REFLEX): HIV: NONREACTIVE

## 2016-03-18 LAB — RPR

## 2016-03-20 ENCOUNTER — Encounter: Payer: Self-pay | Admitting: Family Medicine

## 2016-03-20 ENCOUNTER — Other Ambulatory Visit: Payer: Self-pay | Admitting: *Deleted

## 2016-03-20 LAB — WOUND CULTURE: GRAM STAIN: NONE SEEN

## 2016-03-20 LAB — HSV(HERPES SMPLX)ABS-I+II(IGG+IGM)-BLD
HSV 1 Glycoprotein G Ab, IgG: 17.2 Index — ABNORMAL HIGH (ref <0.90)
HSV 2 Glycoprotein G Ab, IgG: >23 Index — ABNORMAL HIGH (ref <0.90)
Herpes Simplex Vrs I&II-IgM Ab (EIA): 0.89 INDEX

## 2016-03-20 MED ORDER — CLINDAMYCIN HCL 300 MG PO CAPS
300.0000 mg | ORAL_CAPSULE | Freq: Three times a day (TID) | ORAL | Status: DC
Start: 2016-03-20 — End: 2016-05-29

## 2016-03-22 ENCOUNTER — Encounter: Payer: Self-pay | Admitting: Family Medicine

## 2016-03-22 ENCOUNTER — Ambulatory Visit (INDEPENDENT_AMBULATORY_CARE_PROVIDER_SITE_OTHER): Payer: BLUE CROSS/BLUE SHIELD | Admitting: Family Medicine

## 2016-03-22 VITALS — BP 126/70 | HR 82 | Temp 97.7°F | Resp 14 | Ht 67.0 in | Wt 230.0 lb

## 2016-03-22 DIAGNOSIS — B009 Herpesviral infection, unspecified: Secondary | ICD-10-CM | POA: Insufficient documentation

## 2016-03-22 DIAGNOSIS — L02412 Cutaneous abscess of left axilla: Secondary | ICD-10-CM | POA: Diagnosis not present

## 2016-03-22 MED ORDER — VALACYCLOVIR HCL 500 MG PO TABS: 500.0000 mg | ORAL_TABLET | Freq: Every day | ORAL | Status: DC

## 2016-03-22 NOTE — Progress Notes (Signed)
Patient ID: Catalina PizzaSherrill K Dias, female   DOB: 1966/04/21, 50 y.o.   MRN: [id number]    Subjective:    Patient ID: Catalina PizzaSherrill K Chappelle, female    DOB: 1966/04/21, 50 y.o.   MRN: [id number]  Patient presents for Recheck Axilla and Discuss Lab Results Patient here to follow-up labs and recheck of axilla. She had incision and drainage performed on axilla last Friday for abscess. Her culture returned with MRSA and she was switched from Keflex to clindamycin she has not had any difficulty with antibiotic and states that the abscess is much smaller.  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 She's never had any outbreaks of either source. She does have long-term partner but both have actually had other relationships in the past year and a half.    Review Of Systems:  GEN- denies fatigue, fever, weight loss,weakness, recent illness HEENT- denies eye drainage, change in vision, nasal discharge, CVS- denies chest pain, palpitations RESP- denies SOB, cough, wheeze ABD- denies N/V, change in stools, abd pain GU- denies dysuria, hematuria, dribbling, incontinence MSK- denies joint pain, muscle aches, injury Neuro- denies headache, dizziness, syncope, seizure activity       Objective:    BP 126/70 mmHg  Pulse 82  Temp(Src) 97.7 F (36.5 C) (Oral)  Resp 14  Ht 5\' 7"  (1.702 m)  Wt 230 lb (104.327 kg)  BMI 36.01 kg/m2 GEN- NAD, alert and oriented x3 Left Axilla- previous I and D site, D/C/I, minimal induration no erythema , non fluctuant        Assessment & Plan:      Problem List Items Addressed This Visit    HSV infection    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 States that her partner is currently being tested for sexual transmitted infections. After discussion about the illness although she has not had any outbreak she prefers to be on suppression therapy. We'll start Valtrex 500 mg once a day      Relevant Medications   valACYclovir (VALTREX) 500 MG tablet    Other Visit Diagnoses    Abscess of left axilla    -  Primary    MRSA on swab, complete Clindamycin, much improved today        Note: This dictation was prepared with Dragon dictation along with smaller phrase technology. Any transcriptional errors that result from this process are unintentional.

## 2016-03-22 NOTE — Assessment & Plan Note (Signed)
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 States that her partner is currently being tested for sexual transmitted infections. After discussion about the illness although she has not had any outbreak she prefers to be on suppression therapy. We'll start Valtrex 500 mg once a day

## 2016-03-22 NOTE — Patient Instructions (Signed)
Take medication daily as prescribed Complete antibiotics  F/U 4 months

## 2016-04-25 ENCOUNTER — Ambulatory Visit: Payer: BLUE CROSS/BLUE SHIELD | Admitting: Family Medicine

## 2016-04-28 ENCOUNTER — Ambulatory Visit: Payer: BLUE CROSS/BLUE SHIELD | Admitting: Family Medicine

## 2016-04-29 ENCOUNTER — Encounter: Payer: Self-pay | Admitting: Family Medicine

## 2016-05-19 ENCOUNTER — Other Ambulatory Visit: Payer: Self-pay | Admitting: Family Medicine

## 2016-05-19 NOTE — Telephone Encounter (Signed)
Refill appropriate and filled per protocol. 

## 2016-05-29 ENCOUNTER — Ambulatory Visit (INDEPENDENT_AMBULATORY_CARE_PROVIDER_SITE_OTHER): Payer: BLUE CROSS/BLUE SHIELD | Admitting: Family Medicine

## 2016-05-29 ENCOUNTER — Encounter: Payer: Self-pay | Admitting: Family Medicine

## 2016-05-29 VITALS — BP 128/74 | HR 72 | Temp 98.5°F | Resp 14 | Ht 67.0 in | Wt 224.0 lb

## 2016-05-29 DIAGNOSIS — R1084 Generalized abdominal pain: Secondary | ICD-10-CM

## 2016-05-29 DIAGNOSIS — Z9884 Bariatric surgery status: Secondary | ICD-10-CM

## 2016-05-29 NOTE — Progress Notes (Signed)
Patient ID: Amy Blair, female   DOB: Jun 28, 1966, 50 y.o.   MRN: [id number]    Subjective:    Patient ID: Amy Blair, female    DOB: Jun 28, 1966, 50 y.o.   MRN: [id number]  Patient presents for F/U and ABD Cramping After Eating Patient here with abdominal pain which is been intermittent over the past few months. She states that after she she gets a gnawing sensation in her stomach she feels like her bowels are being cramped up. She will sometimes on a stretcher lean over a chair this does help some. Other times she gets very nauseous and has to take promethazine. There are no particular foods but states that she will eat too many veggies at times and get this sensation. She does not eat any fried foods. 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 Her weight is down 6 pounds from last visit but she states that she actually gained some weight before her life with suction 3 weeks ago she was up to 240 pounds and she has been suddenly trying to lose by cutting her portions. She denies any change in her bowel she is using Linzess which helps her constipation denies any urinary symptoms.    Review Of Systems:  GEN- denies fatigue, fever, weight loss,weakness, recent illness HEENT- denies eye drainage, change in vision, nasal discharge, CVS- denies chest pain, palpitations RESP- denies SOB, cough, wheeze ABD- denies N/V, change in stools, +abd pain GU- denies dysuria, hematuria, dribbling, incontinence MSK- denies joint pain, muscle aches, injury Neuro- denies headache, dizziness, syncope, seizure activity       Objective:    BP 128/74 mmHg  Pulse 72  Temp(Src) 98.5 F (36.9 C) (Oral)  Resp 14  Ht 5\' 7"  (1.702 m)  Wt 224 lb (101.606 kg)  BMI 35.08 kg/m2 GEN- NAD, alert and oriented x3 HEENT- PERRL, EOMI, non injected sclera, pink conjunctiva, MMM, oropharynx clear CVS- RRR, 2/6 SEM  RESP-CTAB ABD-NABS,soft,NT,ND EXT- No edema Pulses- Radial   2+        Assessment & Plan:      Problem List Items Addressed This Visit    Gastric bypass status for obesity    Other Visit Diagnoses    Generalized abdominal pain    -  Primary    Her weight is slowly declining, but unsure if this is exercise related, her portion control or an asborption problems as she does get N/V with some meals. Plan to obtain CBC/CMET, insurance will not be active for another 3 weeks.  CT of abdomen t be done with contrast, with history of Roux-en-Y gastric bypass.        Note: This dictation was prepared with Dragon dictation along with smaller phrase technology. Any transcriptional errors that result from this process are unintentional.

## 2016-05-29 NOTE — Patient Instructions (Addendum)
CT scan done - with contrast Get the labs done at least 2 days before CT  1st week of July  F/U pending results

## 2016-06-14 ENCOUNTER — Other Ambulatory Visit: Payer: Self-pay | Admitting: Family Medicine

## 2016-06-14 DIAGNOSIS — Z9884 Bariatric surgery status: Secondary | ICD-10-CM

## 2016-06-14 DIAGNOSIS — R1084 Generalized abdominal pain: Secondary | ICD-10-CM

## 2016-06-19 ENCOUNTER — Other Ambulatory Visit: Payer: Self-pay

## 2016-06-22 ENCOUNTER — Ambulatory Visit (HOSPITAL_COMMUNITY): Payer: Self-pay

## 2016-06-30 ENCOUNTER — Ambulatory Visit: Payer: Self-pay | Admitting: Family Medicine

## 2016-06-30 ENCOUNTER — Encounter: Payer: Self-pay | Admitting: Family Medicine

## 2016-06-30 ENCOUNTER — Ambulatory Visit (INDEPENDENT_AMBULATORY_CARE_PROVIDER_SITE_OTHER): Payer: Self-pay | Admitting: Family Medicine

## 2016-06-30 VITALS — BP 148/90 | HR 88 | Temp 98.7°F | Resp 14 | Ht 67.0 in | Wt 231.0 lb

## 2016-06-30 DIAGNOSIS — M25511 Pain in right shoulder: Secondary | ICD-10-CM

## 2016-06-30 DIAGNOSIS — M25512 Pain in left shoulder: Secondary | ICD-10-CM

## 2016-06-30 DIAGNOSIS — R42 Dizziness and giddiness: Secondary | ICD-10-CM

## 2016-06-30 DIAGNOSIS — I10 Essential (primary) hypertension: Secondary | ICD-10-CM

## 2016-06-30 MED ORDER — MECLIZINE HCL 12.5 MG PO TABS
12.5000 mg | ORAL_TABLET | Freq: Three times a day (TID) | ORAL | Status: DC | PRN
Start: 2016-06-30 — End: 2017-10-29

## 2016-06-30 NOTE — Assessment & Plan Note (Signed)
Bp had been well controlled with meds, per above

## 2016-06-30 NOTE — Progress Notes (Addendum)
Patient ID: Amy Blair, female   DOB: 1966-07-31, 50 y.o.   MRN: [id number]    Subjective:    Patient ID: Amy Blair, female    DOB: 1966-07-31, 50 y.o.   MRN: [id number]  Patient presents for Vertigo and B Shoulder Pain Patient with dizzy episodes. For the past couple weeks she's had dizzy episodes she initially thought it was her blood pressure medicine therefore she just stopped taking it but she did not check her blood pressure to see if it was low. Her blood pressures quite elevated today. Of note we had difficulties with her maintaining her eating she often gets dizzy when she skips meals and she has been skipping meals because she is trying to lose another 30 pounds. At actually advised her to stop the phentermine but she took another pill of it today states that she didn't have any energy to work out because she was dizzy she got it she took the phentermine this would help her symptoms. She occasionally gets headaches which she takes Tylenol for and this helps relieve them.  Note -If she was evaluated by neurology back in October 2016 that she was having headaches, dizziness and feeling of being off balance had MRI of brain last year which showed nonspecific white matter hyperintensities She did have carotid Dopplers done which were normal    She's had bilateral shoulder pain for few years, worsening in  upper back pain secondary to the weight of her breasts she is a 36 after she has had a consultation with a plastic surgeon who recommends breast reduction to help with her overall discomfort and pain. She states her bra straps dig  into her shoulders causing significant pain  Review Of Systems:  GEN- denies fatigue, fever, weight loss,weakness, recent illness HEENT- denies eye drainage, change in vision, nasal discharge, CVS- denies chest pain, palpitations RESP- denies SOB, cough, wheeze ABD- denies N/V, change in stools, abd pain GU- denies dysuria, hematuria, dribbling,  incontinence MSK- + joint pain, +muscle aches, injury Neuro- denies headache, +dizziness, syncope, seizure activity       Objective:    BP 148/90 mmHg  Pulse 88  Temp(Src) 98.7 F (37.1 C) (Oral)  Resp 14  Ht 5\' 7"  (1.702 m)  Wt 231 lb (104.781 kg)  BMI 36.17 kg/m2 GEN- NAD, alert and oriented x3 HEENT- PERRL, EOMI, non injected sclera, pink conjunctiva, MMM, oropharynx clear, TM clear bilat no effusion  Neck- Supple, no bruit CVS- RRR, no murmur RESP-CTAB Neuro- CNII- XII intact, no focal deficits  MSK- Significant deep indentation bilat shuoulder with hyperpigmentation scabs on left shoulder- previous rubbing/abrarsion, TTP across shoulder, paraspinals TTP, Spine NT, good ROM spine Good ROM bilat shoulder, mild crepitus Right shoulder, rotator cuff in tact biceps in tact  EXT- No edema Pulses- Radial  2+        Assessment & Plan:      Problem List Items Addressed This Visit    Essential hypertension    Bp had been well controlled with meds, per above  Advised to D/C Phentermine I see no benefit at this time       Other Visit Diagnoses    Bilateral shoulder pain    -  Primary    Large breast size causig significant weight and strain on upper back, shoulders, recommend breast reduction    Dizziness and giddiness        This is MTF, not taking BP, meds so unsure if her BP was on  lower end with both meds, will have her monitor at home with taking lisinopril in AM and norvasc at Bedtime. If her blood pressures are indeed dropping low then I will decrease her medication. She other factor this is her poor nutrition. She is trying to cut weight as much as possible and think this is slowing her metabolism and also causing her to have dizziness and headaches. Advised that she needs eat regulary and eat more protein fiber to keep her full longer and also help with her weight loss. She has seen multiple nutritionist she still follows at a distance with her bariatric clinic I did  give meclizine short term in case of persistant dizziness despite correcting efforts above Pt to call office with BP numbers in 1 week     Relevant Medications    meclizine (ANTIVERT) 12.5 MG tablet       Note: This dictation was prepared with Dragon dictation along with smaller phrase technology. Any transcriptional errors that result from this process are unintentional.

## 2016-06-30 NOTE — Progress Notes (Signed)
Patient ID: Amy Blair, female   DOB: 1965-12-30, 50 y.o.   MRN: [id number]  Orthostatic Blood Pressure:  Lying: 142/ 84  Sitting: 148/ 90  Standing: 156/ 92

## 2016-06-30 NOTE — Patient Instructions (Signed)
Restart blood pressure medication Take 1 in the morning and 1 in the evening Check your blood pressures, call us with results in 1 week  Eat regulary  Take meclizine as needed  F/u as previous

## 2016-07-12 ENCOUNTER — Ambulatory Visit (INDEPENDENT_AMBULATORY_CARE_PROVIDER_SITE_OTHER): Payer: Self-pay | Admitting: Family Medicine

## 2016-07-12 ENCOUNTER — Encounter: Payer: Self-pay | Admitting: Family Medicine

## 2016-07-12 VITALS — BP 128/64 | HR 78 | Temp 97.9°F | Resp 14 | Ht 67.0 in | Wt 220.0 lb

## 2016-07-12 DIAGNOSIS — T148 Other injury of unspecified body region: Secondary | ICD-10-CM

## 2016-07-12 DIAGNOSIS — T148XXA Other injury of unspecified body region, initial encounter: Secondary | ICD-10-CM

## 2016-07-12 DIAGNOSIS — S39012A Strain of muscle, fascia and tendon of lower back, initial encounter: Secondary | ICD-10-CM

## 2016-07-12 MED ORDER — KETOROLAC TROMETHAMINE 60 MG/2ML IM SOLN
60.0000 mg | Freq: Once | INTRAMUSCULAR | Status: AC
  Administered 2016-07-12: 60 mg via INTRAMUSCULAR

## 2016-07-12 MED ORDER — CYCLOBENZAPRINE HCL 10 MG PO TABS: 10.0000 mg | ORAL_TABLET | Freq: Three times a day (TID) | ORAL | 1 refills | Status: DC | PRN

## 2016-07-12 MED ORDER — HYDROCODONE-ACETAMINOPHEN 5-325 MG PO TABS: 1.0000 | ORAL_TABLET | Freq: Four times a day (QID) | ORAL | 0 refills | Status: DC | PRN

## 2016-07-12 NOTE — Progress Notes (Signed)
Patient ID: Amy Blair, female   DOB: 1966/04/23, 50 y.o.   MRN: [id number]   Subjective:    Patient ID: Amy Blair, female    DOB: 09-05-1966, 50 y.o.   MRN: [id number]  Patient presents for Lower Abd Pain (x3 days- reports pain in middle of lower abd that shoots through to lumbar spine) and Skin Irritation (rash noted under L breast) Patient with back pain of note she was here just 2 weeks ago and at that time was complaining of bilateral shoulder pain due to her large bust size she was trying to get a reduction done. Also complaining of some dizziness and her blood pressure was elevated that she has stopped her medications we restarted lisinopril in the morning and amlodipine at bedtime.  Her back pain started on Sunday she states she been increasing her exercise trying to get her weight down. She been doing a lot of walking a lot of bending and stretching. She feels more or less [place] her lower back and is mostly midline she denies any radiating symptoms denies any change in bowel or bladder. She took a few doses of Aleve which helps some. She noted pain when she was trying to get up into her car.  She also states that when she works out she wears some type of belly wrap and she has some irritation beneath her right breast and a scab that open up on her abdomen where the pain was rubbing.      Review Of Systems:  GEN- denies fatigue, fever, weight loss,weakness, recent illness HEENT- denies eye drainage, change in vision, nasal discharge, CVS- denies chest pain, palpitations RESP- denies SOB, cough, wheeze ABD- denies N/V, change in stools, abd pain GU- denies dysuria, hematuria, dribbling, incontinence MSK- + joint pain, muscle aches, injury Neuro- denies headache, dizziness, syncope, seizure activity       Objective:    BP 128/64 (BP Location: Left Arm, Patient Position: Sitting, Cuff Size: Large)   Pulse 78   Temp 97.9 F (36.6 C) (Oral)   Resp 14   Ht 5\' 7"   (1.702 m)   Wt 220 lb (99.8 kg)   BMI 34.46 kg/m  GEN- NAD, alert and oriented x3 Skin- moisture beneath breast, no erythema, few moles, right upper abdomen, pencil erasor size scab, no erythema, no drainage  MSK- TTP lumbar spine, +paraspinal spasm, neg SLR, fair ROM hips, decreasd ROM lumbar spine (stiff, antalgic gait) motor equal bilat LE Neuro- normal tone, DTR symmetric, sensation in tact  EXT- No edema Pulses- Radial, DP- 2+        Assessment & Plan:      Problem List Items Addressed This Visit    None    Visit Diagnoses    Low back strain, initial encounter    -  Primary   No red flags, as she has had baraictic surgey, try to limit oral NSAIDS, gien TOradol, given Norco #20 tab and flexeril, no imaging needed at this time    Relevant Medications   ketorolac (TORADOL) injection 60 mg (Completed)   Skin abrasion       No evidence of infection, more from rubbing of tight band she wears while exercising, given triple antibiotic cream from office. Also advised to use Gold Bonds powder beneath breast, to help with chaffing, she has large breast size, moisture also leads to yeast growth       Note: This dictation was prepared with Dragon dictation along with smaller phrase technology.  Any transcriptional errors that result from this process are unintentional.

## 2016-07-12 NOTE — Patient Instructions (Signed)
Take pain medicine and flexeril as needed for back Use antibiotic cream Try GOlds Bond Powder beneath breast F/U as needed

## 2016-07-26 ENCOUNTER — Ambulatory Visit: Payer: BLUE CROSS/BLUE SHIELD | Admitting: Family Medicine

## 2016-09-28 ENCOUNTER — Telehealth: Payer: Self-pay | Admitting: *Deleted

## 2016-09-28 NOTE — Telephone Encounter (Signed)
Received fax requesting refill on phentermine.   Ok to refill??  Last office visit 07/12/2016.  Last refill 03/17/2016, #2 refills.

## 2016-09-29 MED ORDER — PHENTERMINE HCL 37.5 MG PO CAPS
37.5000 mg | ORAL_CAPSULE | ORAL | 0 refills | Status: DC
Start: 2016-09-29 — End: 2017-04-09

## 2016-09-29 NOTE — Telephone Encounter (Signed)
Medication called to pharmacy. 

## 2016-09-29 NOTE — Telephone Encounter (Signed)
Give 1 refill, only, needs OV in 1 month We will discuss at OV

## 2017-01-18 ENCOUNTER — Telehealth: Payer: Self-pay | Admitting: *Deleted

## 2017-01-18 NOTE — Telephone Encounter (Signed)
Received fax requesting refill on Phentermine.   Refill denied. Requires office visit before any further refills can be given.   Of note, last office visit 07/12/2016. last refill  09/29/2016, #3 refill.

## 2017-01-22 ENCOUNTER — Encounter: Payer: Self-pay | Admitting: Family Medicine

## 2017-01-22 ENCOUNTER — Ambulatory Visit (INDEPENDENT_AMBULATORY_CARE_PROVIDER_SITE_OTHER): Payer: BLUE CROSS/BLUE SHIELD | Admitting: Family Medicine

## 2017-01-22 VITALS — BP 130/72 | HR 72 | Temp 98.5°F | Resp 14 | Ht 67.0 in | Wt 242.0 lb

## 2017-01-22 DIAGNOSIS — Z6837 Body mass index (BMI) 37.0-37.9, adult: Secondary | ICD-10-CM | POA: Diagnosis not present

## 2017-01-22 DIAGNOSIS — I10 Essential (primary) hypertension: Secondary | ICD-10-CM | POA: Diagnosis not present

## 2017-01-22 DIAGNOSIS — G43709 Chronic migraine without aura, not intractable, without status migrainosus: Secondary | ICD-10-CM | POA: Diagnosis not present

## 2017-01-22 DIAGNOSIS — E6609 Other obesity due to excess calories: Secondary | ICD-10-CM | POA: Diagnosis not present

## 2017-01-22 DIAGNOSIS — Z9884 Bariatric surgery status: Secondary | ICD-10-CM

## 2017-01-22 DIAGNOSIS — IMO0001 Reserved for inherently not codable concepts without codable children: Secondary | ICD-10-CM

## 2017-01-22 LAB — CBC WITH DIFFERENTIAL/PLATELET
Basophils Absolute: 0 cells/uL (ref 0–200)
Basophils Relative: 0 %
EOS PCT: 1 %
Eosinophils Absolute: 50 cells/uL (ref 15–500)
HCT: 32.6 % — ABNORMAL LOW (ref 35.0–45.0)
HEMOGLOBIN: 10.3 g/dL — AB (ref 12.0–15.0)
LYMPHS ABS: 2100 {cells}/uL (ref 850–3900)
Lymphocytes Relative: 42 %
MCH: 29.4 pg (ref 27.0–33.0)
MCHC: 31.6 g/dL — ABNORMAL LOW (ref 32.0–36.0)
MCV: 93.1 fL (ref 80.0–100.0)
MPV: 9 fL (ref 7.5–12.5)
Monocytes Absolute: 450 cells/uL (ref 200–950)
Monocytes Relative: 9 %
NEUTROS ABS: 2400 {cells}/uL (ref 1500–7800)
Neutrophils Relative %: 48 %
Platelets: 462 10*3/uL — ABNORMAL HIGH (ref 140–400)
RBC: 3.5 MIL/uL — AB (ref 3.80–5.10)
RDW: 13.9 % (ref 11.0–15.0)
WBC: 5 10*3/uL (ref 3.8–10.8)

## 2017-01-22 LAB — COMPREHENSIVE METABOLIC PANEL
ALBUMIN: 4.1 g/dL (ref 3.6–5.1)
ALT: 16 U/L (ref 6–29)
AST: 24 U/L (ref 10–35)
Alkaline Phosphatase: 100 U/L (ref 33–130)
BILIRUBIN TOTAL: 0.3 mg/dL (ref 0.2–1.2)
BUN: 21 mg/dL (ref 7–25)
CO2: 27 mmol/L (ref 20–31)
CREATININE: 0.74 mg/dL (ref 0.50–1.05)
Calcium: 9.2 mg/dL (ref 8.6–10.4)
Chloride: 109 mmol/L (ref 98–110)
Glucose, Bld: 78 mg/dL (ref 70–99)
Potassium: 4.3 mmol/L (ref 3.5–5.3)
SODIUM: 144 mmol/L (ref 135–146)
TOTAL PROTEIN: 7.8 g/dL (ref 6.1–8.1)

## 2017-01-22 MED ORDER — PROMETHAZINE HCL 25 MG PO TABS: 25.0000 mg | ORAL_TABLET | Freq: Three times a day (TID) | ORAL | 2 refills | Status: DC | PRN

## 2017-01-22 MED ORDER — HYDROCODONE-ACETAMINOPHEN 5-325 MG PO TABS: 1.0000 | ORAL_TABLET | Freq: Four times a day (QID) | ORAL | 0 refills | Status: DC | PRN

## 2017-01-22 MED ORDER — LINACLOTIDE 145 MCG PO CAPS: 145.0000 ug | ORAL_CAPSULE | Freq: Every day | ORAL | 1 refills | Status: DC

## 2017-01-22 MED ORDER — LIRAGLUTIDE -WEIGHT MANAGEMENT 18 MG/3ML ~~LOC~~ SOPN: 0.6000 mg | PEN_INJECTOR | Freq: Every day | SUBCUTANEOUS | 2 refills | Status: DC

## 2017-01-22 MED ORDER — CYCLOBENZAPRINE HCL 10 MG PO TABS
10.0000 mg | ORAL_TABLET | Freq: Three times a day (TID) | ORAL | 1 refills | Status: DC | PRN
Start: 2017-01-22 — End: 2020-08-10

## 2017-01-22 MED ORDER — SUMATRIPTAN SUCCINATE 100 MG PO TABS: ORAL_TABLET | ORAL | 1 refills | Status: DC

## 2017-01-22 NOTE — Patient Instructions (Addendum)
For migraine use muscle relaxer, phenergan for nausea norco for pain Try the saxenda  F/U 2 months

## 2017-01-22 NOTE — Assessment & Plan Note (Signed)
She had been off of her medications now she has insurance. We'll restart her Imitrex she also has antiemetic. I do given her Norco which is working the past we're avoiding anti-inflammatories that she has had a gastric bypass. She also has muscle relaxers she gets a lot of tension and stress.

## 2017-01-22 NOTE — Assessment & Plan Note (Signed)
>>  ASSESSMENT AND PLAN FOR CLASS 2 OBESITY WRITTEN ON 01/22/2017 11:50 AM BY Milinda Antis F  She's been on 20 pounds since her last visit in July. I do not think that she'll benefit from the interior meaning that she has tried this multiple times. Today we started Saxenda for weight loss .

## 2017-01-22 NOTE — Assessment & Plan Note (Signed)
She's been on 20 pounds since her last visit in July. I do not think that she'll benefit from the interior meaning that she has tried this multiple times. Today we started Saxenda for weight loss .

## 2017-01-22 NOTE — Assessment & Plan Note (Signed)
Well controlled no changes 

## 2017-01-22 NOTE — Progress Notes (Signed)
   Subjective:    Patient ID: Amy Blair, female    DOB: 07/13/1966, 51 y.o.   MRN: [id number]  Patient presents for HA (x3 days) and Medication Management (would like to resume pentermine)  Patient here with headache for the past 3 days. Feels like her typical migraines. She is out of her migraine medication. She now has insurance and can get her medications back. 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 She has had some nausea with her headache now noticed more of a slight dull pain. She does request a refill in the hydrocodone as well.  Obesity she has gained 20 pounds since July she admits that she has been stress eating tried to take care of family matters. She has history of gastric bypass. She wanted to go back on phentermine despite  she has done this multiple times without sustaining any weight loss. She states she is also back with her trainer.   Review Of Systems:  GEN- denies fatigue, fever, weight loss,weakness, recent illness HEENT- denies eye drainage, change in vision, nasal discharge, CVS- denies chest pain, palpitations RESP- denies SOB, cough, wheeze ABD- denies N/V, change in stools, abd pain GU- denies dysuria, hematuria, dribbling, incontinence MSK- denies joint pain, muscle aches, injury Neuro- +headache, denies dizziness, syncope, seizure activity       Objective:    BP 130/72   Pulse 72   Temp 98.5 F (36.9 C) (Oral)   Resp 14   Ht 5\' 7"  (1.702 m)   Wt 242 lb (109.8 kg)   SpO2 99%   BMI 37.90 kg/m  GEN- NAD, alert and oriented x3 HEENT- PERRL, EOMI, non injected sclera, pink conjunctiva, MMM, oropharynx clear Neck- Supple, no thyromegaly CVS- RRR, 2/6 SEM RESP-CTAB Neuro-CNII-XII in tact, no deficits  EXT- No edema Pulses- Radial - 2+        Assessment & Plan:      Problem List Items Addressed This Visit    Obesity    She's been on  20 pounds since her last visit in July. I do not think that she'll benefit from the interior meaning that she has tried this multiple times. Today we started Saxenda for weight loss .      Relevant Medications   Liraglutide -Weight Management (SAXENDA) 18 MG/3ML SOPN   Migraine headache    She had been off of her medications now she has insurance. We'll restart her Imitrex she also has antiemetic. I do given her Norco which is working the past we're avoiding anti-inflammatories that she has had a gastric bypass. She also has muscle relaxers she gets a lot of tension and stress.      Relevant Medications   cyclobenzaprine (FLEXERIL) 10 MG tablet   HYDROcodone-acetaminophen (NORCO) 5-325 MG tablet   SUMAtriptan (IMITREX) 100 MG tablet   Other Relevant Orders   CBC with Differential/Platelet   Comprehensive metabolic panel   Gastric bypass status for obesity - Primary   Relevant Orders   CBC with Differential/Platelet   Comprehensive metabolic panel   Essential hypertension    Well controlled no changes          Note: This dictation was prepared with Dragon dictation along with smaller phrase technology. Any transcriptional errors that result from this process are unintentional.

## 2017-01-26 ENCOUNTER — Other Ambulatory Visit: Payer: Self-pay | Admitting: Family Medicine

## 2017-02-12 ENCOUNTER — Telehealth: Payer: Self-pay | Admitting: *Deleted

## 2017-02-12 NOTE — Telephone Encounter (Signed)
Received request from pharmacy for PA on Saxenda.   PA submitted.   Dx: E66.9- obesity.   Patient can't take Belviq D/T concurrent treatment of depression/ anxiety.  Patient can't take Qsymia D/T concurrent treatment of HTN.

## 2017-02-13 MED ORDER — LIRAGLUTIDE -WEIGHT MANAGEMENT 18 MG/3ML ~~LOC~~ SOPN: 0.6000 mg | PEN_INJECTOR | Freq: Every day | SUBCUTANEOUS | 2 refills | Status: DC

## 2017-02-13 NOTE — Addendum Note (Signed)
Addended by: Phillips OdorSIX, Aiesha Leland H on: 02/13/2017 01:23 PM   Modules accepted: Orders

## 2017-02-13 NOTE — Telephone Encounter (Signed)
Received PA determination.   PA approved 02/12/2017 through 06/17/2017.  Pharmacy made aware.

## 2017-03-05 ENCOUNTER — Ambulatory Visit: Payer: Self-pay | Admitting: Family Medicine

## 2017-03-05 ENCOUNTER — Telehealth: Payer: Self-pay | Admitting: Family Medicine

## 2017-03-05 NOTE — Telephone Encounter (Signed)
Pt needs refill on saxenda sent to [facility].

## 2017-03-05 NOTE — Telephone Encounter (Signed)
Patient NS for weight check appointment.   Re-scheduled for 03/06/2017.

## 2017-03-06 ENCOUNTER — Encounter: Payer: Self-pay | Admitting: Family Medicine

## 2017-03-06 ENCOUNTER — Ambulatory Visit (INDEPENDENT_AMBULATORY_CARE_PROVIDER_SITE_OTHER): Payer: BLUE CROSS/BLUE SHIELD | Admitting: Family Medicine

## 2017-03-06 VITALS — BP 136/82 | HR 88 | Temp 98.2°F | Resp 14 | Ht 67.0 in | Wt 229.0 lb

## 2017-03-06 DIAGNOSIS — E6609 Other obesity due to excess calories: Secondary | ICD-10-CM | POA: Diagnosis not present

## 2017-03-06 DIAGNOSIS — I10 Essential (primary) hypertension: Secondary | ICD-10-CM

## 2017-03-06 DIAGNOSIS — Z9884 Bariatric surgery status: Secondary | ICD-10-CM | POA: Diagnosis not present

## 2017-03-06 DIAGNOSIS — IMO0001 Reserved for inherently not codable concepts without codable children: Secondary | ICD-10-CM

## 2017-03-06 DIAGNOSIS — Z6837 Body mass index (BMI) 37.0-37.9, adult: Secondary | ICD-10-CM | POA: Diagnosis not present

## 2017-03-06 MED ORDER — INSULIN PEN NEEDLE 32G X 6 MM MISC: 3 refills | Status: DC

## 2017-03-06 NOTE — Progress Notes (Signed)
   Subjective:    Patient ID: Amy Blair, female    DOB: 1966/10/06, 51 y.o.   MRN: [id number]  Patient presents for Follow-up (weight loss)  Here to follow-up weight loss. She was started [place]ax[place] about 5 weeks ago. She has lost 13 pounds in the past 4 weeks. She has been exercising on a regular basis and is also decreased her portion size that she has cut back on her protein which she knows is very important that she increases as she has had gastric bypass. She is also heading into a breast reduction surgery in about 4 weeks. She's not had any side effects with the medication no nausea vomiting no change in her bowels. She has no particular concerns today.   Review Of Systems:  GEN- denies fatigue, fever, weight loss,weakness, recent illness HEENT- denies eye drainage, change in vision, nasal discharge, CVS- denies chest pain, palpitations RESP- denies SOB, cough, wheeze ABD- denies N/V, change in stools, abd pain GU- denies dysuria, hematuria, dribbling, incontinence MSK- denies joint pain, muscle aches, injury Neuro- denies headache, dizziness, syncope, seizure activity       Objective:    BP 136/82   Pulse 88   Temp 98.2 F (36.8 C) (Oral)   Resp 14   Ht 5\' 7"  (1.702 m)   Wt 229 lb (103.9 kg)   SpO2 99%   BMI 35.87 kg/m  GEN- NAD, alert and oriented x3 HEENT- PERRL, EOMI, non injected sclera, pink conjunctiva, MMM, oropharynx clear Neck- Supple, no thyromegaly CVS- RRR, systolic murmur RESP-CTAB ABD-NABS,soft,NT,ND EXT- No edema Pulses- Radial 2+        Assessment & Plan:      Problem List Items Addressed This Visit    Obesity - Primary    She has had good success with the weight loss medication. We will continue at the current dose Saxenda 3 mg once a day. Recommended that she increase her protein and decrease her snack food which has been pretzels as this is  mostly carbs.      Gastric bypass status for obesity   Essential hypertension    Her  blood pressures controlled. Her systolic murmur has been evaluated. She is safe to undergo breast reduction surgery.         Note: This dictation was prepared with Dragon dictation along with smaller phrase technology. Any transcriptional errors that result from this process are unintentional.

## 2017-03-06 NOTE — Assessment & Plan Note (Signed)
>>  ASSESSMENT AND PLAN FOR CLASS 2 OBESITY WRITTEN ON 03/06/2017  5:50 PM BY Milinda Antis F  She has had good success with the weight loss medication. We will continue at the current dose Saxenda 3 mg once a day. Recommended that she increase her protein and decrease her snack food which has been pretzels as this is  mostly carbs.

## 2017-03-06 NOTE — Assessment & Plan Note (Signed)
Her blood pressures controlled. Her systolic murmur has been evaluated. She is safe to undergo breast reduction surgery.

## 2017-03-06 NOTE — Patient Instructions (Signed)
F/U  3 months 

## 2017-03-06 NOTE — Assessment & Plan Note (Addendum)
She has had good success with the weight loss medication. We will continue at the current dose Saxenda 3 mg once a day. Recommended that she increase her protein and decrease her snack food which has been pretzels as this is  mostly carbs.

## 2017-03-15 DIAGNOSIS — N62 Hypertrophy of breast: Secondary | ICD-10-CM | POA: Diagnosis not present

## 2017-03-15 DIAGNOSIS — M542 Cervicalgia: Secondary | ICD-10-CM | POA: Diagnosis not present

## 2017-03-15 DIAGNOSIS — M546 Pain in thoracic spine: Secondary | ICD-10-CM | POA: Diagnosis not present

## 2017-03-15 DIAGNOSIS — M25519 Pain in unspecified shoulder: Secondary | ICD-10-CM | POA: Diagnosis not present

## 2017-03-27 ENCOUNTER — Ambulatory Visit: Payer: BLUE CROSS/BLUE SHIELD | Admitting: Family Medicine

## 2017-04-09 ENCOUNTER — Ambulatory Visit (INDEPENDENT_AMBULATORY_CARE_PROVIDER_SITE_OTHER): Payer: BLUE CROSS/BLUE SHIELD | Admitting: Family Medicine

## 2017-04-09 ENCOUNTER — Encounter: Payer: Self-pay | Admitting: Family Medicine

## 2017-04-09 VITALS — BP 130/80 | HR 66 | Temp 98.4°F | Resp 16 | Ht 67.0 in | Wt 227.0 lb

## 2017-04-09 DIAGNOSIS — N898 Other specified noninflammatory disorders of vagina: Secondary | ICD-10-CM

## 2017-04-09 DIAGNOSIS — B009 Herpesviral infection, unspecified: Secondary | ICD-10-CM | POA: Diagnosis not present

## 2017-04-09 DIAGNOSIS — R300 Dysuria: Secondary | ICD-10-CM | POA: Diagnosis not present

## 2017-04-09 LAB — URINALYSIS, ROUTINE W REFLEX MICROSCOPIC
Bilirubin Urine: NEGATIVE
GLUCOSE, UA: NEGATIVE
HGB URINE DIPSTICK: NEGATIVE
Ketones, ur: NEGATIVE
LEUKOCYTES UA: NEGATIVE
NITRITE: NEGATIVE
PH: 5.5 (ref 5.0–8.0)
PROTEIN: NEGATIVE
Specific Gravity, Urine: 1.02 (ref 1.001–1.035)

## 2017-04-09 LAB — WET PREP FOR TRICH, YEAST, CLUE
CLUE CELLS WET PREP: NONE SEEN
TRICH WET PREP: NONE SEEN
Yeast Wet Prep HPF POC: NONE SEEN

## 2017-04-09 MED ORDER — VALACYCLOVIR HCL 1 G PO TABS: 1000.0000 mg | ORAL_TABLET | Freq: Two times a day (BID) | ORAL | 0 refills | Status: DC

## 2017-04-09 NOTE — Assessment & Plan Note (Signed)
More  HSV related with papular lesions and burning Use vagisil, given valtrex  BID for 7 days UA and wet prep are negative    She declined anything at this time for her grief will let me know if she needs any assistance

## 2017-04-09 NOTE — Patient Instructions (Addendum)
Take valtrex   twice a day for 1 week  F/U as needed

## 2017-04-09 NOTE — Progress Notes (Signed)
   Subjective:    Patient ID: Amy Blair, female    DOB: 1966-03-08, 51 y.o.   MRN: [id number]  Patient presents for Dysuria (x5 days- discomfort with urination, urgency)  Pt here with dysuria for the past 5 days, also has some mild vaginal discharge and some itchy bumps onlabia She has known HSV. No new sexual partners. When asked about stress, 2 weeks ago her daughter who was 76 years old, died suddenly, autopsy is still pending. She has support from her sister and friends but each day is difficult. States she needs closure on what caused her death.      Review Of Systems:  GEN- denies fatigue, fever, weight loss,weakness, recent illness HEENT- denies eye drainage, change in vision, nasal discharge, CVS- denies chest pain, palpitations RESP- denies SOB, cough, wheeze ABD- denies N/V, change in stools, abd pain GU- denies dysuria, hematuria, dribbling, incontinence MSK- denies joint pain, muscle aches, injury Neuro- denies headache, dizziness, syncope, seizure activity       Objective:    BP 130/80   Pulse 66   Temp 98.4 F (36.9 C) (Oral)   Resp 16   Ht  (1.702 m)   Wt 227 lb (103 kg)   SpO2 99%   BMI 35.55 kg/m  GEN- NAD, alert and oriented x3 ABD-NABS,soft,NT,ND Breast- normal symmetry, no nipple inversion,no nipple drainage, no nodules or lumps felt Nodes- no axillary nodes GU- normal external genitalia, fews small fleshed toned papular lesions in crease of labia majora , no excoriations vaginal mucosa pink and moist, cervix visualized no growth, no blood form os, minimal thin clear discharge, no CMT, no ovarian masses, uterus normal size Psych- tearful,depressed affect, not anxious appearing, no SI          Assessment & Plan:      Problem List Items Addressed This Visit    HSV infection    More  HSV related with papular lesions and burning Use vagisil, given valtrex  BID for 7 days UA and wet prep are negative    She declined anything  at this time for her grief will let me know if she needs any assistance      Relevant Medications   valACYclovir (VALTREX) 1000 MG tablet    Other Visit Diagnoses    Dysuria    -  Primary   Relevant Orders   Urinalysis, Routine w reflex microscopic (Completed)   Vaginal discharge       Relevant Orders   WET PREP FOR TRICH, YEAST, CLUE (Completed)      Note: This dictation was prepared with Dragon dictation along with smaller phrase technology. Any transcriptional errors that result from this process are unintentional.

## 2017-04-11 DIAGNOSIS — N62 Hypertrophy of breast: Secondary | ICD-10-CM | POA: Diagnosis not present

## 2017-04-11 DIAGNOSIS — M546 Pain in thoracic spine: Secondary | ICD-10-CM | POA: Diagnosis not present

## 2017-04-11 DIAGNOSIS — M25519 Pain in unspecified shoulder: Secondary | ICD-10-CM | POA: Diagnosis not present

## 2017-04-11 DIAGNOSIS — M542 Cervicalgia: Secondary | ICD-10-CM | POA: Diagnosis not present

## 2017-04-16 ENCOUNTER — Encounter: Payer: Self-pay | Admitting: Family Medicine

## 2017-04-23 DIAGNOSIS — N62 Hypertrophy of breast: Secondary | ICD-10-CM | POA: Diagnosis not present

## 2017-04-26 DIAGNOSIS — M25519 Pain in unspecified shoulder: Secondary | ICD-10-CM | POA: Diagnosis not present

## 2017-04-26 DIAGNOSIS — M25511 Pain in right shoulder: Secondary | ICD-10-CM | POA: Diagnosis not present

## 2017-04-26 DIAGNOSIS — M1990 Unspecified osteoarthritis, unspecified site: Secondary | ICD-10-CM | POA: Diagnosis not present

## 2017-04-26 DIAGNOSIS — M549 Dorsalgia, unspecified: Secondary | ICD-10-CM | POA: Diagnosis not present

## 2017-04-26 DIAGNOSIS — E119 Type 2 diabetes mellitus without complications: Secondary | ICD-10-CM | POA: Diagnosis not present

## 2017-04-26 DIAGNOSIS — I10 Essential (primary) hypertension: Secondary | ICD-10-CM | POA: Diagnosis not present

## 2017-04-26 DIAGNOSIS — Z79899 Other long term (current) drug therapy: Secondary | ICD-10-CM | POA: Diagnosis not present

## 2017-04-26 DIAGNOSIS — M25512 Pain in left shoulder: Secondary | ICD-10-CM | POA: Diagnosis not present

## 2017-04-26 DIAGNOSIS — M546 Pain in thoracic spine: Secondary | ICD-10-CM | POA: Diagnosis not present

## 2017-04-26 DIAGNOSIS — M542 Cervicalgia: Secondary | ICD-10-CM | POA: Diagnosis not present

## 2017-04-26 DIAGNOSIS — R21 Rash and other nonspecific skin eruption: Secondary | ICD-10-CM | POA: Diagnosis not present

## 2017-04-26 DIAGNOSIS — N62 Hypertrophy of breast: Secondary | ICD-10-CM | POA: Diagnosis not present

## 2017-04-28 HISTORY — PX: REDUCTION MAMMAPLASTY: SUR839

## 2017-05-21 ENCOUNTER — Encounter: Payer: Self-pay | Admitting: Family Medicine

## 2017-05-23 ENCOUNTER — Other Ambulatory Visit: Payer: Self-pay | Admitting: Family Medicine

## 2017-05-28 ENCOUNTER — Encounter: Payer: Self-pay | Admitting: Family Medicine

## 2017-05-28 ENCOUNTER — Ambulatory Visit (INDEPENDENT_AMBULATORY_CARE_PROVIDER_SITE_OTHER): Payer: BLUE CROSS/BLUE SHIELD | Admitting: Family Medicine

## 2017-05-28 VITALS — BP 132/78 | HR 82 | Temp 98.1°F | Resp 14 | Ht 67.0 in | Wt 224.0 lb

## 2017-05-28 DIAGNOSIS — R05 Cough: Secondary | ICD-10-CM | POA: Diagnosis not present

## 2017-05-28 DIAGNOSIS — H578 Other specified disorders of eye and adnexa: Secondary | ICD-10-CM

## 2017-05-28 DIAGNOSIS — R059 Cough, unspecified: Secondary | ICD-10-CM

## 2017-05-28 DIAGNOSIS — F4321 Adjustment disorder with depressed mood: Secondary | ICD-10-CM | POA: Diagnosis not present

## 2017-05-28 DIAGNOSIS — G43709 Chronic migraine without aura, not intractable, without status migrainosus: Secondary | ICD-10-CM

## 2017-05-28 DIAGNOSIS — H5789 Other specified disorders of eye and adnexa: Secondary | ICD-10-CM

## 2017-05-28 MED ORDER — SUMATRIPTAN SUCCINATE 100 MG PO TABS: ORAL_TABLET | ORAL | 1 refills | Status: DC

## 2017-05-28 MED ORDER — CIPROFLOXACIN HCL 0.3 % OP SOLN: OPHTHALMIC | 0 refills | Status: DC

## 2017-05-28 NOTE — Assessment & Plan Note (Signed)
increaesd stress, leading to more headaches She wants to continue with imitrex as needed Planning to leave for a vacation this week  She thinks removing client from her home will also help a lot

## 2017-05-28 NOTE — Patient Instructions (Addendum)
Try the zyrtec first for allergies  Use imitrex for migraines Use eye drop if eyes worsen Change F/U to August

## 2017-05-28 NOTE — Progress Notes (Signed)
   Subjective:    Patient ID: Amy PizzaSherrill K Placzek, female    DOB: 04-28-1966, 51 y.o.   MRN: [id number]  Patient presents for Cough (frequent nonproductive cough) and Headache (frequent HA)   Cough for past 2 weeks, worse at night, but when drinking liquids has a cough, non productive cough, does not feel sick. Has had  some mucous in eyes  Past week, started after she slept with contact lenses in.  Note- last week Restarted Saxenda at 3mg  had been off for a month or so for her surgery, has had some headaches, nausea a few days   Heaaches on and off for past fo rpast few week,s has taken few doses of imitrex needs refill. 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 She is planning to have her removed from her own. Also still grieving her daughters sudden death.        Review Of Systems:  GEN- denies fatigue, fever, weight loss,weakness, recent illness HEENT- + eye drainage, change in vision, nasal discharge, CVS- denies chest pain, palpitations RESP- denies SOB, +cough, wheeze ABD- denies N/V, change in stools, abd pain GU- denies dysuria, hematuria, dribbling, incontinence MSK- denies joint pain, muscle aches, injury Neuro- denies headache, dizziness, syncope, seizure activity       Objective:    BP 132/78   Pulse 82   Temp 98.1 F (36.7 C) (Oral)   Resp 14   Ht 5\' 7"  (1.702 m)   Wt 224 lb (101.6 kg)   SpO2 99%   BMI 35.08 kg/m  GEN- NAD, alert and oriented x3 HEENT- PERRL, EOMI, non injected sclera, pink conjunctiva, white discharge bilat eyes MMM, oropharynx clear Neck- Supple, no thyromegaly CVS- RRR, no murmur RESP-CTAB Psych- tearful at times, otherwise normal affect and mood  Neuro-CNII-XII in tact  Pulses- Radial 2+        Assessment & Plan:      Problem List Items Addressed This Visit    Migraine headache    increaesd stress, leading to more headaches She wants to continue with imitrex as  needed Planning to leave for a vacation this week  She thinks removing client from her home will also help a lot      Relevant Medications   SUMAtriptan (IMITREX) 100 MG tablet    Other Visit Diagnoses    Cough    -  Primary   DD allergies, reflux especially with cough. She has been told has some narrowing esophogus in past but she does not want this worked up. Wants to try allergy me   Eye drainage       no overt sign of conjuncvitis but concerned continued drainage, advised to not use contacts, given cipro drops, has appt eye doctor end of month   Situational depression       she declines any help, has supportive family      Note: This dictation was prepared with Dragon dictation along with smaller phrase technology. Any transcriptional errors that result from this process are unintentional.

## 2017-06-06 ENCOUNTER — Ambulatory Visit: Payer: BLUE CROSS/BLUE SHIELD | Admitting: Family Medicine

## 2017-06-07 ENCOUNTER — Telehealth: Payer: Self-pay | Admitting: *Deleted

## 2017-06-07 NOTE — Telephone Encounter (Signed)
Received request from pharmacy for re-certification on PA for Saxenda.   PA submitted.   Dx: E66.01- obesity.

## 2017-06-07 NOTE — Telephone Encounter (Signed)
Your information has been submitted to [employer]. [employer] [place] will review the request and fax you a determination directly, typically within 3 business days of your submission once all necessary information is received.  If [employer] [place] has not responded in 3 business days or if you have any questions about your submission, contact [employer] [place] at [phone]. 

## 2017-06-11 MED ORDER — LIRAGLUTIDE -WEIGHT MANAGEMENT 18 MG/3ML ~~LOC~~ SOPN: 0.6000 mg | PEN_INJECTOR | Freq: Every day | SUBCUTANEOUS | 2 refills | Status: DC

## 2017-06-11 NOTE — Telephone Encounter (Signed)
Received PA determination.   PA Approved 06/07/2018- 06/06/2018.  Pharmacy made aware.

## 2017-07-11 ENCOUNTER — Other Ambulatory Visit: Payer: Self-pay | Admitting: Family Medicine

## 2017-08-06 ENCOUNTER — Ambulatory Visit: Payer: BLUE CROSS/BLUE SHIELD | Admitting: Family Medicine

## 2017-08-13 ENCOUNTER — Encounter: Payer: Self-pay | Admitting: Family Medicine

## 2017-08-14 LAB — HM DIABETES EYE EXAM

## 2017-08-30 DIAGNOSIS — N62 Hypertrophy of breast: Secondary | ICD-10-CM | POA: Diagnosis not present

## 2017-08-30 DIAGNOSIS — M546 Pain in thoracic spine: Secondary | ICD-10-CM | POA: Diagnosis not present

## 2017-08-30 DIAGNOSIS — M25519 Pain in unspecified shoulder: Secondary | ICD-10-CM | POA: Diagnosis not present

## 2017-08-30 DIAGNOSIS — M542 Cervicalgia: Secondary | ICD-10-CM | POA: Diagnosis not present

## 2017-08-31 ENCOUNTER — Encounter: Payer: Self-pay | Admitting: Family Medicine

## 2017-08-31 ENCOUNTER — Ambulatory Visit (INDEPENDENT_AMBULATORY_CARE_PROVIDER_SITE_OTHER): Payer: BLUE CROSS/BLUE SHIELD | Admitting: Family Medicine

## 2017-08-31 VITALS — BP 124/70 | HR 50 | Temp 98.4°F | Resp 14 | Ht 67.0 in | Wt 238.4 lb

## 2017-08-31 DIAGNOSIS — N760 Acute vaginitis: Secondary | ICD-10-CM | POA: Diagnosis not present

## 2017-08-31 DIAGNOSIS — Z23 Encounter for immunization: Secondary | ICD-10-CM | POA: Diagnosis not present

## 2017-08-31 DIAGNOSIS — E538 Deficiency of other specified B group vitamins: Secondary | ICD-10-CM | POA: Diagnosis not present

## 2017-08-31 DIAGNOSIS — R7302 Impaired glucose tolerance (oral): Secondary | ICD-10-CM

## 2017-08-31 DIAGNOSIS — I10 Essential (primary) hypertension: Secondary | ICD-10-CM

## 2017-08-31 DIAGNOSIS — B9689 Other specified bacterial agents as the cause of diseases classified elsewhere: Secondary | ICD-10-CM | POA: Diagnosis not present

## 2017-08-31 DIAGNOSIS — D508 Other iron deficiency anemias: Secondary | ICD-10-CM | POA: Diagnosis not present

## 2017-08-31 LAB — WET PREP FOR TRICH, YEAST, CLUE

## 2017-08-31 MED ORDER — METRONIDAZOLE 500 MG PO TABS: 500.0000 mg | ORAL_TABLET | Freq: Three times a day (TID) | ORAL | 0 refills | Status: DC

## 2017-08-31 MED ORDER — FLUCONAZOLE 150 MG PO TABS: 150.0000 mg | ORAL_TABLET | Freq: Once | ORAL | 0 refills | Status: AC

## 2017-08-31 NOTE — Patient Instructions (Signed)
Call me if Blood pressure is staying >140/90 Stop the norvasc (amlodipine)

## 2017-08-31 NOTE — Progress Notes (Signed)
Subjective:    Patient ID: Amy Blair, female    DOB: 1966/06/06, 51 y.o.   MRN: [id number]  Patient presents for Blood Pressure Check (b/p medicine causing dizziness) and Vaginal Discharge   Pt here with vaginal discharge for the past 3 days. She has had some mild itching, same sexual partner, no vaginal bleeding, no abdominal pain or cramping. No UTI symptoms   HTN- taking norvasc and lisinopril HCTZ, has had some dizziness/lightheadedness and not feeling well after taking medications in the morning. She tried to move them to the night and this helped some. She has not checked her BP recently.  No CP no SOB  She has gained 14lbs, admits to some dietary indiscretions, she has also not been taking her B12 and vitamins regulary. S/P bariatric surgery in past on lifelong vitamins    Review Of Systems:  GEN- + fatigue,denies  fever, weight loss,weakness, recent illness HEENT- denies eye drainage, change in vision, nasal discharge, CVS- denies chest pain, palpitations RESP- denies SOB, cough, wheeze ABD- denies N/V, change in stools, abd pain GU- denies dysuria, hematuria, dribbling, incontinence MSK- denies joint pain, muscle aches, injury Neuro- denies headache, +dizziness, syncope, seizure activity       Objective:    BP 124/70   Pulse (!) 50   Temp 98.4 F (36.9 C) (Oral)   Resp 14   Ht  (1.702 m)   Wt 238 lb 6.4 oz (108.1 kg)   SpO2 99%   BMI 37.34 kg/m  GEN- NAD, alert and oriented x3 repeat BP 130/78 HEENT- PERRL, EOMI, non injected sclera, pink conjunctiva, MMM, oropharynx clear Neck- Supple, no thyromegaly CVS- RRR, systolic murmur RESP-CTAB ABD-NABS,soft,NT,ND GU- normal external genitalia, vaginal mucosa pink and moist, cervix visualized no growth, no blood form os, +discharge, no CMT, no ovarian masses, uterus normal size EXT- No edema Pulses- Radial 2+        Assessment & Plan:      Problem List Items Addressed This Visit      Unprioritized   Glucose intolerance (impaired glucose tolerance)   Relevant Orders   Hemoglobin A1c   B12 deficiency   Relevant Orders   Vitamin B12   Iron deficiency anemia   Relevant Orders   Iron, TIBC and Ferritin Panel   Essential hypertension    Will stop the norvasc and continue lisinopril HCTZ No BP meds taken this AM and Bp looks pretty good Have her check at home and call if Bp consistently > 140/90, then would add norvasc back at  and have her take in the evening   With history of bariatric surgery and anemia, recheck labs and b12 levels, she has also gained weight history of glucose intolerance, check A1C. Treat pending results. Reiterated importance of her supplements      Relevant Orders   CBC with Differential/Platelet   Comprehensive metabolic panel    Other Visit Diagnoses    BV (bacterial vaginosis)    -  Primary   Treat with flagyl, given diflucan in case of yeast after antibiotics   Relevant Medications   metroNIDAZOLE (FLAGYL) 500 MG tablet   fluconazole (DIFLUCAN) 150 MG tablet   Other Relevant Orders   WET PREP FOR TRICH, YEAST, CLUE   C. trachomatis/N. gonorrhoeae RNA             Note: This dictation was prepared with Dragon dictation along with smaller [employer]. Any transcriptional errors that result from this process are unintentional.

## 2017-08-31 NOTE — Assessment & Plan Note (Addendum)
Will stop the norvasc and continue lisinopril HCTZ No BP meds taken this AM and Bp looks pretty good Have her check at home and call if Bp consistently > 140/90, then would add norvasc back at  and have her take in the evening   With history of bariatric surgery and anemia, recheck labs and b12 levels, she has also gained weight history of glucose intolerance, check A1C. Treat pending results. Reiterated importance of her supplements

## 2017-08-31 NOTE — Addendum Note (Signed)
Addended by: Phineas Semen A on: 08/31/2017 09:51 AM   Modules accepted: Orders

## 2017-09-01 LAB — IRON,TIBC AND FERRITIN PANEL
%SAT: 15 % (calc) (ref 11–50)
FERRITIN: 25 ng/mL (ref 10–232)
Iron: 56 ug/dL (ref 45–160)
TIBC: 376 ug/dL (ref 250–450)

## 2017-09-01 LAB — COMPREHENSIVE METABOLIC PANEL
AG Ratio: 1.1 (calc) (ref 1.0–2.5)
ALKALINE PHOSPHATASE (APISO): 102 U/L (ref 33–130)
ALT: 17 U/L (ref 6–29)
AST: 20 U/L (ref 10–35)
Albumin: 3.9 g/dL (ref 3.6–5.1)
BUN: 20 mg/dL (ref 7–25)
CHLORIDE: 106 mmol/L (ref 98–110)
CO2: 25 mmol/L (ref 20–32)
CREATININE: 0.76 mg/dL (ref 0.50–1.05)
Calcium: 9 mg/dL (ref 8.6–10.4)
GLOBULIN: 3.5 g/dL (ref 1.9–3.7)
Glucose, Bld: 84 mg/dL (ref 65–99)
Potassium: 4.2 mmol/L (ref 3.5–5.3)
Sodium: 141 mmol/L (ref 135–146)
Total Bilirubin: 0.2 mg/dL (ref 0.2–1.2)
Total Protein: 7.4 g/dL (ref 6.1–8.1)

## 2017-09-01 LAB — CBC WITH DIFFERENTIAL/PLATELET
BASOS PCT: 0 %
Basophils Absolute: 0 cells/uL (ref 0–200)
Eosinophils Absolute: 71 cells/uL (ref 15–500)
Eosinophils Relative: 1.5 %
HCT: 31.1 % — ABNORMAL LOW (ref 35.0–45.0)
Hemoglobin: 10.2 g/dL — ABNORMAL LOW (ref 11.7–15.5)
LYMPHS ABS: 1946 {cells}/uL (ref 850–3900)
MCH: 29.4 pg (ref 27.0–33.0)
MCHC: 32.8 g/dL (ref 32.0–36.0)
MCV: 89.6 fL (ref 80.0–100.0)
MPV: 9.7 fL (ref 7.5–12.5)
Monocytes Relative: 10.8 %
Neutro Abs: 2176 cells/uL (ref 1500–7800)
Neutrophils Relative %: 46.3 %
PLATELETS: 393 10*3/uL (ref 140–400)
RBC: 3.47 10*6/uL — AB (ref 3.80–5.10)
RDW: 13 % (ref 11.0–15.0)
TOTAL LYMPHOCYTE: 41.4 %
WBC: 4.7 10*3/uL (ref 3.8–10.8)
WBCMIX: 508 {cells}/uL (ref 200–950)

## 2017-09-01 LAB — HEMOGLOBIN A1C
EAG (MMOL/L): 6.2 (calc)
HEMOGLOBIN A1C: 5.5 %{Hb} (ref <5.7)
Mean Plasma Glucose: 111 (calc)

## 2017-09-01 LAB — C. TRACHOMATIS/N. GONORRHOEAE RNA
C. trachomatis RNA, TMA: NOT DETECTED
N. GONORRHOEAE RNA, TMA: NOT DETECTED

## 2017-09-01 LAB — VITAMIN B12: Vitamin B-12: 379 pg/mL (ref 200–1100)

## 2017-09-06 ENCOUNTER — Ambulatory Visit (INDEPENDENT_AMBULATORY_CARE_PROVIDER_SITE_OTHER): Payer: BLUE CROSS/BLUE SHIELD | Admitting: Family Medicine

## 2017-09-06 DIAGNOSIS — E538 Deficiency of other specified B group vitamins: Secondary | ICD-10-CM | POA: Diagnosis not present

## 2017-09-06 MED ORDER — CYANOCOBALAMIN 1000 MCG/ML IJ SOLN
1000.0000 ug | INTRAMUSCULAR | Status: AC
  Administered 2017-09-06: 1000 ug via INTRAMUSCULAR

## 2017-09-06 NOTE — Patient Instructions (Signed)
First of three B12 injection given without difficulty.  Pt to return monthly next 2 months.

## 2017-10-12 ENCOUNTER — Ambulatory Visit (INDEPENDENT_AMBULATORY_CARE_PROVIDER_SITE_OTHER): Payer: BLUE CROSS/BLUE SHIELD

## 2017-10-12 DIAGNOSIS — D509 Iron deficiency anemia, unspecified: Secondary | ICD-10-CM

## 2017-10-12 MED ORDER — CYANOCOBALAMIN 1000 MCG/ML IJ SOLN
1000.0000 ug | Freq: Once | INTRAMUSCULAR | Status: AC
  Administered 2017-10-12: 1000 ug via SUBCUTANEOUS

## 2017-10-12 NOTE — Progress Notes (Signed)
Patient was seen in office for b12 injection , Injection was given in right deltoid. Patient tolerated well

## 2017-10-29 ENCOUNTER — Ambulatory Visit: Payer: BLUE CROSS/BLUE SHIELD | Admitting: Family Medicine

## 2017-10-29 ENCOUNTER — Encounter: Payer: Self-pay | Admitting: Family Medicine

## 2017-10-29 ENCOUNTER — Other Ambulatory Visit: Payer: Self-pay

## 2017-10-29 VITALS — BP 128/68 | HR 70 | Temp 97.9°F | Resp 14 | Ht 67.0 in | Wt 237.0 lb

## 2017-10-29 DIAGNOSIS — Z23 Encounter for immunization: Secondary | ICD-10-CM | POA: Diagnosis not present

## 2017-10-29 DIAGNOSIS — Z6837 Body mass index (BMI) 37.0-37.9, adult: Secondary | ICD-10-CM

## 2017-10-29 DIAGNOSIS — I10 Essential (primary) hypertension: Secondary | ICD-10-CM | POA: Diagnosis not present

## 2017-10-29 DIAGNOSIS — Z9884 Bariatric surgery status: Secondary | ICD-10-CM

## 2017-10-29 MED ORDER — LIRAGLUTIDE -WEIGHT MANAGEMENT 18 MG/3ML ~~LOC~~ SOPN: 0.6000 mg | PEN_INJECTOR | Freq: Every day | SUBCUTANEOUS | 2 refills | Status: DC

## 2017-10-29 NOTE — Progress Notes (Signed)
   Subjective:    Patient ID: Amy Blair, female    DOB: [birth year], 51 y.o.   MRN: [id number]  Patient presents for Discuss Weight Loss Medication (refill Saxenda (PA Approved through 06/06/18))  Pt here to f/u weight, she has history of bariatric surgery .She is currently on Saxenda 3mg , startedin Feb weight 242lbs, weight was doen to 224lbs in June, then she had significant stressors when her daughter passed away, she stopped the medication- she gained 14lbs, weight currently 237, down 5lbs from starting weight , she has been back on the Saxenda she started over and now up to 1.8mg  daily   Home weight was 232lbs  [employer] started this past Friday  Using her protein drinks now, she has been skipping meals   DM- diet controlled, A1C at goal   HTN- off norvasc due to low BP symptoms back in Sept, on lisinoril HCTZ  Without difficulty   Review Of Systems:  GEN- denies fatigue, fever, weight loss,weakness, recent illness HEENT- denies eye drainage, change in vision, nasal discharge, CVS- denies chest pain, palpitations RESP- denies SOB, cough, wheeze ABD- denies N/V, change in stools, abd pain GU- denies dysuria, hematuria, dribbling, incontinence MSK- denies joint pain, muscle aches, injury Neuro- denies headache, dizziness, syncope, seizure activity       Objective:    BP 128/68   Pulse 70   Temp 97.9 F (36.6 C) (Oral)   Resp 14   Ht 5\' 7"  (1.702 m)   Wt 237 lb (107.5 kg)   SpO2 96%   BMI 37.12 kg/m  GEN- NAD, alert and oriented x3 HEENT- PERRL, EOMI, non injected sclera, pink conjunctiva, MMM, oropharynx clear CVS- RRR, systolic murmur RESP-CTAB EXT- No edema Pulses- Radial  2+        Assessment & Plan:      Problem List Items Addressed This Visit      Unprioritized   Gastric bypass status for obesity   Relevant Medications   Liraglutide -Weight Management (SAXENDA) 18 MG/3ML SOPN   Obesity    Titrate back up on saxenda to 3mg  She is also back to  working out and journaling her food, like before  I think she can be sucessful if she gets back on track.       Relevant Medications   Liraglutide -Weight Management (SAXENDA) 18 MG/3ML SOPN   Essential hypertension - Primary    Controlled, no changes to medications       Other Visit Diagnoses    Need for prophylactic vaccination against Streptococcus pneumoniae (pneumococcus)       Relevant Orders   Pneumococcal polysaccharide vaccine 23-valent greater than or equal to 2yo subcutaneous/IM (Completed)      Note: This dictation was prepared with Dragon dictation along with smaller phrase technology. Any transcriptional errors that result from this process are unintentional.

## 2017-10-29 NOTE — Assessment & Plan Note (Signed)
>>  ASSESSMENT AND PLAN FOR CLASS 2 OBESITY WRITTEN ON 10/29/2017  7:37 PM BY [place], KAWANTA F  Titrate back up on saxenda to 3mg  She is also back to working out and journaling her food, like before  I think she can be sucessful if she gets back on track.

## 2017-10-29 NOTE — Patient Instructions (Addendum)
Release of records [facility] - last eye visit  Pneumonia vaccine  F/U 4 months PHYSICAL

## 2017-10-29 NOTE — Assessment & Plan Note (Signed)
Titrate back up on saxenda to 3mg  She is also back to working out and journaling her food, like before  I think she can be sucessful if she gets back on track.

## 2017-10-29 NOTE — Assessment & Plan Note (Signed)
Controlled, no changes to medications  

## 2017-11-07 ENCOUNTER — Encounter: Payer: Self-pay | Admitting: Family Medicine

## 2017-11-21 DIAGNOSIS — N62 Hypertrophy of breast: Secondary | ICD-10-CM | POA: Diagnosis not present

## 2017-11-21 DIAGNOSIS — M25519 Pain in unspecified shoulder: Secondary | ICD-10-CM | POA: Diagnosis not present

## 2017-11-21 DIAGNOSIS — M546 Pain in thoracic spine: Secondary | ICD-10-CM | POA: Diagnosis not present

## 2017-11-21 DIAGNOSIS — M542 Cervicalgia: Secondary | ICD-10-CM | POA: Diagnosis not present

## 2017-12-27 ENCOUNTER — Other Ambulatory Visit: Payer: Self-pay | Admitting: Family Medicine

## 2018-02-02 ENCOUNTER — Other Ambulatory Visit: Payer: Self-pay | Admitting: Family Medicine

## 2018-02-26 ENCOUNTER — Ambulatory Visit (INDEPENDENT_AMBULATORY_CARE_PROVIDER_SITE_OTHER): Payer: BLUE CROSS/BLUE SHIELD | Admitting: Family Medicine

## 2018-02-26 ENCOUNTER — Encounter: Payer: Self-pay | Admitting: Family Medicine

## 2018-02-26 ENCOUNTER — Other Ambulatory Visit: Payer: Self-pay

## 2018-02-26 VITALS — BP 122/62 | HR 64 | Temp 97.7°F | Resp 16 | Ht 67.0 in | Wt 243.0 lb

## 2018-02-26 DIAGNOSIS — N898 Other specified noninflammatory disorders of vagina: Secondary | ICD-10-CM | POA: Diagnosis not present

## 2018-02-26 DIAGNOSIS — Z113 Encounter for screening for infections with a predominantly sexual mode of transmission: Secondary | ICD-10-CM | POA: Diagnosis not present

## 2018-02-26 DIAGNOSIS — Z9884 Bariatric surgery status: Secondary | ICD-10-CM | POA: Diagnosis not present

## 2018-02-26 DIAGNOSIS — Z0000 Encounter for general adult medical examination without abnormal findings: Secondary | ICD-10-CM

## 2018-02-26 DIAGNOSIS — Z1159 Encounter for screening for other viral diseases: Secondary | ICD-10-CM | POA: Diagnosis not present

## 2018-02-26 DIAGNOSIS — E538 Deficiency of other specified B group vitamins: Secondary | ICD-10-CM

## 2018-02-26 DIAGNOSIS — D508 Other iron deficiency anemias: Secondary | ICD-10-CM

## 2018-02-26 DIAGNOSIS — R7302 Impaired glucose tolerance (oral): Secondary | ICD-10-CM

## 2018-02-26 DIAGNOSIS — E66812 Obesity, class 2: Secondary | ICD-10-CM

## 2018-02-26 DIAGNOSIS — Z1231 Encounter for screening mammogram for malignant neoplasm of breast: Secondary | ICD-10-CM

## 2018-02-26 DIAGNOSIS — Z124 Encounter for screening for malignant neoplasm of cervix: Secondary | ICD-10-CM

## 2018-02-26 DIAGNOSIS — Z1239 Encounter for other screening for malignant neoplasm of breast: Secondary | ICD-10-CM

## 2018-02-26 DIAGNOSIS — Z6838 Body mass index (BMI) 38.0-38.9, adult: Secondary | ICD-10-CM

## 2018-02-26 DIAGNOSIS — I10 Essential (primary) hypertension: Secondary | ICD-10-CM

## 2018-02-26 DIAGNOSIS — R87610 Atypical squamous cells of undetermined significance on cytologic smear of cervix (ASC-US): Secondary | ICD-10-CM | POA: Diagnosis not present

## 2018-02-26 LAB — WET PREP FOR TRICH, YEAST, CLUE

## 2018-02-26 MED ORDER — SUMATRIPTAN SUCCINATE 100 MG PO TABS: ORAL_TABLET | ORAL | 1 refills | Status: DC

## 2018-02-26 NOTE — Patient Instructions (Signed)
F/U 3 months  I recommend eye visit once a year I recommend dental visit every 6 months Goal is to  Exercise 30 minutes 5 days a week We will call with lab results

## 2018-02-26 NOTE — Progress Notes (Signed)
Subjective:    Patient ID: Amy Blair, female    DOB: 1966-08-13, 52 y.o.   MRN: [id number]  Patient presents for CPE with PAP (is not fasting)   Pt here for CPE  Due for Mammogram- last in  2016, but per patient her plastic surgeon wait a year- so Mammogram due in May  PAP Smear - Due   Colonoscopy- UTD Due 2010    Immunizations-  UTD   Due for Hepatitis C screen    s/p gastric bypass, has history of DM, but resolved with weight loss, last A1C  5.5% un Sept Due for lipids  Needs B12 checked  Continues to gain weight, not using Saxenda regulary    Stuffy nose, cough and congestion started 2 days ago, taking mucinex DM Subjective fever.   Migraines- uses imitrex typically during the spring    Vaginal irritation for a few days    Review Of Systems:  GEN- denies fatigue, fever, weight loss,weakness, recent illness HEENT- denies eye drainage, change in vision, nasal discharge, CVS- denies chest pain, palpitations RESP- denies SOB, cough, wheeze ABD- denies N/V, change in stools, abd pain GU- denies dysuria, hematuria, dribbling, incontinence MSK- denies joint pain, muscle aches, injury Neuro- denies headache, dizziness, syncope, seizure activity       Objective:    BP 122/62   Pulse 64   Temp 97.7 F (36.5 C) (Oral)   Resp 16   Ht 5\' 7"  (1.702 m)   Wt 243 lb (110.2 kg)   SpO2 100%   BMI 38.06 kg/m  GEN- NAD, alert and oriented x3 HEENT- PERRL, EOMI, non injected sclera, pink conjunctiva, MMM, oropharynx clear, nares clear, TM clear no effusion  Neck- Supple, no thyromegaly Breast- normal symmetry, no nipple inversion,no nipple, drainage, no nodules or lumps felt, scar tissue near previous scars Nodes- no axillary nodes CVS- RRR, no murmur RESP-CTAB ABD-NABS,soft,NT,ND GU- normal external genitalia, vaginal mucosa pink and moist, cervix visualized no growth, no blood form os, + discharge, no CMT, no ovarian masses, uterus normal size FOBT- neg, rectum  normal tone, no external lesions  EXT- No edema Pulses- Radial, DP- 2+        Assessment & Plan:      Problem List Items Addressed This Visit      Unprioritized   Glucose intolerance (impaired glucose tolerance)   Relevant Orders   Hemoglobin A1c (Completed)   B12 deficiency   Relevant Orders   Vitamin B12 (Completed)   Iron deficiency anemia   Relevant Orders   Vitamin B12 (Completed)   Iron, TIBC and Ferritin Panel (Completed)   Gastric bypass status for obesity   Relevant Orders   Vitamin B12 (Completed)   Iron, TIBC and Ferritin Panel (Completed)   Obesity   Essential hypertension   Relevant Orders   Lipid panel (Completed)    Other Visit Diagnoses    Screen for STD (sexually transmitted disease)    -  Primary   Relevant Orders   C. trachomatis/N. gonorrhoeae RNA (Completed)   HIV antibody   Routine general medical examination at a health care facility       CPE dont, fasting labs, Mammogram to be done in May, PAP Smear done. STD screen at request . Discussed increasing weight, history of bariatic surgeon, discussed nutrition and proper use of Saxenda to aide in weight loss. She has history of borderline DM with increased weight Recheck A1C with labs     Relevant Orders   Comprehensive  metabolic panel (Completed)   CBC with Differential/Platelet (Completed)   Lipid panel (Completed)   Cervical cancer screening       Relevant Orders   Pap IG w/ reflex to HPV when ASC-U   Breast cancer screening       Relevant Orders   MM SCREENING BREAST TOMO BILATERAL   Vaginal discharge       Relevant Orders   C. trachomatis/N. gonorrhoeae RNA (Completed)   WET PREP FOR TRICH, YEAST, CLUE (Completed)   Need for hepatitis C screening test       Relevant Orders   Hepatitis C antibody      Note: This dictation was prepared with Dragon dictation along with smaller phrase technology. Any transcriptional errors that result from this process are unintentional.

## 2018-02-27 ENCOUNTER — Encounter: Payer: Self-pay | Admitting: Family Medicine

## 2018-02-27 LAB — CBC WITH DIFFERENTIAL/PLATELET
BASOS PCT: 0.2 %
Basophils Absolute: 10 cells/uL (ref 0–200)
EOS PCT: 1.2 %
Eosinophils Absolute: 60 cells/uL (ref 15–500)
HEMATOCRIT: 31.3 % — AB (ref 35.0–45.0)
Hemoglobin: 10.4 g/dL — ABNORMAL LOW (ref 11.7–15.5)
LYMPHS ABS: 2060 {cells}/uL (ref 850–3900)
MCH: 30.1 pg (ref 27.0–33.0)
MCHC: 33.2 g/dL (ref 32.0–36.0)
MCV: 90.7 fL (ref 80.0–100.0)
MPV: 9.7 fL (ref 7.5–12.5)
Monocytes Relative: 10.8 %
NEUTROS PCT: 46.6 %
Neutro Abs: 2330 cells/uL (ref 1500–7800)
PLATELETS: 434 10*3/uL — AB (ref 140–400)
RBC: 3.45 10*6/uL — AB (ref 3.80–5.10)
RDW: 12.7 % (ref 11.0–15.0)
Total Lymphocyte: 41.2 %
WBC: 5 10*3/uL (ref 3.8–10.8)
WBCMIX: 540 {cells}/uL (ref 200–950)

## 2018-02-27 LAB — HEPATITIS C ANTIBODY
HEP C AB: NONREACTIVE
SIGNAL TO CUT-OFF: 0.03 (ref <1.00)

## 2018-02-27 LAB — COMPREHENSIVE METABOLIC PANEL
AG Ratio: 1 (calc) (ref 1.0–2.5)
ALKALINE PHOSPHATASE (APISO): 109 U/L (ref 33–130)
ALT: 17 U/L (ref 6–29)
AST: 23 U/L (ref 10–35)
Albumin: 3.9 g/dL (ref 3.6–5.1)
BILIRUBIN TOTAL: 0.2 mg/dL (ref 0.2–1.2)
BUN: 24 mg/dL (ref 7–25)
CALCIUM: 8.9 mg/dL (ref 8.6–10.4)
CO2: 25 mmol/L (ref 20–32)
CREATININE: 0.78 mg/dL (ref 0.50–1.05)
Chloride: 106 mmol/L (ref 98–110)
Globulin: 3.8 g/dL (calc) — ABNORMAL HIGH (ref 1.9–3.7)
Glucose, Bld: 71 mg/dL (ref 65–99)
Potassium: 4 mmol/L (ref 3.5–5.3)
Sodium: 140 mmol/L (ref 135–146)
Total Protein: 7.7 g/dL (ref 6.1–8.1)

## 2018-02-27 LAB — IRON,TIBC AND FERRITIN PANEL
%SAT: 13 % (calc) (ref 11–50)
Ferritin: 29 ng/mL (ref 10–232)
IRON: 53 ug/dL (ref 45–160)
TIBC: 393 mcg/dL (calc) (ref 250–450)

## 2018-02-27 LAB — HEMOGLOBIN A1C
EAG (MMOL/L): 6.5 (calc)
Hgb A1c MFr Bld: 5.7 % of total Hgb — ABNORMAL HIGH (ref <5.7)
MEAN PLASMA GLUCOSE: 117 (calc)

## 2018-02-27 LAB — LIPID PANEL
Cholesterol: 128 mg/dL (ref <200)
HDL: 57 mg/dL (ref >50)
LDL Cholesterol (Calc): 56 mg/dL (calc)
NON-HDL CHOLESTEROL (CALC): 71 mg/dL (ref <130)
TRIGLYCERIDES: 66 mg/dL (ref <150)
Total CHOL/HDL Ratio: 2.2 (calc) (ref <5.0)

## 2018-02-27 LAB — VITAMIN B12: VITAMIN B 12: 1294 pg/mL — AB (ref 200–1100)

## 2018-02-27 LAB — C. TRACHOMATIS/N. GONORRHOEAE RNA
C. TRACHOMATIS RNA, TMA: NOT DETECTED
N. GONORRHOEAE RNA, TMA: NOT DETECTED

## 2018-02-27 LAB — HIV ANTIBODY (ROUTINE TESTING W REFLEX): HIV: NONREACTIVE

## 2018-02-27 NOTE — Assessment & Plan Note (Signed)
B/P controlled 

## 2018-02-27 NOTE — Assessment & Plan Note (Signed)
>>  ASSESSMENT AND PLAN FOR CLASS 2 OBESITY WRITTEN ON 02/27/2018 10:01 AM BY [place], KAWANTA F  Recheck weight in 3  Months, should be able to lose at least 10lb during that time

## 2018-02-27 NOTE — Assessment & Plan Note (Signed)
Recheck weight in 3  Months, should be able to lose at least 10lb during that time

## 2018-02-28 ENCOUNTER — Other Ambulatory Visit: Payer: Self-pay | Admitting: *Deleted

## 2018-02-28 MED ORDER — METRONIDAZOLE 500 MG PO TABS: 500.0000 mg | ORAL_TABLET | Freq: Two times a day (BID) | ORAL | 0 refills | Status: DC

## 2018-03-02 LAB — PAP IG W/ RFLX HPV ASCU

## 2018-03-02 LAB — HUMAN PAPILLOMAVIRUS, HIGH RISK: HPV DNA HIGH RISK: DETECTED — AB

## 2018-03-04 ENCOUNTER — Other Ambulatory Visit: Payer: Self-pay | Admitting: *Deleted

## 2018-03-04 DIAGNOSIS — R87810 Cervical high risk human papillomavirus (HPV) DNA test positive: Principal | ICD-10-CM

## 2018-03-04 DIAGNOSIS — R87610 Atypical squamous cells of undetermined significance on cytologic smear of cervix (ASC-US): Secondary | ICD-10-CM

## 2018-03-11 ENCOUNTER — Other Ambulatory Visit: Payer: Self-pay | Admitting: Family Medicine

## 2018-03-11 DIAGNOSIS — Z1231 Encounter for screening mammogram for malignant neoplasm of breast: Secondary | ICD-10-CM

## 2018-03-18 DIAGNOSIS — R87610 Atypical squamous cells of undetermined significance on cytologic smear of cervix (ASC-US): Secondary | ICD-10-CM

## 2018-03-18 HISTORY — DX: Atypical squamous cells of undetermined significance on cytologic smear of cervix (ASC-US): R87.610

## 2018-03-19 ENCOUNTER — Encounter: Payer: Self-pay | Admitting: Gynecology

## 2018-03-19 ENCOUNTER — Ambulatory Visit: Payer: BLUE CROSS/BLUE SHIELD | Admitting: Gynecology

## 2018-03-19 VITALS — BP 124/76 | Ht 66.0 in | Wt 243.0 lb

## 2018-03-19 DIAGNOSIS — R87810 Cervical high risk human papillomavirus (HPV) DNA test positive: Secondary | ICD-10-CM

## 2018-03-19 DIAGNOSIS — R87610 Atypical squamous cells of undetermined significance on cytologic smear of cervix (ASC-US): Secondary | ICD-10-CM | POA: Diagnosis not present

## 2018-03-19 NOTE — Patient Instructions (Signed)
Office will call you with biopsy results 

## 2018-03-19 NOTE — Progress Notes (Signed)
Amy Blair 1966/03/21 [id number]        52 y.o.  G2P0010 new patient who presents having recently had her annual exam through her primary physician's office to include GYN exam where her Pap smear showed ASCUS with positive high risk HPV.  Patient has no history of abnormal Pap smears in the past and has no new partners.  She is without gynecologic complaints having no bleeding or significant menopausal symptoms.  She is overdue for her mammogram and I reminded her to schedule this.  Past medical history,surgical history, problem list, medications, allergies, family history and social history were all reviewed and documented in the EPIC chart.  Directed ROS with pertinent positives and negatives documented in the history of present illness/assessment and plan.  Exam: Kennon Portela assistant Vitals:   03/19/18 1212  BP: 124/76  Weight: 243 lb (110.2 kg)  Height: 5\' 6"  (1.676 m)   General appearance:  Normal Abdomen soft nontender without masses guarding rebound Pelvic external BUS vagina normal.  Cervix normal.  Uterus grossly normal midline mobile.  Adnexa without gross masses or tenderness  Colposcopy performed after acetic acid cleanse was inadequate without transformation zone seen 360 degrees.  No abnormalities were seen.  ECC was performed.  Patient tolerated well. Physical Exam  Genitourinary:       Assessment/Plan:  52 y.o. G2P0010 with first abnormal Pap smear showing ASCUS with positive high risk HPV.  The patient and I reviewed the transition from normal through ASCUS, LGSIL, HGSIL and cervical cancer.  We discussed the association with the positive high risk HPV.  I also addressed the question as to when she was exposed.  She has no new partners and the issues of remote exposure years ago with persistence of the HPV as a possibility was discussed versus new exposure.  Issues of high-grade/low-grade progression and regression of her dysplasia was also discussed.  I saw  no abnormalities on colposcopy although note that the transformation zone was not visualized 360 degrees consistent with her peri/postmenopausal status.  ECC was performed and she will follow-up for these results.  If negative then plan expectant management with recommended Pap smear/HPV in 1 year.  If otherwise abnormal then will triaged based upon results.    Dara Lords MD, 12:56 PM 03/19/2018

## 2018-03-22 ENCOUNTER — Encounter: Payer: Self-pay | Admitting: Gynecology

## 2018-03-22 DIAGNOSIS — E669 Obesity, unspecified: Secondary | ICD-10-CM | POA: Diagnosis not present

## 2018-03-22 LAB — PATHOLOGY

## 2018-03-22 LAB — TISSUE SPECIMEN

## 2018-04-09 DIAGNOSIS — E669 Obesity, unspecified: Secondary | ICD-10-CM | POA: Diagnosis not present

## 2018-04-24 ENCOUNTER — Ambulatory Visit
Admission: RE | Admit: 2018-04-24 | Discharge: 2018-04-24 | Disposition: A | Discharge status: Home / Self Care | Payer: BLUE CROSS/BLUE SHIELD | Source: Ambulatory Visit | Attending: Family Medicine | Admitting: Family Medicine

## 2018-04-24 DIAGNOSIS — Z1231 Encounter for screening mammogram for malignant neoplasm of breast: Secondary | ICD-10-CM | POA: Diagnosis not present

## 2018-04-25 ENCOUNTER — Ambulatory Visit: Payer: Self-pay

## 2018-05-17 DIAGNOSIS — E669 Obesity, unspecified: Secondary | ICD-10-CM | POA: Diagnosis not present

## 2018-05-27 ENCOUNTER — Telehealth: Payer: Self-pay | Admitting: *Deleted

## 2018-05-27 NOTE — Telephone Encounter (Signed)
Received request from pharmacy for re-certification on PA for Saxenda.   PA submitted.   Dx: E66.01- obesity.   Patient can't take Belviq D/T concurrent treatment of depression/ anxiety.  Patient can't take Qsymia D/T concurrent treatment of HTN.

## 2018-05-27 NOTE — Telephone Encounter (Signed)
Your information has been submitted to [employer] [place]. [employer] [place] will review the request and fax you a determination directly, typically within 3 business days of your submission once all necessary information is received.  If [employer] [place] has not responded in 3 business days or if you have any questions about your submission, contact [employer] [place] at [phone]. 

## 2018-05-31 ENCOUNTER — Encounter: Payer: Self-pay | Admitting: Family Medicine

## 2018-05-31 ENCOUNTER — Ambulatory Visit: Payer: BLUE CROSS/BLUE SHIELD | Admitting: Family Medicine

## 2018-05-31 ENCOUNTER — Other Ambulatory Visit: Payer: Self-pay

## 2018-05-31 VITALS — BP 126/68 | HR 82 | Temp 98.4°F | Resp 14 | Ht 66.0 in | Wt 233.0 lb

## 2018-05-31 DIAGNOSIS — Z6837 Body mass index (BMI) 37.0-37.9, adult: Secondary | ICD-10-CM | POA: Diagnosis not present

## 2018-05-31 DIAGNOSIS — Z9884 Bariatric surgery status: Secondary | ICD-10-CM | POA: Diagnosis not present

## 2018-05-31 DIAGNOSIS — R8290 Unspecified abnormal findings in urine: Secondary | ICD-10-CM | POA: Diagnosis not present

## 2018-05-31 DIAGNOSIS — I10 Essential (primary) hypertension: Secondary | ICD-10-CM | POA: Diagnosis not present

## 2018-05-31 LAB — URINALYSIS, ROUTINE W REFLEX MICROSCOPIC
Bilirubin Urine: NEGATIVE
Glucose, UA: NEGATIVE
HGB URINE DIPSTICK: NEGATIVE
KETONES UR: NEGATIVE
Leukocytes, UA: NEGATIVE
NITRITE: NEGATIVE
PH: 6 (ref 5.0–8.0)
Protein, ur: NEGATIVE
Specific Gravity, Urine: 1.01 (ref 1.001–1.03)

## 2018-05-31 NOTE — Telephone Encounter (Signed)
[facility]ed [facility].   Advised that more current weight is needed for PA.   Patient seen in office on 05/31/2018. Current weight given.

## 2018-05-31 NOTE — Patient Instructions (Signed)
F/U 3 months- MBD or Leisa for weight

## 2018-05-31 NOTE — Progress Notes (Signed)
   Subjective:    Patient ID: Amy Blair, female    DOB: 09-Dec-1966, 52 y.o.   MRN: [id number]  Patient presents for Follow-up (is not fasting)   Pt here to f/u chronic medical problems   Still exercising regulary , weight down 10lbs since March  Still taking protein shakes, meals mostly veggies and protein  Taking Saxenda without any difficulty    Taking supplements as prescribed    Has had odor to urine, has been trying to use drink more water, no dysuria       Had f/u with GYN, normal Colopo, repeat PAP in 1 year    HTN- taking BP meds as prescribed no side effects    Review Of Systems:  GEN- denies fatigue, fever, weight loss,weakness, recent illness HEENT- denies eye drainage, change in vision, nasal discharge, CVS- denies chest pain, palpitations RESP- denies SOB, cough, wheeze ABD- denies N/V, change in stools, abd pain GU- denies dysuria, hematuria, dribbling, incontinence MSK- denies joint pain, muscle aches, injury Neuro- denies headache, dizziness, syncope, seizure activity       Objective:    BP 126/68   Pulse 82   Temp 98.4 F (36.9 C) (Oral)   Resp 14   Ht 5\' 6"  (1.676 m)   Wt 233 lb (105.7 kg)   LMP 02/20/2012   SpO2 98%   BMI 37.61 kg/m  GEN- NAD, alert and oriented x3 HEENT- PERRL, EOMI, non injected sclera, pink conjunctiva, MMM, oropharynx clear CVS- RRR, no murmur RESP-CTAB ABD-NABS,soft,NT,ND, no CVA tenderness  EXT- No edema Pulses- Radial, DP- 2+        Assessment & Plan:      Problem List Items Addressed This Visit      Unprioritized   Essential hypertension    Well controlled no changes       Gastric bypass status for obesity    S/p bypass but still obese, doing well with saxenda and adhering to diet now       Obesity    F/U in a few months to document weight on Saxenda Will send in approval for medication to continue        Other Visit Diagnoses    Malodorous urine    -  Primary   UA neg, increast  water, no actual UTI symptoms   Relevant Orders   Urinalysis, Routine w reflex microscopic (Completed)      Note: This dictation was prepared with Dragon dictation along with smaller phrase technology. Any transcriptional errors that result from this process are unintentional.

## 2018-06-03 ENCOUNTER — Encounter: Payer: Self-pay | Admitting: Family Medicine

## 2018-06-03 ENCOUNTER — Ambulatory Visit: Payer: BLUE CROSS/BLUE SHIELD | Admitting: Family Medicine

## 2018-06-03 MED ORDER — LIRAGLUTIDE -WEIGHT MANAGEMENT 18 MG/3ML ~~LOC~~ SOPN: 0.6000 mg | PEN_INJECTOR | Freq: Every day | SUBCUTANEOUS | 2 refills | Status: DC

## 2018-06-03 NOTE — Assessment & Plan Note (Signed)
F/U in a few months to document weight on Saxenda Will send in approval for medication to continue

## 2018-06-03 NOTE — Assessment & Plan Note (Signed)
>>  ASSESSMENT AND PLAN FOR CLASS 2 OBESITY WRITTEN ON 06/03/2018  8:41 AM BY Milinda Antis F  F/U in a few months to document weight on Saxenda Will send in approval for medication to continue

## 2018-06-03 NOTE — Assessment & Plan Note (Signed)
Well controlled no changes 

## 2018-06-03 NOTE — Telephone Encounter (Signed)
Received PA determination.   PA [id number] approved 05/27/2018- 05/26/2019.  Pharmacy made aware.

## 2018-06-03 NOTE — Assessment & Plan Note (Signed)
S/p bypass but still obese, doing well with saxenda and adhering to diet now

## 2018-07-01 DIAGNOSIS — E669 Obesity, unspecified: Secondary | ICD-10-CM | POA: Diagnosis not present

## 2018-07-29 DIAGNOSIS — Z6841 Body Mass Index (BMI) 40.0 and over, adult: Secondary | ICD-10-CM | POA: Diagnosis not present

## 2018-07-29 DIAGNOSIS — E669 Obesity, unspecified: Secondary | ICD-10-CM | POA: Diagnosis not present

## 2018-08-09 ENCOUNTER — Encounter: Payer: Self-pay | Admitting: Family Medicine

## 2018-08-09 ENCOUNTER — Ambulatory Visit (INDEPENDENT_AMBULATORY_CARE_PROVIDER_SITE_OTHER): Payer: BLUE CROSS/BLUE SHIELD | Admitting: Family Medicine

## 2018-08-09 VITALS — BP 110/76 | HR 75 | Temp 98.0°F | Resp 16 | Ht 66.0 in | Wt 232.4 lb

## 2018-08-09 DIAGNOSIS — N390 Urinary tract infection, site not specified: Secondary | ICD-10-CM

## 2018-08-09 DIAGNOSIS — N898 Other specified noninflammatory disorders of vagina: Secondary | ICD-10-CM

## 2018-08-09 LAB — URINALYSIS, ROUTINE W REFLEX MICROSCOPIC
Bilirubin Urine: NEGATIVE
GLUCOSE, UA: NEGATIVE
Hgb urine dipstick: NEGATIVE
Ketones, ur: NEGATIVE
Nitrite: POSITIVE — AB
PROTEIN: NEGATIVE
RBC / HPF: NONE SEEN /HPF (ref 0–2)
Specific Gravity, Urine: 1.02 (ref 1.001–1.03)
pH: 5.5 (ref 5.0–8.0)

## 2018-08-09 LAB — WET PREP FOR TRICH, YEAST, CLUE

## 2018-08-09 LAB — MICROSCOPIC MESSAGE

## 2018-08-09 MED ORDER — CEPHALEXIN 500 MG PO CAPS: 500.0000 mg | ORAL_CAPSULE | Freq: Four times a day (QID) | ORAL | 0 refills | Status: DC

## 2018-08-09 MED ORDER — FLUCONAZOLE 150 MG PO TABS: ORAL_TABLET | ORAL | 0 refills | Status: DC

## 2018-08-09 NOTE — Progress Notes (Signed)
   Subjective:    Patient ID: Amy Blair, female    DOB: 08/24/1966, 52 y.o.   MRN: [id number]  Patient presents for Urinary Tract Infection (Patient has urinary frequency. Patient would like a refill on phernergan.)    Has had urinary frequency wose at night, has odor to urine. Has been trying to flush more and drink cranberry Has some mild white discharge, no bleeding, had a little itching    SHe is planning to start hospice therapy for her daughters death last year Also taking care of her mother and siblings are not helping She feels it is time to work on her grief       Review Of Systems:  GEN- denies fatigue, fever, weight loss,weakness, recent illness HEENT- denies eye drainage, change in vision, nasal discharge, CVS- denies chest pain, palpitations RESP- denies SOB, cough, wheeze ABD- denies N/V, change in stools, abd pain GU- denies dysuria, hematuria, dribbling, incontinence MSK- denies joint pain, muscle aches, injury Neuro- denies headache, dizziness, syncope, seizure activity       Objective:    BP 110/76   Pulse 75   Temp 98 F (36.7 C) (Oral)   Resp 16   Ht 5\' 6"  (1.676 m)   Wt 232 lb 6 oz (105.4 kg)   LMP 02/20/2012   SpO2 97%   BMI 37.51 kg/m  GEN- NAD, alert and oriented x3 HEENT- PERRL, EOMI, non injected sclera, pink conjunctiva, MMM, oropharynx clear CVS- RRR, soft systolic  murmur RESP-CTAB ABD-NABS,soft,NT,ND, no CVA tenderness GU- normal external genitalia, vaginal mucosa pink and moist, cervix visualized no growth, no blood form os, + thick white  discharge, no CMT, no ovarian masses, uterus normal size EXT- No edema Psych- normal affect and mood  Pulses- Radial 2+        Assessment & Plan:      Problem List Items Addressed This Visit    None    Visit Diagnoses    Urinary tract infection without hematuria, site unspecified    -  Primary    Urine culture sent, treat Keflex QID, diflucan for yeast   Relevant Medications   cephALEXin (KEFLEX) 500 MG capsule   fluconazole (DIFLUCAN) 150 MG tablet   Other Relevant Orders   Urinalysis, Routine w reflex microscopic   Urine Culture   Vaginal discharge       Relevant Orders   WET PREP FOR TRICH, YEAST, CLUE   C. trachomatis/N. gonorrhoeae RNA      Note: This dictation was prepared with Dragon dictation along with smaller Lobbyistphrase technology. Any transcriptional errors that result from this process are unintentional.

## 2018-08-09 NOTE — Patient Instructions (Signed)
We will call with results Start antibiotics Take the yeast medication F/U as needed

## 2018-08-10 LAB — C. TRACHOMATIS/N. GONORRHOEAE RNA
C. trachomatis RNA, TMA: NOT DETECTED
N. GONORRHOEAE RNA, TMA: NOT DETECTED

## 2018-08-11 LAB — URINE CULTURE
MICRO NUMBER: 91009444
SPECIMEN QUALITY:: ADEQUATE

## 2018-08-13 ENCOUNTER — Other Ambulatory Visit: Payer: Self-pay | Admitting: *Deleted

## 2018-08-13 ENCOUNTER — Other Ambulatory Visit: Payer: Self-pay | Admitting: Family Medicine

## 2018-08-13 MED ORDER — PROMETHAZINE HCL 25 MG PO TABS: 25.0000 mg | ORAL_TABLET | Freq: Three times a day (TID) | ORAL | 2 refills | Status: AC | PRN

## 2018-08-13 MED ORDER — METRONIDAZOLE 500 MG PO TABS: 500.0000 mg | ORAL_TABLET | Freq: Two times a day (BID) | ORAL | 0 refills | Status: DC

## 2018-08-13 MED ORDER — FLUCONAZOLE 150 MG PO TABS: ORAL_TABLET | ORAL | 0 refills | Status: DC

## 2018-08-30 ENCOUNTER — Ambulatory Visit: Payer: BLUE CROSS/BLUE SHIELD | Admitting: Family Medicine

## 2018-09-10 ENCOUNTER — Encounter: Payer: Self-pay | Admitting: Family Medicine

## 2018-09-17 ENCOUNTER — Encounter: Payer: Self-pay | Admitting: Family Medicine

## 2018-09-17 ENCOUNTER — Ambulatory Visit (INDEPENDENT_AMBULATORY_CARE_PROVIDER_SITE_OTHER): Payer: BLUE CROSS/BLUE SHIELD | Admitting: Family Medicine

## 2018-09-17 ENCOUNTER — Other Ambulatory Visit: Payer: Self-pay

## 2018-09-17 VITALS — BP 98/64 | HR 78 | Temp 98.2°F | Resp 16 | Ht 66.0 in | Wt 237.0 lb

## 2018-09-17 DIAGNOSIS — Z23 Encounter for immunization: Secondary | ICD-10-CM | POA: Diagnosis not present

## 2018-09-17 DIAGNOSIS — I10 Essential (primary) hypertension: Secondary | ICD-10-CM | POA: Diagnosis not present

## 2018-09-17 DIAGNOSIS — D508 Other iron deficiency anemias: Secondary | ICD-10-CM | POA: Diagnosis not present

## 2018-09-17 DIAGNOSIS — E538 Deficiency of other specified B group vitamins: Secondary | ICD-10-CM | POA: Diagnosis not present

## 2018-09-17 DIAGNOSIS — Z111 Encounter for screening for respiratory tuberculosis: Secondary | ICD-10-CM | POA: Diagnosis not present

## 2018-09-17 DIAGNOSIS — Z6838 Body mass index (BMI) 38.0-38.9, adult: Secondary | ICD-10-CM

## 2018-09-17 MED ORDER — LIRAGLUTIDE -WEIGHT MANAGEMENT 18 MG/3ML ~~LOC~~ SOPN: 0.6000 mg | PEN_INJECTOR | Freq: Every day | SUBCUTANEOUS | 2 refills | Status: DC

## 2018-09-17 MED ORDER — LISINOPRIL-HYDROCHLOROTHIAZIDE 20-25 MG PO TABS: 1.0000 | ORAL_TABLET | Freq: Every day | ORAL | 2 refills | Status: DC

## 2018-09-17 NOTE — Progress Notes (Signed)
Patient ID: Amy Blair, female    DOB: 1966/01/12, 53 y.o.   MRN: [id number]  PCP: Salley Scarlet, MD  Chief Complaint  Patient presents with  . 3 month F/U    is fasting  . TB Gold    needs TB test    Subjective:   Amy Blair is a 52 y.o. female, presents to clinic with CC of routine follow-up for obesity, hypertension, B12 deficiency, and efficiency anemia secondary to gastric bypass surgery.  Patient requests needs TB test - needs for job  For obesity - has been taking phentermine (July - Sept), but she feels like its no longer working, wants to try and go back on saxenda, prior authorization is valid until June 2020 She reports that she did not have much success with phentermine and felt like she was always craving sweets   Wt Readings from Last 5 Encounters:  09/17/18 237 lb (107.5 kg)  08/09/18 232 lb 6 oz (105.4 kg)  05/31/18 233 lb (105.7 kg)  03/19/18 243 lb (110.2 kg)  02/26/18 243 lb (110.2 kg)  Has lost some weight, but is not yet at her goal which is 190-180  Exercising - is having some trouble with exercising with the heat, walking more 45-60 min, 4x a week. Still taking protein shakes, meals mostly veggies and protein  When she took Saxenda in the past she had no difficulty, no side effects    History of gastric bypass, B12 deficiency, iron deficiency anemia, she reports she is taking supplements as prescribed - b12, multivitamin, iron is due for repeat labs she denies any symptoms of anemia such as fatigue, rapid heart rate, cold intolerance, shortness of breath or chest pain.  Constipation management doing well with linzess -she requests samples today, the meds were very expensive  HTN- taking BP meds as prescribed no side effects  She has watched BP, no highs or lows, denies any side effects or symptoms.  Denies chest pain, shortness of breath, chest pressure, lower extremity edema, palpitations, near-syncope, orthopnea, PND, headaches or  visual disturbances    Patient Active Problem List   Diagnosis Date Noted  . Gastric bypass status for obesity 05/29/2016  . 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 B12 deficiency 07/30/2015  . Bradycardia 06/07/2015  . Migraine headache 04/07/2015  . 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 Allergic rhinitis 04/30/2009  . Glucose intolerance (impaired glucose tolerance) 02/04/2008  . Obesity 08/10/2007  . Iron deficiency anemia 08/10/2007  . Essential hypertension 08/10/2007     Prior to Admission medications   Medication Sig Start Date End Date Taking? Authorizing Provider  cetirizine (ZYRTEC) 10 MG tablet Take 1 tablet (10 mg total) by mouth daily. 06/07/15  Yes [place], Velna Hatchet, MD  Cholecalciferol (VITAMIN D) 2000 UNITS CAPS Take by mouth.   Yes [provider]  cyclobenzaprine (FLEXERIL) 10 MG tablet Take 1 tablet (10 mg total) by mouth 3 (three) times daily as needed for muscle spasms. 01/22/17  Yes [place], Velna Hatchet, MD  ferrous sulfate 325 (65 FE) MG tablet Take 325 mg by mouth daily with breakfast.   Yes [provider]  hydrOXYzine (ATARAX/VISTARIL) 10 MG tablet Take 1 tablet (10 mg total) by mouth 3 (three) times daily as needed. 10/08/15  Yes [place], Velna Hatchet, MD  linaclotide [facility]) 145 MCG CAPS capsule Take 1 capsule (145 mcg total) by mouth daily. 01/22/17  Yes , Velna Hatchet, MD  lisinopril-hydrochlorothiazide (PRINZIDE,ZESTORETIC) 20-25 MG tablet TAKE 1 TABLET  BY MOUTH DAILY 08/13/18  Yes [place], Velna Hatchet, MD  Multiple Vitamin (MULTIVITAMIN WITH MINERALS) TABS tablet Take 1 tablet by mouth daily.   Yes [provider]  promethazine (PHENERGAN) 25 MG tablet Take 1 tablet (25 mg total) by mouth every 8 (eight) hours as needed for nausea or vomiting. 08/13/18  Yes [place], Velna Hatchet, MD  SUMAtriptan (IMITREX) 100 MG tablet Take 1 tablet at onset, then repeat in 2 hours 02/26/18  Yes [place], Velna Hatchet, MD  valACYclovir (VALTREX) 500 MG tablet TAKE 1  TABLET (500 MG TOTAL) BY MOUTH DAILY. 02/04/18  Yes [place], Velna Hatchet, MD  vitamin B-12 (CYANOCOBALAMIN) 100 MCG tablet Take 100 mcg by mouth 3 (three) times a week.   Yes [provider]     Allergies  Allergen Reactions  . Sulfa Antibiotics Hives     Family History  Problem Relation Age of Onset  . Arthritis Mother   . Cancer Mother        Cervix  . Diabetes Mother   . Hyperlipidemia Mother   . Hypertension Mother   . Diabetes Father   . Heart disease Father   . Cancer Sister        cervical  . Diabetes Sister   . Hypertension Sister   . Diabetes Sister   . Hypertension Sister   . Anesthesia problems Neg Hx      Social History   Socioeconomic History  . Marital status: Single    Spouse Blair: Not on file  . Number of children: Not on file  . Years of education: Not on file  . Highest education level: Not on file  Occupational History  . Not on file  Social Needs  . Financial resource strain: Not on file  . Food insecurity:    Worry: Not on file    Inability: Not on file  . Transportation needs:    Medical: Not on file    Non-medical: Not on file  Tobacco Use  . Smoking status: Never Smoker  . Smokeless tobacco: Never Used  Substance and Sexual Activity  . Alcohol use: Yes    Alcohol/week: 0.0 standard drinks    Comment: OCCASIONALLY   . Drug use: No  . Sexual activity: Yes    Comment: 1st intercourse 18 yo-5 partners  Lifestyle  . Physical activity:    Days per week: Not on file    Minutes per session: Not on file  . Stress: Not on file  Relationships  . Social connections:    Talks on phone: Not on file    Gets together: Not on file    Attends religious service: Not on file    Active member of club or organization: Not on file    Attends meetings of clubs or organizations: Not on file    Relationship status: Not on file  . Intimate partner violence:    Fear of current or ex partner: Not on file    Emotionally abused: Not on file     Physically abused: Not on file    Forced sexual activity: Not on file  Other Topics Concern  . Not on file  Social History Narrative   Lives with daughter in a one story home.  Works with special needs adults.  Education: associates degree.     Review of Systems  Constitutional: Negative.  Negative for activity change, appetite change, fatigue and unexpected weight change.  HENT: Negative.   Eyes: Negative.   Respiratory: Negative.  Negative for shortness of breath.   Cardiovascular: Negative.  Negative for chest pain, palpitations and leg swelling.  Gastrointestinal: Negative.  Negative for abdominal pain and blood in stool.  Endocrine: Negative.   Genitourinary: Negative.   Musculoskeletal: Negative.  Negative for arthralgias, gait problem, joint swelling and myalgias.  Skin: Negative.  Negative for color change, pallor and rash.  Allergic/Immunologic: Negative.   Neurological: Negative.  Negative for syncope and weakness.  Hematological: Negative.   Psychiatric/Behavioral: Negative.  Negative for confusion, dysphoric mood, self-injury and suicidal ideas. The patient is not nervous/anxious.   All other systems reviewed and are negative.      Objective:    Vitals:   09/17/18 0928  BP: 98/64  Pulse: 78  Resp: 16  Temp: 98.2 F (36.8 C)  TempSrc: Oral  SpO2: 98%  Weight: 237 lb (107.5 kg)  Height: 5\' 6"  (1.676 m)      Physical Exam  Constitutional: She is oriented to person, place, and time. She appears well-developed and well-nourished.  Non-toxic appearance. No distress.  HENT:  Head: Normocephalic and atraumatic.  Right Ear: External ear normal.  Left Ear: External ear normal.  Nose: Nose normal.  Mouth/Throat: Uvula is midline, oropharynx is clear and moist and mucous membranes are normal. No oropharyngeal exudate.  Eyes: Pupils are equal, round, and reactive to light. Conjunctivae, EOM and lids are normal. No scleral icterus.  Neck: Normal range of motion and  phonation normal. Neck supple. No tracheal deviation present.  Cardiovascular: Normal rate, regular rhythm, normal heart sounds and normal pulses. Exam reveals no gallop and no friction rub.  No murmur heard. Pulses:      Radial pulses are 2+ on the right side, and 2+ on the left side.       Posterior tibial pulses are 2+ on the right side, and 2+ on the left side.  Pulmonary/Chest: Effort normal and breath sounds normal. No stridor. No respiratory distress. She has no wheezes. She has no rhonchi. She has no rales. She exhibits no tenderness.  Abdominal: Soft. Normal appearance and bowel sounds are normal. She exhibits no distension. There is no tenderness. There is no rebound and no guarding.  Musculoskeletal: Normal range of motion. She exhibits no edema or deformity.  Lymphadenopathy:    She has no cervical adenopathy.  Neurological: She is alert and oriented to person, place, and time. She exhibits normal muscle tone. Coordination and gait normal.  Skin: Skin is warm, dry and intact. Capillary refill takes less than 2 seconds. No rash noted. She is not diaphoretic. No pallor.  Psychiatric: She has a normal mood and affect. Her speech is normal and behavior is normal.  Nursing note and vitals reviewed.         Assessment & Plan:   Patient is a 52 year old female, here for routine follow-up of multiple conditions, also requesting screening lab work for tuberculosis for her job.    ICD-10-CM   1. Screening for tuberculosis Z11.1 QuantiFERON-TB Gold Plus  2. Essential hypertension I10 COMPLETE METABOLIC PANEL WITH GFR    Lipid panel  3. B12 deficiency E53.8 CBC with Differential/Platelet    Vitamin B12  4. Other iron deficiency anemia D50.8 CBC with Differential/Platelet    Vitamin B12  5. Class 2 severe obesity due to excess calories with serious comorbidity and body mass index (BMI) of 38.0 to 38.9 in adult (HCC) E66.01 Amb ref to Medical Nutrition Therapy-MNT   Z68.38 COMPLETE  METABOLIC PANEL WITH GFR  Lipid panel  6. Need for tetanus, diphtheria, and acellular pertussis (Tdap) vaccine in patient of adolescent age or older Z23 Tdap vaccine greater than or equal to 7yo IM  7. Need for immunization against influenza Z23 Flu Vaccine QUAD 36+ mos IM    Blood pressure soft today but asymptomatic, continue current medications, monitor, contact us if low or symptomatic and can decrease med dose, recheck a B12 deficiency and iron deficiency anemia, asymptomatic.  Still working on weight loss with increased exercise, diet and Saxenda, she is willing to obtain her Tdap at her and flu vaccines today   Amy Berry, PA-C 09/17/18 9:42 AM

## 2018-09-18 ENCOUNTER — Other Ambulatory Visit: Payer: BLUE CROSS/BLUE SHIELD

## 2018-09-18 DIAGNOSIS — E538 Deficiency of other specified B group vitamins: Secondary | ICD-10-CM

## 2018-09-18 DIAGNOSIS — Z6838 Body mass index (BMI) 38.0-38.9, adult: Secondary | ICD-10-CM | POA: Diagnosis not present

## 2018-09-18 DIAGNOSIS — D508 Other iron deficiency anemias: Secondary | ICD-10-CM

## 2018-09-18 DIAGNOSIS — I10 Essential (primary) hypertension: Secondary | ICD-10-CM | POA: Diagnosis not present

## 2018-09-19 LAB — COMPLETE METABOLIC PANEL WITH GFR
AG RATIO: 1.1 (calc) (ref 1.0–2.5)
ALT: 16 U/L (ref 6–29)
AST: 19 U/L (ref 10–35)
Albumin: 4 g/dL (ref 3.6–5.1)
Alkaline phosphatase (APISO): 103 U/L (ref 33–130)
BILIRUBIN TOTAL: 0.3 mg/dL (ref 0.2–1.2)
BUN: 24 mg/dL (ref 7–25)
CALCIUM: 9.1 mg/dL (ref 8.6–10.4)
CHLORIDE: 106 mmol/L (ref 98–110)
CO2: 25 mmol/L (ref 20–32)
CREATININE: 0.91 mg/dL (ref 0.50–1.05)
GFR, Est African American: 83 mL/min/{1.73_m2} (ref >=60)
GFR, Est Non African American: 72 mL/min/{1.73_m2} (ref >=60)
Globulin: 3.8 g/dL (calc) — ABNORMAL HIGH (ref 1.9–3.7)
Glucose, Bld: 81 mg/dL (ref 65–99)
POTASSIUM: 4.5 mmol/L (ref 3.5–5.3)
SODIUM: 140 mmol/L (ref 135–146)
Total Protein: 7.8 g/dL (ref 6.1–8.1)

## 2018-09-19 LAB — CBC WITH DIFFERENTIAL/PLATELET
BASOS PCT: 0.2 %
Basophils Absolute: 11 cells/uL (ref 0–200)
EOS PCT: 1.2 %
Eosinophils Absolute: 68 cells/uL (ref 15–500)
HCT: 31.4 % — ABNORMAL LOW (ref 35.0–45.0)
Hemoglobin: 10.2 g/dL — ABNORMAL LOW (ref 11.7–15.5)
Lymphs Abs: 2155 cells/uL (ref 850–3900)
MCH: 29.7 pg (ref 27.0–33.0)
MCHC: 32.5 g/dL (ref 32.0–36.0)
MCV: 91.5 fL (ref 80.0–100.0)
MONOS PCT: 12.1 %
MPV: 9.8 fL (ref 7.5–12.5)
NEUTROS PCT: 48.7 %
Neutro Abs: 2776 cells/uL (ref 1500–7800)
PLATELETS: 430 10*3/uL — AB (ref 140–400)
RBC: 3.43 10*6/uL — ABNORMAL LOW (ref 3.80–5.10)
RDW: 13.3 % (ref 11.0–15.0)
TOTAL LYMPHOCYTE: 37.8 %
WBC mixed population: 690 cells/uL (ref 200–950)
WBC: 5.7 10*3/uL (ref 3.8–10.8)

## 2018-09-19 LAB — LIPID PANEL
CHOL/HDL RATIO: 2.6 (calc) (ref <5.0)
Cholesterol: 141 mg/dL (ref <200)
HDL: 55 mg/dL (ref >50)
LDL Cholesterol (Calc): 73 mg/dL (calc)
NON-HDL CHOLESTEROL (CALC): 86 mg/dL (ref <130)
TRIGLYCERIDES: 54 mg/dL (ref <150)

## 2018-09-19 LAB — VITAMIN B12: VITAMIN B 12: 589 pg/mL (ref 200–1100)

## 2018-09-20 ENCOUNTER — Other Ambulatory Visit: Payer: Self-pay | Admitting: Family Medicine

## 2018-09-20 DIAGNOSIS — Z111 Encounter for screening for respiratory tuberculosis: Secondary | ICD-10-CM

## 2018-10-08 ENCOUNTER — Ambulatory Visit (HOSPITAL_COMMUNITY)
Admission: EM | Admit: 2018-10-08 | Discharge: 2018-10-08 | Discharge status: Absent without leave | Payer: BLUE CROSS/BLUE SHIELD

## 2018-10-08 ENCOUNTER — Ambulatory Visit (HOSPITAL_COMMUNITY)
Admission: EM | Admit: 2018-10-08 | Discharge: 2018-10-08 | Disposition: A | Discharge status: Home / Self Care | Payer: BLUE CROSS/BLUE SHIELD | Attending: Family Medicine | Admitting: Family Medicine

## 2018-10-08 ENCOUNTER — Other Ambulatory Visit: Payer: Self-pay

## 2018-10-08 ENCOUNTER — Ambulatory Visit (INDEPENDENT_AMBULATORY_CARE_PROVIDER_SITE_OTHER): Payer: BLUE CROSS/BLUE SHIELD

## 2018-10-08 ENCOUNTER — Encounter (HOSPITAL_COMMUNITY): Payer: Self-pay | Admitting: *Deleted

## 2018-10-08 DIAGNOSIS — S4992XA Unspecified injury of left shoulder and upper arm, initial encounter: Secondary | ICD-10-CM | POA: Diagnosis not present

## 2018-10-08 DIAGNOSIS — M25561 Pain in right knee: Secondary | ICD-10-CM

## 2018-10-08 DIAGNOSIS — W19XXXA Unspecified fall, initial encounter: Secondary | ICD-10-CM

## 2018-10-08 DIAGNOSIS — M25512 Pain in left shoulder: Secondary | ICD-10-CM

## 2018-10-08 MED ORDER — MELOXICAM 7.5 MG PO TABS: 7.5000 mg | ORAL_TABLET | Freq: Every day | ORAL | 0 refills | Status: DC

## 2018-10-08 NOTE — ED Provider Notes (Signed)
[facility]    CSN: [id number] Arrival date & time: 10/08/18  1354     History   Chief Complaint Chief Complaint  Patient presents with  . Fall    HPI Amy Blair is a 52 y.o. female.   52 year old female comes in for left shoulder pain and right knee pain after fall earlier today. States she tripped and fell forward, landed on her right knee, but then fell to the side and hit her left shoulder to the ground. She states right knee is sore, but able to ambulate and bear weight without difficulty. Left shoulder pain with movement and decreased ROM. Denies radiation of pain, numbness/tingling. Denies head injury/loss of consciousness. Has not taken anything for the symptoms.      Past Medical History:  Diagnosis Date  . Anemia    HX  . Arthritis    knees-a little  . ASCUS with positive high risk HPV cervical 03/2018   Colposcopy inadequate but negative.  ECC showed LGSIL.  . Diabetes mellitus without complication (HCC)   . Heart murmur   . 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 Hypertension     Patient Active Problem List   Diagnosis Date Noted  . Gastric bypass status for obesity 05/29/2016  . 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 B12 deficiency 07/30/2015  . Bradycardia 06/07/2015  . Migraine headache 04/07/2015  . 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 Allergic rhinitis 04/30/2009  . Glucose intolerance (impaired glucose tolerance) 02/04/2008  . Obesity 08/10/2007  . Iron deficiency anemia 08/10/2007  . Essential hypertension 08/10/2007    Past Surgical History:  Procedure Laterality Date  . BREAST SURGERY     Reduction  . CESAREAN SECTION    . COSMETIC SURGERY    . GASTRIC BYPASS  06/2009  . LIPOSUCTION  04/05/2012   Procedure: LIPOSUCTION;  Surgeon: Louisa Second, MD;  Location: [facility] OR;  Service: Plastics;  Laterality: N/A;  . PANNICULECTOMY  04/05/2012   Procedure: PANNICULECTOMY;  Surgeon: Louisa Second, MD;   Location: [facility] OR;  Service: Plastics;  Laterality: N/A;  . panniculectomy  hernia repair  04/05/2012  . REDUCTION MAMMAPLASTY Bilateral 04/28/2017  . Torn quad reapair  07/2008   Right  . UMBILICAL HERNIA REPAIR  04/05/2012   Procedure: HERNIA REPAIR UMBILICAL ADULT;  Surgeon: Louisa Second, MD;  Location: [facility] OR;  Service: Plastics;  Laterality: N/A;    OB History    Gravida  2   Para  1   Term      Preterm      AB  1   Living  0     SAB  1   TAB      Ectopic      Multiple      Live Births               Home Medications    Prior to Admission medications   Medication Sig Start Date End Date Taking? Authorizing Provider  cetirizine (ZYRTEC) 10 MG tablet Take 1 tablet (10 mg total) by mouth daily. 06/07/15   Salley Scarlet, MD  Cholecalciferol (VITAMIN D) 2000 UNITS CAPS Take by mouth.    [provider]  cyclobenzaprine (FLEXERIL) 10 MG tablet Take 1 tablet (10 mg total) by mouth 3 (three) times daily as needed for muscle spasms. 01/22/17   Salley Scarlet, MD  ferrous sulfate 325 (65 FE) MG tablet Take 325 mg by  mouth daily with breakfast.    [provider]  hydrOXYzine (ATARAX/VISTARIL) 10 MG tablet Take 1 tablet (10 mg total) by mouth 3 (three) times daily as needed. 10/08/15   [place], Velna Hatchet, MD  linaclotide [facility]) 145 MCG CAPS capsule Take 1 capsule (145 mcg total) by mouth daily. 01/22/17   Salley Scarlet, MD  Liraglutide -Weight Management (SAXENDA) 18 MG/3ML SOPN Inject 0.6 mg into the skin daily. Titrate by 0.6mg  each week, until 3mg  maximum 09/17/18   Danelle Berry, PA-C  lisinopril-hydrochlorothiazide (PRINZIDE,ZESTORETIC) 20-25 MG tablet Take 1 tablet by mouth daily. 09/17/18   Danelle Berry, PA-C  meloxicam (MOBIC) 7.5 MG tablet Take 1 tablet (7.5 mg total) by mouth daily. 10/08/18   Cathie Hoops, Albaro Deviney V, PA-C  Multiple Vitamin (MULTIVITAMIN WITH MINERALS) TABS tablet Take 1 tablet by mouth daily.    [provider]  promethazine  (PHENERGAN) 25 MG tablet Take 1 tablet (25 mg total) by mouth every 8 (eight) hours as needed for nausea or vomiting. 08/13/18   Salley Scarlet, MD  SUMAtriptan (IMITREX) 100 MG tablet Take 1 tablet at onset, then repeat in 2 hours 02/26/18   Salley Scarlet, MD  valACYclovir (VALTREX) 500 MG tablet TAKE 1 TABLET (500 MG TOTAL) BY MOUTH DAILY. 02/04/18   [place], Velna Hatchet, MD  vitamin B-12 (CYANOCOBALAMIN) 100 MCG tablet Take 100 mcg by mouth 3 (three) times a week.    [provider]    Family History Family History  Problem Relation Age of Onset  . Arthritis Mother   . Cancer Mother        Cervix  . Diabetes Mother   . Hyperlipidemia Mother   . Hypertension Mother   . Diabetes Father   . Heart disease Father   . Cancer Sister        cervical  . Diabetes Sister   . Hypertension Sister   . Diabetes Sister   . Hypertension Sister   . Anesthesia problems Neg Hx     Social History Social History   Tobacco Use  . Smoking status: Never Smoker  . Smokeless tobacco: Never Used  Substance Use Topics  . Alcohol use: Yes    Alcohol/week: 0.0 standard drinks    Comment: OCCASIONALLY   . Drug use: No     Allergies   Sulfa antibiotics   Review of Systems Review of Systems  Reason unable to perform ROS: See HPI as above.     Physical Exam Triage Vital Signs ED Triage Vitals  Enc Vitals Group     BP 10/08/18 1403 (!) 161/101     Pulse Rate 10/08/18 1403 68     Resp 10/08/18 1403 18     Temp 10/08/18 1403 98.3 F (36.8 C)     Temp Source 10/08/18 1403 Oral     SpO2 10/08/18 1403 100 %     Weight --      Height --      Head Circumference --      Peak Flow --      Pain Score 10/08/18 1404 9     Pain Loc --      Pain Edu? --      Excl. in GC? --    No data found.  Updated Vital Signs BP (!) 161/101 (BP Location: Right Arm)   Pulse 68   Temp 98.3 F (36.8 C) (Oral)   Resp 18   LMP 02/20/2012   SpO2 100%   Physical Exam  Constitutional: She is  oriented to person, place, and time. She appears well-developed and well-nourished. No distress.  HENT:  Head: Normocephalic and atraumatic.  Eyes: Pupils are equal, round, and reactive to light. Conjunctivae are normal.  Neck: Normal range of motion. Neck supple.  Pulmonary/Chest: Effort normal. No respiratory distress.  Musculoskeletal:  No swelling, contusion seen of the left shoulder. No tenderness to palpation of shoulder and clavicle. Decreased ROM of the left shoulder, anterior shoulder with pain during ROM. Sensation intact. Radial pulse 2+, cap refill <2s  No swelling, contusion seen of right knee. Old well healed surgical scar to the right knee. Full ROM, strength normal and equal bilaterally. Sensation intact and equal bilaterally.   Neurological: She is alert and oriented to person, place, and time.  Skin: She is not diaphoretic.     UC Treatments / Results  Labs (all labs ordered are listed, but only abnormal results are displayed) Labs Reviewed - No data to display  EKG None  Radiology Dg Shoulder Left  Result Date: 10/08/2018 CLINICAL DATA:  Larey Seat today landing on the left shoulder with pain EXAM: LEFT SHOULDER - 2+ VIEW COMPARISON:  None. FINDINGS: The left humeral head is in normal position and the glenohumeral joint space is normal. There is narrowing of the subacromial joint space by slightly downward sloping acromion and acromial spurring which may result in impingement and possibly chronic rotator cuff disease. The left [facility] joint is normally aligned. The scapula appears intact. IMPRESSION: 1. No acute fracture. 2. Findings consistent with impingement and possibly chronic rotator cuff disease. Electronically Signed   By: Dwyane Dee M.D.   On: 10/08/2018 15:04    Procedures Procedures (including critical care time)  Medications Ordered in UC Medications - No data to display  Initial Impression / Assessment and Plan / UC Course  I have reviewed the triage vital  signs and the nursing notes.  Pertinent labs & imaging results that were available during my care of the patient were reviewed by me and considered in my medical decision making (see chart for details).    Discussed xray results with patient. NSAIDs, ice compress, rest for now. If continues to have decreased ROM, to follow up with orthopedics for further evaluation needed.  Final Clinical Impressions(s) / UC Diagnoses   Final diagnoses:  Acute pain of left shoulder  Acute pain of right knee  Fall, initial encounter    ED Prescriptions    Medication Sig Dispense Auth. Provider   meloxicam (MOBIC) 7.5 MG tablet Take 1 tablet (7.5 mg total) by mouth daily. 15 tablet Threasa Alpha, [place] 10/08/18 [id number]

## 2018-10-08 NOTE — ED Triage Notes (Signed)
States she fell this am her foot got caught on the carpet and she fell c/o pain in right knee and left shoulder.

## 2018-10-08 NOTE — Discharge Instructions (Signed)
Start mobic as directed. Take with food. Do not take ibuprofen (motrin/advil)/ naproxen (aleve) while on mobic. Ice compress to the area. If still having decreased range of motion of left shoulder, follow up with orthopedics for further evaluation needed.

## 2018-10-08 NOTE — ED Notes (Signed)
Bed: UC01 Expected date:  Expected time:  Means of arrival:  Comments: 

## 2018-10-25 ENCOUNTER — Encounter: Payer: BLUE CROSS/BLUE SHIELD | Attending: Family Medicine | Admitting: Dietician

## 2018-10-25 DIAGNOSIS — Z713 Dietary counseling and surveillance: Secondary | ICD-10-CM | POA: Insufficient documentation

## 2018-10-25 DIAGNOSIS — Z6838 Body mass index (BMI) 38.0-38.9, adult: Secondary | ICD-10-CM | POA: Insufficient documentation

## 2018-10-29 ENCOUNTER — Encounter: Payer: BLUE CROSS/BLUE SHIELD | Admitting: Registered"

## 2018-10-29 ENCOUNTER — Encounter: Payer: Self-pay | Admitting: Registered"

## 2018-10-29 DIAGNOSIS — Z713 Dietary counseling and surveillance: Secondary | ICD-10-CM | POA: Diagnosis not present

## 2018-10-29 DIAGNOSIS — E669 Obesity, unspecified: Secondary | ICD-10-CM

## 2018-10-29 DIAGNOSIS — Z6838 Body mass index (BMI) 38.0-38.9, adult: Secondary | ICD-10-CM | POA: Diagnosis not present

## 2018-10-29 NOTE — Progress Notes (Signed)
  Medical Nutrition Therapy:  Appt start time: 8:10 end time:  9:05.  Name is pronounced "Amy Blair" Assessment:  Primary concerns today: wants to get back on track.   Pt expectations: encouragement with getting back on track   Pt states she had RYGB in 2010. Pt states she was 389 lbs and lost down to 190 lbs. Pt states her current weight is about 235 lbs. Pt states she is really stressed with work, with helping take care of her mom, [place] to [place], and she also has some family members currently staying with her. Pt states she wants to get down to 185 lbs. Pt states she knows what to do but does not do it. 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   Pt states she has a habit of buying cookout tray from [employer] but does not eat the entire meal. Pt states will order burger, nuggets and corn dog but does not eat the burger. Pt states she loves cookies. 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 Pt states she can can go all day without drinking anything. Pt states she does not eat sometime due to working. 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   Pt states she has time in her day to workout but needs to make the time.    Preferred Learning Style:   No preference indicated   Learning Readiness:   Contemplating  Ready  Change in progress   MEDICATIONS: See list   DIETARY INTAKE:  Usual eating pattern includes 1-3 meals and 2 snacks per day.  Everyday foods include cookies, protein shakes, salads, grilled protein.  Avoided foods include none stated.    24-hr recall:  B ( AM): sometimes skips or protein shake or 2 oz steak Snk ( AM): none  L ( PM): 1/2 c baked spaghetti or tossed salad or ham/turkey sandwich or [employer]-double burger + corn dog + nuggets Snk ( PM): cookie D ( PM): sometimes skips or grilled chicken + broccoli + pinto beans  Snk ( PM): none Beverages: sweet tea, water, beer (once on Sundays), lactose-free  milk   Usual physical activity: ADLs  Estimated energy needs: 1600-1800 calories 180-200 g carbohydrates 120-135 g protein 44-50 g fat  Progress Towards Goal(s):  In progress.   Nutritional Diagnosis:  Minier-3.3 Overweight/obesity related to past poor dietary habits and physical inactivity as evidenced by patient w/ recent RYGB surgery following dietary guidelines for continued weight loss.     Intervention: Nutrition education and counseling. Pt was educated and counseled on appropriate bariatric multivitamin and calcium supplements, mindful eating, and the importance of eating at least 3 meals a day. Pt was educated on eating to nourish her body. Pt was in agreement with goals listed.  Goals: - Get bariatric multivitamin and calcium supplements.  - Take bariatric multivitamin and suipplemets at least 2 hours away from on e anther.  - Aim to eat at least 3 meals a day. Balance meals with protein + non-starchy vegetables + starchy vegetables - Download baritastic app.  Teaching Method Utilized:  Visual Auditory Hands on  Handouts given during visit include:  Baritastic App  Phase VII: Maintenance Phase  Bariatric Multivitamin and Supplement Recommendations  Barriers to learning/adherence to lifestyle change: work-life balance  Demonstrated degree of understanding via:  Teach Back   Monitoring/Evaluation:  Dietary intake, exercise, and body weight in 1 month(s).

## 2018-10-29 NOTE — Patient Instructions (Addendum)
-   Get bariatric multivitamin and calcium supplements.   - Take bariatric multivitamin and suipplemets at least 2 hours away from on e anther.   - Aim to eat at least 3 meals a day. Balance meals with protein + non-starchy vegetables + starchy vegetables  - Download baritastic app.

## 2018-11-26 ENCOUNTER — Encounter: Payer: BLUE CROSS/BLUE SHIELD | Attending: Family Medicine | Admitting: Registered"

## 2018-11-26 ENCOUNTER — Encounter: Payer: Self-pay | Admitting: Family Medicine

## 2018-11-26 ENCOUNTER — Other Ambulatory Visit: Payer: Self-pay

## 2018-11-26 ENCOUNTER — Encounter: Payer: Self-pay | Admitting: Registered"

## 2018-11-26 ENCOUNTER — Ambulatory Visit: Payer: BLUE CROSS/BLUE SHIELD | Admitting: Family Medicine

## 2018-11-26 VITALS — BP 110/68 | HR 76 | Temp 98.2°F | Resp 14 | Ht 66.0 in | Wt 246.0 lb

## 2018-11-26 DIAGNOSIS — S46912D Strain of unspecified muscle, fascia and tendon at shoulder and upper arm level, left arm, subsequent encounter: Secondary | ICD-10-CM

## 2018-11-26 DIAGNOSIS — Z6838 Body mass index (BMI) 38.0-38.9, adult: Secondary | ICD-10-CM | POA: Insufficient documentation

## 2018-11-26 DIAGNOSIS — J40 Bronchitis, not specified as acute or chronic: Secondary | ICD-10-CM

## 2018-11-26 DIAGNOSIS — M19019 Primary osteoarthritis, unspecified shoulder: Secondary | ICD-10-CM

## 2018-11-26 DIAGNOSIS — I10 Essential (primary) hypertension: Secondary | ICD-10-CM

## 2018-11-26 DIAGNOSIS — Z713 Dietary counseling and surveillance: Secondary | ICD-10-CM | POA: Insufficient documentation

## 2018-11-26 DIAGNOSIS — Z6839 Body mass index (BMI) 39.0-39.9, adult: Secondary | ICD-10-CM

## 2018-11-26 DIAGNOSIS — E669 Obesity, unspecified: Secondary | ICD-10-CM

## 2018-11-26 MED ORDER — HYDROCOD POLST-CPM POLST ER 10-8 MG/5ML PO SUER: 5.0000 mL | Freq: Two times a day (BID) | ORAL | 0 refills | Status: DC | PRN

## 2018-11-26 NOTE — Assessment & Plan Note (Signed)
Continue Saxenda , following with dietitian.  Restart her proper nutrition in the setting of her bariatric weight loss surgery for.  We will follow-up in 3 months will have labs and recheck her weight.

## 2018-11-26 NOTE — Assessment & Plan Note (Signed)
>>  ASSESSMENT AND PLAN FOR CLASS 2 OBESITY WRITTEN ON 11/26/2018  2:54 PM BY Milinda Antis F  Continue Saxenda , following with dietitian.  Restart her proper nutrition in the setting of her bariatric weight loss surgery for.  We will follow-up in 3 months will have labs and recheck her weight.

## 2018-11-26 NOTE — Patient Instructions (Addendum)
-   Get bariatric multivitamin and calcium supplements.   - Take bariatric multivitamin and suipplemets at least 2 hours away from one another.   - Aim to work out at least 3 days/week at least 60 min.   - Add vegetables with dinner.   - Great job with increasing fluid intake!

## 2018-11-26 NOTE — Progress Notes (Signed)
  Medical Nutrition Therapy:  Appt start time: 8:10 end time:  8:45.  Name is pronounced "Cheryl" Assessment:  Primary concerns today: wants to get back on track.   Pt expectations: encouragement with getting back on track   Pt states she had RYGB in 2010. Pt states she was 389 lbs and lost down to 190 lbs. Pt states her current weight is about 235 lbs.   Pt states she has a foster daughter living with her. Pt states she celebrated daughter's birthday at beach with family. Pt states while she was at the beach, she consumed 3 meals a day and had protein snacks between meals. Pt states she also had some m&m's and the box lasted the entire week. Pt states she hates the gym; will go to track or treadmill. Pt states she plans to work out with cousin 3 days/week beginning tomorrow. Pt reports changing from sweet tea to unsweetened tea and herbal tea. Pt states she is a member of local [employer].   Pt states she is really stressed with work, with helping take care of her mom, transporting her to [place], and she also has some family members currently staying with her. 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 Pt states she wants to get down to 185 lbs. Pt states she knows what to do but does not do it. 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    Preferred Learning Style:   No preference indicated   Learning Readiness:   Contemplating  Ready  Change in progress   MEDICATIONS: See list   DIETARY INTAKE:  Usual eating pattern includes 1-3 meals and 2 snacks per day.  Everyday foods include cookies, protein shakes, salads, grilled protein.  Avoided foods include none stated.    24-hr recall:  B ( AM): 2 sausage patties + toast  Snk ( AM): cheese sticks   L ( PM): turkey/ham roll-ups or ham sandwich Snk ( PM): nuts or m&m's D ( PM): 4 flat wings + pizza or chicken breast   Snk ( PM): none Beverages:  unsweetened tea, herbal tea, water (~84 oz), beer (once on Sundays), lactose-free milk   Usual physical activity: ADLs  Estimated energy needs: 1600-1800 calories 180-200 g carbohydrates 120-135 g protein 44-50 g fat  Progress Towards Goal(s):  In progress.   Nutritional Diagnosis:  -3.3 Overweight/obesity related to past poor dietary habits and physical inactivity as evidenced by patient w/ recent RYGB surgery following dietary guidelines for continued weight loss.     Intervention: Nutrition education and counseling. Pt was educated and counseled on appropriate bariatric multivitamin and calcium supplements, mindful eating, and the importance of eating at least 3 meals a day. Pt was educated on eating to nourish her body. Pt was in agreement with goals listed.  Goals: - Get bariatric multivitamin and calcium supplements.  - Take bariatric multivitamin and suipplemets at least 2 hours away from one another.  - Aim to work out at least 3 days/week at least 60 min.  - Add vegetables with dinner.  - Great job with increasing fluid intake!  Teaching Method Utilized:  Visual Auditory Hands on  Handouts given during visit include:  Bariatric Multivitamin and Supplement Recommendations  Barriers to learning/adherence to lifestyle change: work-life balance  Demonstrated degree of understanding via:  Teach Back   Monitoring/Evaluation:  Dietary intake, exercise, and body weight in 1 month(s).

## 2018-11-26 NOTE — Progress Notes (Signed)
Subjective:    Patient ID: Amy Blair, female    DOB: 08/05/1966, 52 y.o.   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 She was just walking and tripped on a lifted piece of carpet, she was carrying a trash can and she dropped it and fell onto left shoulder. Seen at the ER  Same day on 10/22, had xray   IMPRESSION: 1. No acute fracture. 2. Findings consistent with impingement and possibly chronic rotator cuff disease.  She continues to have intermittent pain, especially when she reaches behind   she took 1 dose of meloxicam  She has throbbing at bedtime , arm gets stiff, on occasion has to use right arm to hoist her arm up.      Cough- She went to beach for her birthday 2 weeks ago, slept with sliding door open, has had cough since then. She has been taking halls cough drops. Cough worse at night, cough has mild production, no fever, no sinus drainage   Obesity- her weight is up 13lbs since August, she has not been exercising regulary  she saw her dietician this morning, working on more protein and better meal planning, she has been skipping some meals     Review Of Systems:  GEN- denies fatigue, fever, weight loss,weakness, recent illness HEENT- denies eye drainage, change in vision, nasal discharge, CVS- denies chest pain, palpitations RESP- denies SOB, +cough, wheeze ABD- denies N/V, change in stools, abd pain GU- denies dysuria, hematuria, dribbling, incontinence MSK- +joint pain, muscle aches, injury Neuro- denies headache, dizziness, syncope, seizure activity       Objective:    BP 110/68   Pulse 76   Temp 98.2 F (36.8 C) (Oral)   Resp 14   Ht 5\' 6"  (1.676 m)   Wt 246 lb (111.6 kg)   LMP 02/20/2012   SpO2 97%   BMI 39.71 kg/m  GEN- NAD, alert and  oriented x3 HEENT- PERRL, EOMI, non injected sclera, pink conjunctiva, MMM, oropharynx clear, nares clear, no sinus tenderness, TM clear bilat no effusion Neck- Supple, no LAD  CVS- RRR, no murmur RESP-CTAB MSK- normal inspection shoulder/arm, rotator cuff grossly in tact bilat, +empty can on left side, biceps in tact, neg neer's bilat  EXT- No edema Pulses- Radial 2+        Assessment & Plan:      Problem List Items Addressed This Visit      Unprioritized   Essential hypertension    Well controlled no changes       Obesity    Continue Saxenda , following with dietitian.  Restart her proper nutrition in the setting of her bariatric weight loss surgery for.  We will follow-up in 3 months will have labs and recheck her weight.       Other Visit Diagnoses    Strain of left shoulder, subsequent encounter    -  Primary   TOpical anti-inflammatory , home exercises, hydrocodone in cough syrup, will give it another few weeks if not orthopedic referral   AC joint arthropathy       AC joint spur, wants to hold on ortho, reasonable at this time, per above   Bronchitis       Residual cough from a bronchitis, normal sat,  normal exam, tussionex, continue allery medication      Note: This dictation was prepared with Dragon dictation along with smaller phrase technology. Any transcriptional errors that result from this process are unintentional.

## 2018-11-26 NOTE — Assessment & Plan Note (Signed)
Well controlled no changes 

## 2018-11-26 NOTE — Patient Instructions (Signed)
Take the cough medicine which helps cough and shoulder pain F/U 3 months

## 2018-12-25 ENCOUNTER — Ambulatory Visit: Payer: BLUE CROSS/BLUE SHIELD | Admitting: Registered"

## 2019-01-20 ENCOUNTER — Ambulatory Visit: Payer: BLUE CROSS/BLUE SHIELD | Admitting: Registered"

## 2019-01-21 ENCOUNTER — Other Ambulatory Visit: Payer: Self-pay | Admitting: Family Medicine

## 2019-02-25 ENCOUNTER — Encounter: Payer: Self-pay | Admitting: Family Medicine

## 2019-02-25 ENCOUNTER — Ambulatory Visit: Payer: BLUE CROSS/BLUE SHIELD | Admitting: Family Medicine

## 2019-02-25 ENCOUNTER — Other Ambulatory Visit: Payer: Self-pay

## 2019-02-25 VITALS — BP 112/64 | HR 50 | Temp 98.3°F | Resp 14 | Ht 66.0 in | Wt 229.0 lb

## 2019-02-25 DIAGNOSIS — D508 Other iron deficiency anemias: Secondary | ICD-10-CM

## 2019-02-25 DIAGNOSIS — I10 Essential (primary) hypertension: Secondary | ICD-10-CM | POA: Diagnosis not present

## 2019-02-25 DIAGNOSIS — Z9884 Bariatric surgery status: Secondary | ICD-10-CM

## 2019-02-25 LAB — COMPREHENSIVE METABOLIC PANEL
AG Ratio: 1.1 (calc) (ref 1.0–2.5)
ALT: 21 U/L (ref 6–29)
AST: 24 U/L (ref 10–35)
Albumin: 3.9 g/dL (ref 3.6–5.1)
Alkaline phosphatase (APISO): 89 U/L (ref 37–153)
BILIRUBIN TOTAL: 0.3 mg/dL (ref 0.2–1.2)
BUN/Creatinine Ratio: 31 (calc) — ABNORMAL HIGH (ref 6–22)
BUN: 26 mg/dL — AB (ref 7–25)
CO2: 27 mmol/L (ref 20–32)
Calcium: 9.6 mg/dL (ref 8.6–10.4)
Chloride: 104 mmol/L (ref 98–110)
Creat: 0.83 mg/dL (ref 0.50–1.05)
GLUCOSE: 80 mg/dL (ref 65–99)
Globulin: 3.7 g/dL (calc) (ref 1.9–3.7)
Potassium: 5.4 mmol/L — ABNORMAL HIGH (ref 3.5–5.3)
SODIUM: 141 mmol/L (ref 135–146)
TOTAL PROTEIN: 7.6 g/dL (ref 6.1–8.1)

## 2019-02-25 LAB — CBC WITH DIFFERENTIAL/PLATELET
Absolute Monocytes: 547 cells/uL (ref 200–950)
BASOS ABS: 0 {cells}/uL (ref 0–200)
Basophils Relative: 0 %
EOS ABS: 29 {cells}/uL (ref 15–500)
Eosinophils Relative: 0.6 %
HEMATOCRIT: 31.2 % — AB (ref 35.0–45.0)
Hemoglobin: 10 g/dL — ABNORMAL LOW (ref 11.7–15.5)
Lymphs Abs: 2165 cells/uL (ref 850–3900)
MCH: 29 pg (ref 27.0–33.0)
MCHC: 32.1 g/dL (ref 32.0–36.0)
MCV: 90.4 fL (ref 80.0–100.0)
MONOS PCT: 11.4 %
MPV: 10 fL (ref 7.5–12.5)
NEUTROS PCT: 42.9 %
Neutro Abs: 2059 cells/uL (ref 1500–7800)
Platelets: 419 10*3/uL — ABNORMAL HIGH (ref 140–400)
RBC: 3.45 10*6/uL — ABNORMAL LOW (ref 3.80–5.10)
RDW: 13.3 % (ref 11.0–15.0)
Total Lymphocyte: 45.1 %
WBC: 4.8 10*3/uL (ref 3.8–10.8)

## 2019-02-25 LAB — VITAMIN B12: VITAMIN B 12: 819 pg/mL (ref 200–1100)

## 2019-02-25 LAB — IRON: IRON: 49 ug/dL (ref 45–160)

## 2019-02-25 NOTE — Assessment & Plan Note (Signed)
Seems to work with dietitian and her online coaching group.  She is getting her protein in and losing weight.  She is also taking her vitamins and supplements.  I will check her iron level to her B12 level.  She will continue the Saxenda as an adjunct

## 2019-02-25 NOTE — Patient Instructions (Addendum)
F/U 3 months for physical  

## 2019-02-25 NOTE — Progress Notes (Signed)
   Subjective:    Patient ID: Amy Blair, female    DOB: 06-20-1966, 53 y.o.   MRN: [id number]  Patient presents for Follow-up (is not fasting)  Here to follow-up chronic medical problems.  Her last visit was in December A history of bariatric surgery in the past.  She has been trying to work on her weight.  Has cut out bread sweets, candy. Her last visit in December weight was 246 pounds she is now down to 229 pounds but she is getting her protein in.  Is also been following back up with her nutritionist. She also follows with a onling coaching- [employer] shakes -Also on the Saxenda - She is drinking 120 ounces of water daily   Hypertension she is taking her blood pressure medicine as prescribed  Fasting labs in October showed a normal cholesterol panel  Chronic anemia her last hemoglobin was 10.2 which is her baseline  Constipation- using lizness, but bowels improved with dietary changes    Feels good. No new concerns     Review Of Systems:  GEN- denies fatigue, fever, weight loss,weakness, recent illness HEENT- denies eye drainage, change in vision, nasal discharge, CVS- denies chest pain, palpitations RESP- denies SOB, cough, wheeze ABD- denies N/V, change in stools, abd pain GU- denies dysuria, hematuria, dribbling, incontinence MSK- denies joint pain, muscle aches, injury Neuro- denies headache, dizziness, syncope, seizure activity       Objective:    BP 112/64   Pulse (!) 50   Temp 98.3 F (36.8 C) (Oral)   Resp 14   Ht 5\' 6"  (1.676 m)   Wt 229 lb (103.9 kg)   LMP 02/20/2012   SpO2 97%   BMI 36.96 kg/m  GEN- NAD, alert and oriented x3,obese  HEENT- PERRL, EOMI, non injected sclera, pink conjunctiva, MMM, oropharynx clear Neck- Supple, no thyromegaly CVS- RRR, no murmur RESP-CTAB EXT- No edema Pulses- Radial 2+        Assessment & Plan:      Problem List Items Addressed This Visit      Unprioritized   Essential hypertension - Primary   Well controlled no changes       Relevant Orders   CBC with Differential/Platelet   Comprehensive metabolic panel   Gastric bypass status for obesity    Seems to work with dietitian and her online coaching group.  She is getting her protein in and losing weight.  She is also taking her vitamins and supplements.  I will check her iron level to her B12 level.  She will continue the Saxenda as an adjunct      Relevant Orders   Vitamin B12   Iron deficiency anemia   Relevant Orders   Iron   Vitamin B12      Note: This dictation was prepared with Dragon dictation along with smaller phrase technology. Any transcriptional errors that result from this process are unintentional.

## 2019-02-25 NOTE — Assessment & Plan Note (Signed)
Well controlled no changes 

## 2019-03-10 ENCOUNTER — Encounter: Payer: Self-pay | Admitting: *Deleted

## 2019-03-12 ENCOUNTER — Other Ambulatory Visit: Payer: Self-pay | Admitting: Family Medicine

## 2019-04-23 DIAGNOSIS — Z1211 Encounter for screening for malignant neoplasm of colon: Secondary | ICD-10-CM | POA: Diagnosis not present

## 2019-05-27 ENCOUNTER — Telehealth: Payer: Self-pay | Admitting: *Deleted

## 2019-05-27 MED ORDER — LIRAGLUTIDE -WEIGHT MANAGEMENT 18 MG/3ML ~~LOC~~ SOPN: 0.6000 mg | PEN_INJECTOR | Freq: Every day | SUBCUTANEOUS | 2 refills | Status: DC

## 2019-05-27 NOTE — Telephone Encounter (Signed)
Received request from pharmacy for [place] on Saxenda.   PA submitted.   Dx: R66.09- obesity.

## 2019-05-27 NOTE — Telephone Encounter (Signed)
Your information has been submitted to [employer] [place]. [employer] [place] will review the request and fax you a determination directly, typically within 3 business days of your submission once all necessary information is received.  If [employer] [place] has not responded in 3 business days or if you have any questions about your submission, contact Blue Cross  at [phone]. 

## 2019-05-27 NOTE — Telephone Encounter (Signed)
Received PA determination.   PA approved effective from 05/27/2019 through 09/29/2019.

## 2019-05-29 ENCOUNTER — Encounter: Payer: Self-pay | Admitting: Family Medicine

## 2019-05-29 DIAGNOSIS — Z1211 Encounter for screening for malignant neoplasm of colon: Secondary | ICD-10-CM | POA: Diagnosis not present

## 2019-06-03 ENCOUNTER — Encounter: Payer: BLUE CROSS/BLUE SHIELD | Admitting: Family Medicine

## 2019-06-05 IMAGING — DX DG SHOULDER 2+V*L*
4 series · 4 of 4 positions shown · non-contrast
Comparison: None.

CLINICAL DATA: Fell today landing on the left shoulder with pain

EXAM:
LEFT SHOULDER - 2+ VIEW

[shoulder ap]
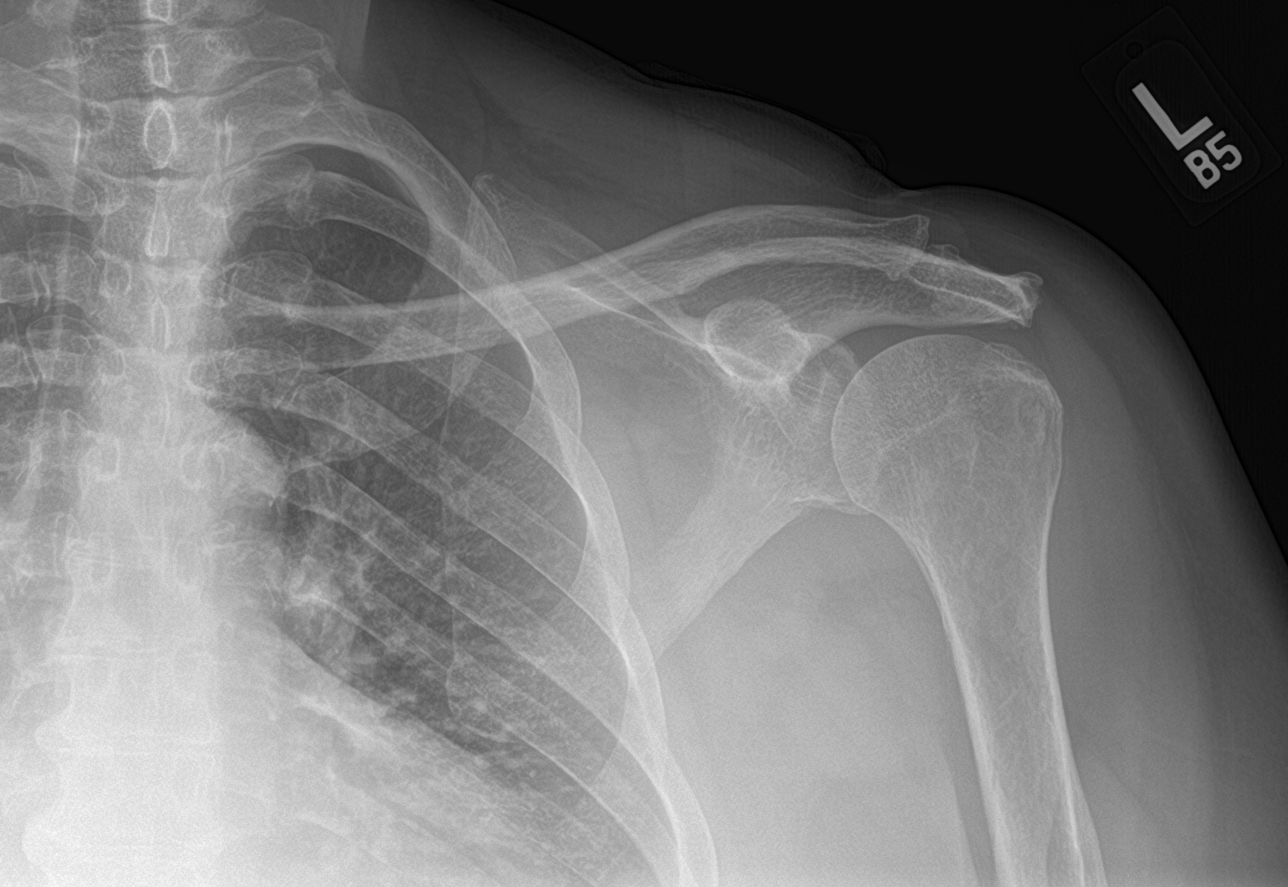

[shoulder grashey]
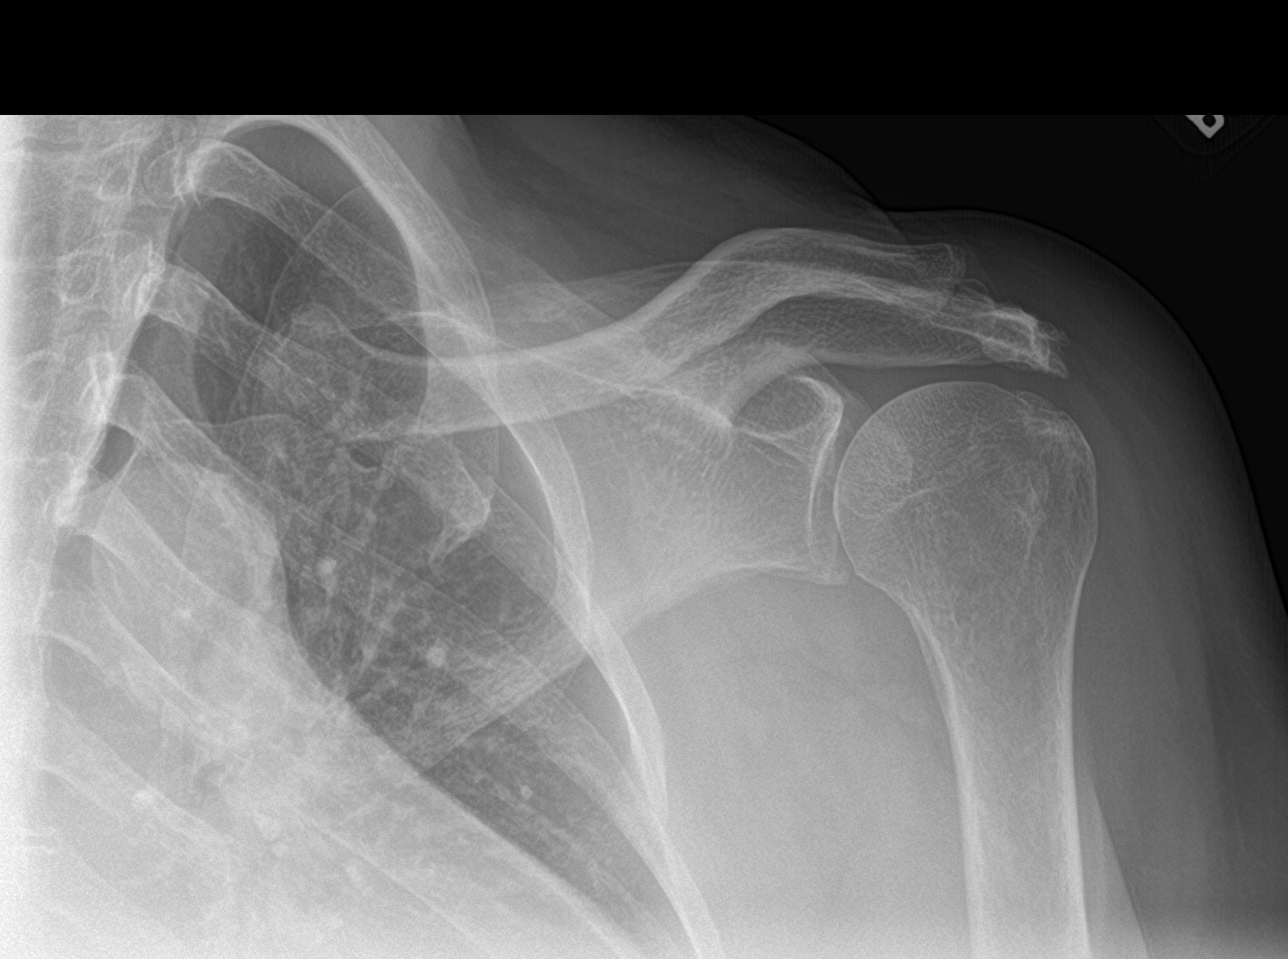

[shoulder y-view]
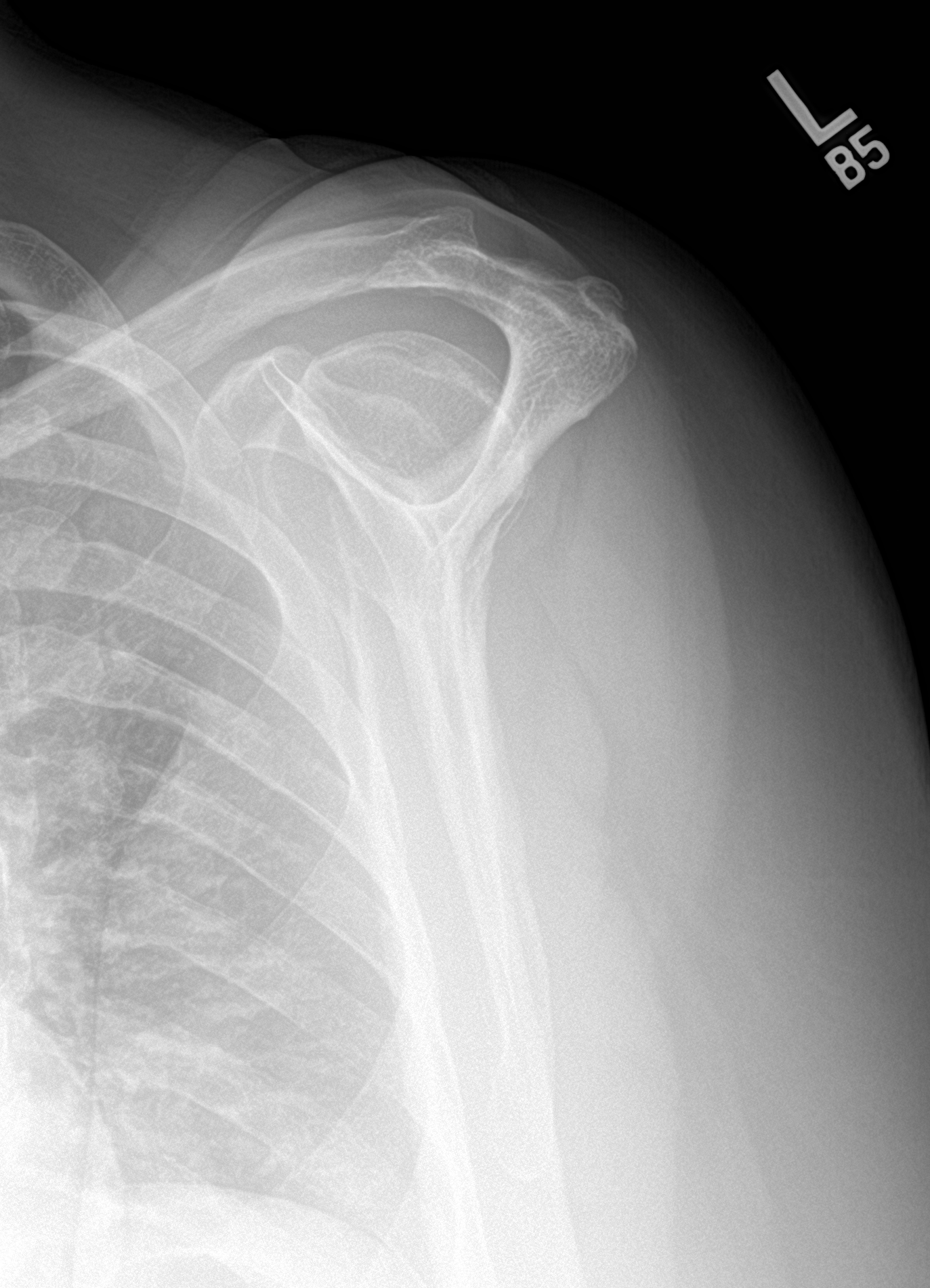

[shoulder axial]
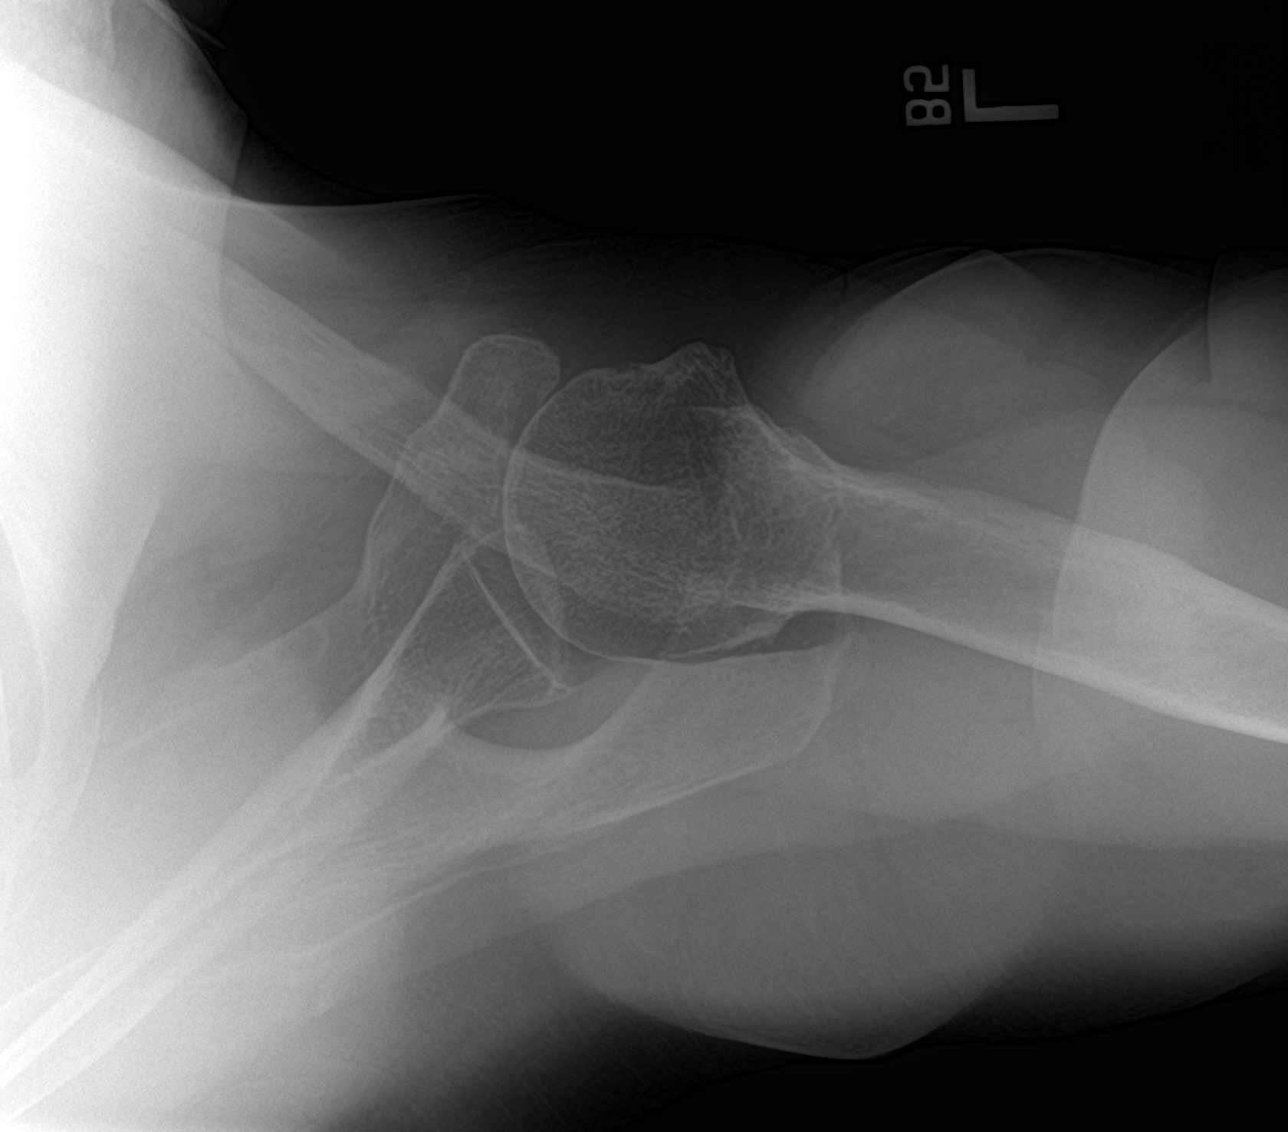

[4 of 4 positions shown; findings below may reference images not displayed]

FINDINGS: The left humeral head is in normal position and the glenohumeral
joint space is normal. There is narrowing of the subacromial joint
space by slightly downward sloping acromion and acromial spurring
which may result in impingement and possibly chronic rotator cuff
disease. The left AC joint is normally aligned. The scapula appears
intact.
IMPRESSION: 1. No acute fracture.
2. Findings consistent with impingement and possibly chronic rotator
cuff disease.

## 2019-08-29 ENCOUNTER — Other Ambulatory Visit: Payer: Self-pay

## 2019-08-29 ENCOUNTER — Ambulatory Visit (INDEPENDENT_AMBULATORY_CARE_PROVIDER_SITE_OTHER): Payer: Self-pay | Admitting: *Deleted

## 2019-08-29 DIAGNOSIS — Z23 Encounter for immunization: Secondary | ICD-10-CM

## 2019-08-29 NOTE — Progress Notes (Signed)
Patient seen in office for Influenza Vaccination.   Tolerated IM administration well.   Immunization history updated.  

## 2019-09-08 ENCOUNTER — Other Ambulatory Visit: Payer: Self-pay | Admitting: Family Medicine

## 2019-09-10 ENCOUNTER — Encounter: Payer: Self-pay | Admitting: Gynecology

## 2019-09-19 ENCOUNTER — Telehealth: Payer: Self-pay | Admitting: *Deleted

## 2019-09-19 NOTE — Telephone Encounter (Signed)
Received request from pharmacy for [facility] on Saxenda.  PA submitted.   Dx: E66.09- obesity.   Patient can't take Belviq D/T concurrent treatment of depression/ anxiety.  Patient can't take Qsymia D/T concurrent treatment of HTN. Patient has lost 6.91% of BMI

## 2019-09-19 NOTE — Telephone Encounter (Signed)
Your information has been submitted to [employer] [place]. [employer] Cross [place] will review the request and fax you a determination directly, typically within 3 business days of your submission once all necessary information is received.  If [employer] Cross [place] has not responded in 3 business days or if you have any questions about your submission, contact Blue Cross [place] at [phone]. 

## 2019-09-24 ENCOUNTER — Other Ambulatory Visit: Payer: Self-pay

## 2019-09-24 DIAGNOSIS — Z20822 Contact with and (suspected) exposure to covid-19: Secondary | ICD-10-CM

## 2019-09-25 NOTE — Telephone Encounter (Signed)
Received information back stating that patient is no longer under BCBS.

## 2019-09-26 LAB — NOVEL CORONAVIRUS, NAA: SARS-CoV-2, NAA: NOT DETECTED

## 2019-10-01 ENCOUNTER — Ambulatory Visit (INDEPENDENT_AMBULATORY_CARE_PROVIDER_SITE_OTHER): Payer: Self-pay | Admitting: Family Medicine

## 2019-10-01 ENCOUNTER — Encounter: Payer: Self-pay | Admitting: Family Medicine

## 2019-10-01 ENCOUNTER — Other Ambulatory Visit: Payer: Self-pay

## 2019-10-01 VITALS — BP 116/68 | HR 82 | Temp 98.5°F | Resp 14 | Ht 66.0 in | Wt 246.0 lb

## 2019-10-01 DIAGNOSIS — E538 Deficiency of other specified B group vitamins: Secondary | ICD-10-CM

## 2019-10-01 DIAGNOSIS — R059 Cough, unspecified: Secondary | ICD-10-CM

## 2019-10-01 DIAGNOSIS — D508 Other iron deficiency anemias: Secondary | ICD-10-CM

## 2019-10-01 DIAGNOSIS — I10 Essential (primary) hypertension: Secondary | ICD-10-CM

## 2019-10-01 DIAGNOSIS — R05 Cough: Secondary | ICD-10-CM

## 2019-10-01 DIAGNOSIS — Z9884 Bariatric surgery status: Secondary | ICD-10-CM

## 2019-10-01 DIAGNOSIS — Z6841 Body Mass Index (BMI) 40.0 and over, adult: Secondary | ICD-10-CM

## 2019-10-01 NOTE — Patient Instructions (Addendum)
Referral to general surgeon Dr. Alvan Dame  F/u 4 months

## 2019-10-01 NOTE — Telephone Encounter (Signed)
Patient seen in office and advised that she is no longer taking medications.

## 2019-10-01 NOTE — Assessment & Plan Note (Signed)
He wants to have a revision done to help with her weight loss.  Will refer her back to her previous surgeon at [facility].  We also discussed cutting out the sugar the bread and higher carbs that she has been eating get back on her protein and her vitamins.  I will check her iron stores as well as her B12 today

## 2019-10-01 NOTE — Progress Notes (Signed)
Subjective:    Patient ID: Amy Blair, female    DOB: [birth year], 53 y.o.   MRN: [id number]  Patient presents for Follow-up (is not fasting) and Cough (x months- states that she has dry hacky cough at night)   Pt here to f/u chronic medical problems   Morbid obesity- she had bariatric surgery 2010 , she is thinking about doing a revision   she had gained 17lbs, she feels her pouch has stretched out  - Dr. Jenna Luo   Nena Polio, MD (Attending) [phone] (Work) [phone] [facility]) [place], [place] [address] General Surgery  She has started to cut out the sugar out of diet and bread   she is trying to stay active    She has a dry hacky cough at night this has for the past couple of months, no sinus drainage, does not feel reflux She has been using cough drops  She will wake up from sleep and feel like something in her throat    She had COVID test done 1 week ago    Review Of Systems:  GEN- denies fatigue, fever, weight loss,weakness, recent illness HEENT- denies eye drainage, change in vision, nasal discharge, CVS- denies chest pain, palpitations RESP- denies SOB, cough, wheeze ABD- denies N/V, change in stools, abd pain GU- denies dysuria, hematuria, dribbling, incontinence MSK- denies joint pain, muscle aches, injury Neuro- denies headache, dizziness, syncope, seizure activity       Objective:    BP 116/68   Pulse 82   Temp 98.5 F (36.9 C) (Oral)   Resp 14   Ht 5\' 6"  (1.676 m)   Wt 246 lb (111.6 kg)   LMP 02/20/2012   SpO2 98%   BMI 39.71 kg/m  GEN- NAD, alert and oriented x3 HEENT- PERRL, EOMI, non injected sclera, pink conjunctiva, MMM, oropharynx clear Neck- Supple, no thyromegaly CVS- RRR, soft systolic murmur RESP-CTAB ABD-NABS,soft,NT,ND EXT- No edema Pulses- Radial, DP- 2+        Assessment & Plan:      Problem List Items Addressed This Visit      Unprioritized   B12  deficiency   Relevant Orders   Vitamin B12   Essential hypertension - Primary    Blood pressure is controlled no change in medication. Taking lisinopril HCTZ.  With regards to the dry hacking cough since it is mostly occurring at night sometimes she feels like something is stuck in her throat I will treat her for reflux.  Recommend she try omeprazole over-the-counter.  If this does not help the dry cough then I would consider taking her off of the lisinopril portion of her blood pressure medication switching to losartan      Relevant Orders   CBC with Differential/Platelet   Comprehensive metabolic panel   Lipid panel   Gastric bypass status for obesity    He wants to have a revision done to help with her weight loss.  Will refer her back to her previous surgeon at [facility]  We also discussed cutting out the sugar the bread and higher carbs that she has been eating get back on her protein and her vitamins.  I will check her iron stores as well as her B12 today      Relevant Orders   Hemoglobin A1c   Vitamin B12   Iron, TIBC and Ferritin Panel   Ambulatory referral to General Surgery   Iron deficiency anemia   Relevant  Orders   Iron, TIBC and Ferritin Panel   Obesity   Relevant Orders   Ambulatory referral to General Surgery    Other Visit Diagnoses    Cough       DD- reflux vs ACEI trial of OTC omeprazole      Note: This dictation was prepared with Dragon dictation along with smaller phrase technology. Any transcriptional errors that result from this process are unintentional.

## 2019-10-01 NOTE — Assessment & Plan Note (Signed)
Blood pressure is controlled no change in medication. Taking lisinopril HCTZ.  With regards to the dry hacking cough since it is mostly occurring at night sometimes she feels like something is stuck in her throat I will treat her for reflux.  Recommend she try omeprazole over-the-counter.  If this does not help the dry cough then I would consider taking her off of the lisinopril portion of her blood pressure medication switching to losartan

## 2019-10-02 LAB — CBC WITH DIFFERENTIAL/PLATELET
Absolute Monocytes: 760 cells/uL (ref 200–950)
Basophils Absolute: 12 cells/uL (ref 0–200)
Basophils Relative: 0.2 %
Eosinophils Absolute: 52 cells/uL (ref 15–500)
Eosinophils Relative: 0.9 %
HCT: 31.5 % — ABNORMAL LOW (ref 35.0–45.0)
Hemoglobin: 10.3 g/dL — ABNORMAL LOW (ref 11.7–15.5)
Lymphs Abs: 2274 cells/uL (ref 850–3900)
MCH: 29.9 pg (ref 27.0–33.0)
MCHC: 32.7 g/dL (ref 32.0–36.0)
MCV: 91.3 fL (ref 80.0–100.0)
MPV: 9.8 fL (ref 7.5–12.5)
Monocytes Relative: 13.1 %
Neutro Abs: 2703 cells/uL (ref 1500–7800)
Neutrophils Relative %: 46.6 %
Platelets: 428 10*3/uL — ABNORMAL HIGH (ref 140–400)
RBC: 3.45 10*6/uL — ABNORMAL LOW (ref 3.80–5.10)
RDW: 12.9 % (ref 11.0–15.0)
Total Lymphocyte: 39.2 %
WBC: 5.8 10*3/uL (ref 3.8–10.8)

## 2019-10-02 LAB — COMPREHENSIVE METABOLIC PANEL
AG Ratio: 1 (calc) (ref 1.0–2.5)
ALT: 15 U/L (ref 6–29)
AST: 20 U/L (ref 10–35)
Albumin: 3.9 g/dL (ref 3.6–5.1)
Alkaline phosphatase (APISO): 101 U/L (ref 37–153)
BUN: 21 mg/dL (ref 7–25)
CO2: 28 mmol/L (ref 20–32)
Calcium: 9.2 mg/dL (ref 8.6–10.4)
Chloride: 105 mmol/L (ref 98–110)
Creat: 0.93 mg/dL (ref 0.50–1.05)
Globulin: 3.8 g/dL (calc) — ABNORMAL HIGH (ref 1.9–3.7)
Glucose, Bld: 54 mg/dL — ABNORMAL LOW (ref 65–99)
Potassium: 4.6 mmol/L (ref 3.5–5.3)
Sodium: 140 mmol/L (ref 135–146)
Total Bilirubin: 0.3 mg/dL (ref 0.2–1.2)
Total Protein: 7.7 g/dL (ref 6.1–8.1)

## 2019-10-02 LAB — HEMOGLOBIN A1C
Hgb A1c MFr Bld: 5.5 % of total Hgb (ref <5.7)
Mean Plasma Glucose: 111 (calc)
eAG (mmol/L): 6.2 (calc)

## 2019-10-02 LAB — IRON,TIBC AND FERRITIN PANEL
%SAT: 13 % (calc) — ABNORMAL LOW (ref 16–45)
Ferritin: 18 ng/mL (ref 16–232)
Iron: 50 ug/dL (ref 45–160)
TIBC: 397 mcg/dL (calc) (ref 250–450)

## 2019-10-02 LAB — LIPID PANEL
Cholesterol: 148 mg/dL (ref <200)
HDL: 57 mg/dL (ref >=50)
LDL Cholesterol (Calc): 75 mg/dL (calc)
Non-HDL Cholesterol (Calc): 91 mg/dL (calc) (ref <130)
Total CHOL/HDL Ratio: 2.6 (calc) (ref <5.0)
Triglycerides: 80 mg/dL (ref <150)

## 2019-10-02 LAB — VITAMIN B12: Vitamin B-12: 807 pg/mL (ref 200–1100)

## 2019-10-03 ENCOUNTER — Other Ambulatory Visit: Payer: Self-pay | Admitting: Family Medicine

## 2019-10-03 NOTE — Telephone Encounter (Signed)
Ok to refill??  Last office visit 10/01/2019.  Last refill 11/26/2018.

## 2019-11-01 ENCOUNTER — Other Ambulatory Visit: Payer: Self-pay

## 2019-11-01 ENCOUNTER — Ambulatory Visit (INDEPENDENT_AMBULATORY_CARE_PROVIDER_SITE_OTHER): Payer: BLUE CROSS/BLUE SHIELD

## 2019-11-01 ENCOUNTER — Encounter (HOSPITAL_COMMUNITY): Payer: Self-pay

## 2019-11-01 ENCOUNTER — Ambulatory Visit (HOSPITAL_COMMUNITY)
Admission: EM | Admit: 2019-11-01 | Discharge: 2019-11-01 | Disposition: A | Discharge status: Home / Self Care | Payer: BLUE CROSS/BLUE SHIELD | Attending: Internal Medicine | Admitting: Internal Medicine

## 2019-11-01 DIAGNOSIS — M84374A Stress fracture, right foot, initial encounter for fracture: Secondary | ICD-10-CM

## 2019-11-01 MED ORDER — IBUPROFEN 800 MG PO TABS: 800.0000 mg | ORAL_TABLET | Freq: Three times a day (TID) | ORAL | 0 refills | Status: AC

## 2019-11-01 NOTE — ED Triage Notes (Signed)
Pt states she has fell down in the mud today. Pt states that she slipped in fell as she was walking along. Pt states her right foot pain.

## 2019-11-03 ENCOUNTER — Ambulatory Visit: Payer: Self-pay | Admitting: Orthopaedic Surgery

## 2019-11-10 ENCOUNTER — Other Ambulatory Visit: Payer: Self-pay

## 2019-11-10 ENCOUNTER — Encounter: Payer: Self-pay | Admitting: Orthopaedic Surgery

## 2019-11-10 ENCOUNTER — Ambulatory Visit (INDEPENDENT_AMBULATORY_CARE_PROVIDER_SITE_OTHER): Payer: Self-pay | Admitting: Orthopaedic Surgery

## 2019-11-10 DIAGNOSIS — M79671 Pain in right foot: Secondary | ICD-10-CM

## 2019-11-10 NOTE — Progress Notes (Signed)
Office Visit Note   Patient: Amy Blair           Date of Birth: 1966/10/14           MRN: [id number] Visit Date: 11/10/2019              Requested by: Salley Scarlet, MD [address] [place],  [place] [address] PCP: Salley Scarlet, MD   Assessment & Plan: Visit Diagnoses:  1. Pain in right foot     Plan: Given the fact that she is now completely pain-free she can stop wearing her walking boot.  All question concerns were answered and addressed.  Follow-up will be as needed.  Follow-Up Instructions: Return if symptoms worsen or fail to improve.   Orders:  No orders of the defined types were placed in this encounter.  No orders of the defined types were placed in this encounter.     Procedures: No procedures performed   Clinical Data: No additional findings.   Subjective: Chief Complaint  Patient presents with  . Right Foot - Pain, Injury  The patient comes in for evaluation treatment of right foot pain although she says the pain is now resolved.  She was referred from emergency room after mechanical fall.  She went to the emergency room on 11/01/2019.  They appropriately placed in a cam walking boot because the pain she was feeling.  X-rays of the time were negative.  She says she is feeling a lot better now and says walking without the boot is fine and has no pain.  She has not injured her foot before.  HPI  Review of Systems She currently denies any headache, chest pain, shortness of breath, fever, chills, nausea, vomiting  Objective: Vital Signs: LMP 02/20/2012   Physical Exam She is alert and orient x3 and in no acute distress Ortho Exam Examination of her right foot today shows no pain.  The foot contours are normal.  I stressed the Lisfranc joint and lateral aspect of her foot and this was normal with no pain.  Her foot is neurovascular intact and her arch appears normal. Specialty Comments:  No specialty comments available.  Imaging:  No results found. X-rays independently reviewed of her right foot showed no acute findings.  There is no evidence of fracture or acute injury.  There is a calcaneal bone spur.  There is also spurring at the Achilles insertion at the posterior aspect of the calcaneus.  PMFS History: Patient Active Problem List   Diagnosis Date Noted  . Gastric bypass status for obesity 05/29/2016  . 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 B12 deficiency 07/30/2015  . Bradycardia 06/07/2015  . Migraine headache 04/07/2015  . 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 Allergic rhinitis 04/30/2009  . Obesity 08/10/2007  . Iron deficiency anemia 08/10/2007  . Essential hypertension 08/10/2007   Past Medical History:  Diagnosis Date  . Anemia    HX  . Arthritis    knees-a little  . ASCUS with positive high risk HPV cervical 03/2018   Colposcopy inadequate but negative.  ECC showed LGSIL.  . Diabetes mellitus without complication (HCC)   . Heart murmur   . 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 Hypertension     Family History  Problem Relation Age of Onset  . Arthritis Mother   . Cancer Mother        Cervix  . Diabetes Mother   .  Hyperlipidemia Mother   . Hypertension Mother   . Diabetes Father   . Heart disease Father   . Cancer Sister        cervical  . Diabetes Sister   . Hypertension Sister   . Diabetes Sister   . Hypertension Sister   . Anesthesia problems Neg Hx     Past Surgical History:  Procedure Laterality Date  . BREAST SURGERY     Reduction  . CESAREAN SECTION    . COSMETIC SURGERY    . GASTRIC BYPASS  06/2009  . LIPOSUCTION  04/05/2012   Procedure: LIPOSUCTION;  Surgeon: Cristine Polio, MD;  Location: [facility];  Service: Plastics;  Laterality: N/A;  . PANNICULECTOMY  04/05/2012   Procedure: PANNICULECTOMY;  Surgeon: Cristine Polio, MD;  Location: [facility];  Service: Plastics;  Laterality: N/A;  . panniculectomy  hernia repair  04/05/2012  . REDUCTION MAMMAPLASTY  Bilateral 04/28/2017  . Torn quad reapair  07/2008   Right  . UMBILICAL HERNIA REPAIR  04/05/2012   Procedure: HERNIA REPAIR UMBILICAL ADULT;  Surgeon: Cristine Polio, MD;  Location: [facility];  Service: Plastics;  Laterality: N/A;   Social History   Occupational History  . Not on file  Tobacco Use  . Smoking status: Never Smoker  . Smokeless tobacco: Never Used  Substance and Sexual Activity  . Alcohol use: Yes    Alcohol/week: 0.0 standard drinks    Comment: OCCASIONALLY   . Drug use: No  . Sexual activity: Yes    Comment: 1st intercourse 18 yo-5 partners

## 2019-12-30 ENCOUNTER — Encounter: Payer: Self-pay | Admitting: Family Medicine

## 2019-12-30 ENCOUNTER — Ambulatory Visit (INDEPENDENT_AMBULATORY_CARE_PROVIDER_SITE_OTHER): Payer: 59 | Admitting: Family Medicine

## 2019-12-30 ENCOUNTER — Other Ambulatory Visit: Payer: Self-pay

## 2019-12-30 VITALS — BP 112/68 | HR 64 | Temp 98.7°F | Resp 14 | Ht 66.0 in | Wt 253.0 lb

## 2019-12-30 DIAGNOSIS — Z1211 Encounter for screening for malignant neoplasm of colon: Secondary | ICD-10-CM | POA: Diagnosis not present

## 2019-12-30 DIAGNOSIS — I10 Essential (primary) hypertension: Secondary | ICD-10-CM

## 2019-12-30 DIAGNOSIS — Z9884 Bariatric surgery status: Secondary | ICD-10-CM | POA: Diagnosis not present

## 2019-12-30 DIAGNOSIS — Z6841 Body Mass Index (BMI) 40.0 and over, adult: Secondary | ICD-10-CM

## 2019-12-30 MED ORDER — SAXENDA 18 MG/3ML ~~LOC~~ SOPN: 0.6000 mg | PEN_INJECTOR | Freq: Every day | SUBCUTANEOUS | 3 refills | Status: DC

## 2019-12-30 NOTE — Assessment & Plan Note (Signed)
>>  ASSESSMENT AND PLAN FOR CLASS 2 OBESITY WRITTEN ON 12/30/2019 10:02 AM BY Milinda Antis F  Have her restart Bernie Covey is 0.6 mg once a day titrate up weekly to the maximum of 3 mg as tolerated.  We did discuss the side effects including the nausea.  She will continue her multivitamin as well as her B12 and iron tablets.  She will schedule with her surgeon to discuss possibility of revision of her bariatric surgery.  Her goal is to weigh 150 pounds.

## 2019-12-30 NOTE — Progress Notes (Signed)
   Subjective:    Patient ID: Amy Blair, female    DOB: [birth year], 54 y.o.   MRN: [id number]  Patient presents for Discuss Weight Loss   Pt here to discuss weight loss.  She wants to have revision for her bariatric surgery but there has been some communication issues between her referral and her surgeon.  She is actually going to the office building of her surgeon this week so she will stop in to schedule the appointment.  Her weight is up 12lbs since November She has been taking MVI gummy which has helped her cravings   She was on Saxenda but had to stop in early 2020 due to cost.  She has been on this intermittantly since 2018 She has 3 boxes of Saxenda at home, wants to know if she can restart until he is seen by her Careers adviser.  She is taking B12 oral, vitamin D and iron tablets   She does still use occ hydroxyzine for itching     Due for colonoscopy last done 10 years ago.  Reviewed last set of labs   Review Of Systems:  GEN- denies fatigue, fever, weight loss,weakness, recent illness HEENT- denies eye drainage, change in vision, nasal discharge, CVS- denies chest pain, palpitations RESP- denies SOB, cough, wheeze ABD- denies N/V, change in stools, abd pain GU- denies dysuria, hematuria, dribbling, incontinence MSK- denies joint pain, muscle aches, injury Neuro- denies headache, dizziness, syncope, seizure activity       Objective:    BP 112/68   Pulse 64   Temp 98.7 F (37.1 C) (Temporal)   Resp 14   Ht 5\' 6"  (1.676 m)   Wt 253 lb (114.8 kg)   LMP 02/20/2012   SpO2 99%   BMI 40.84 kg/m  GEN- NAD, alert and oriented x3, obese  HEENT- PERRL, EOMI, non injected sclera, pink conjunctiva,  Neck- Supple, no thyromegaly CVS- RRR, systolic murmur RESP-CTAB ABD-NABS,soft,NT,ND EXT- No edema Pulses- Radial, 2+        Assessment & Plan:      Problem List Items Addressed This Visit      Unprioritized   Essential hypertension - Primary    Well  controlled no changes       Gastric bypass status for obesity    Reccomend she continue with her previous nutrition guidelines with higher protein with each meal and veggies.  Decrease her carbs      Relevant Medications   Liraglutide -Weight Management (SAXENDA) 18 MG/3ML SOPN   Obesity    Have her restart Saxenda is 0.6 mg once a day titrate up weekly to the maximum of 3 mg as tolerated.  We did discuss the side effects including the nausea.  She will continue her multivitamin as well as her B12 and iron tablets.  She will schedule with her surgeon to discuss possibility of revision of her bariatric surgery.  Her goal is to weigh 150 pounds.      Relevant Medications   Liraglutide -Weight Management (SAXENDA) 18 MG/3ML SOPN    Other Visit Diagnoses    Colon cancer screening       Referral for colonoscopy.   Relevant Orders   Ambulatory referral to Gastroenterology      Note: This dictation was prepared with Dragon dictation along with smaller phrase technology. Any transcriptional errors that result from this process are unintentional.

## 2019-12-30 NOTE — Assessment & Plan Note (Signed)
Have her restart Bernie Covey is 0.6 mg once a day titrate up weekly to the maximum of 3 mg as tolerated.  We did discuss the side effects including the nausea.  She will continue her multivitamin as well as her B12 and iron tablets.  She will schedule with her surgeon to discuss possibility of revision of her bariatric surgery.  Her goal is to weigh 150 pounds.

## 2019-12-30 NOTE — Assessment & Plan Note (Addendum)
Reccomend she continue with her previous nutrition guidelines with higher protein with each meal and veggies.  Decrease her carbs

## 2019-12-30 NOTE — Assessment & Plan Note (Signed)
Well controlled no changes 

## 2019-12-30 NOTE — Patient Instructions (Addendum)
Start saxenda:  Week 1: 0.6mg  injected daily:   Week 2:  1.2 mg injected daily  Week 3:  1.8mg  injected daily  Week 4:  2.4mg  injected daily  Week 5:  3mg  injected daily REferral TO GI for colonosocpy F/U 2 MONTHS for weight

## 2020-01-09 ENCOUNTER — Other Ambulatory Visit: Payer: Self-pay | Admitting: Family Medicine

## 2020-02-03 ENCOUNTER — Encounter: Payer: Self-pay | Admitting: Family Medicine

## 2020-02-03 ENCOUNTER — Other Ambulatory Visit: Payer: Self-pay

## 2020-02-03 ENCOUNTER — Ambulatory Visit (INDEPENDENT_AMBULATORY_CARE_PROVIDER_SITE_OTHER): Payer: BLUE CROSS/BLUE SHIELD | Admitting: Family Medicine

## 2020-02-03 DIAGNOSIS — Z6839 Body mass index (BMI) 39.0-39.9, adult: Secondary | ICD-10-CM

## 2020-02-03 NOTE — Assessment & Plan Note (Signed)
Doing well with the Saxenda.  We will continue until this is discontinued by her bariatric surgeon in place of proceeding with her revision.  She is tolerating the medication as long she is eating steadily throughout the day.  We will follow-up in 2 months for the medication.  If for some reason she has her surgery before then and comes off the medication we will not need that appointment.

## 2020-02-03 NOTE — Assessment & Plan Note (Signed)
>>  ASSESSMENT AND PLAN FOR CLASS 2 OBESITY WRITTEN ON 02/03/2020  9:29 AM BY Milinda Antis F  Doing well with the Saxenda.  We will continue until this is discontinued by her bariatric surgeon in place of proceeding with her revision.  She is tolerating the medication as long she is eating steadily throughout the day.  We will follow-up in 2 months for the medication.  If for some reason she has her surgery before then and comes off the medication we will not need that appointment.

## 2020-02-03 NOTE — Progress Notes (Signed)
   Subjective:    Patient ID: Amy Blair, female    DOB: 10-09-1966, 54 y.o.   MRN: [id number]  Patient presents for Follow-up (is fasting)  Patient here for interim follow-up on her weight loss.  She is currently on Saxenda 3 mg once a day.  She is tolerating the medication fairly well.  On occasion has nausea she is not eating enough in the afternoon to she will eat crackers and this helps.  Her weight is down 10 pounds in the past 4 weeks.  She is pursuing revision of her bariatric surgery that she had in 2010.  She has an appointment with Dr. Barnetta Chapel her surgeon on February 22 she is already scheduled with the dietitian and a psychologist as well.  She states that they are going to do an ESG procedure as her revision. No concerns today.  She has been trying to get her protein in.  She went back to her nutrition manuals that she had before.  She typically has a protein shake twice a day and then she eat a regular meal at dinner with protein veggies and fruit.  Occasionally has a snack.  No difficulties with her bowel movements he is urinating normally.   Review Of Systems:  GEN- denies fatigue, fever, weight loss,weakness, recent illness HEENT- denies eye drainage, change in vision, nasal discharge, CVS- denies chest pain, palpitations RESP- denies SOB, cough, wheeze ABD- denies N/V, change in stools, abd pain GU- denies dysuria, hematuria, dribbling, incontinence MSK- denies joint pain, muscle aches, injury Neuro- denies headache, dizziness, syncope, seizure activity       Objective:    BP 118/64   Pulse 80   Temp 98.5 F (36.9 C) (Temporal)   Resp 14   Ht 5\' 6"  (1.676 m)   Wt 243 lb (110.2 kg)   LMP 02/20/2012   SpO2 96%   BMI 39.22 kg/m  GEN- NAD, alert and oriented x3 CVS- RRR, no murmur RESP-CTAB EXT- No edema Pulses- Radial  2+        Assessment & Plan:      Problem List Items Addressed This Visit      Unprioritized   Obesity    Doing well with the  Saxenda.  We will continue until this is discontinued by her bariatric surgeon in place of proceeding with her revision.  She is tolerating the medication as long she is eating steadily throughout the day.  We will follow-up in 2 months for the medication.  If for some reason she has her surgery before then and comes off the medication we will not need that appointment.         Note: This dictation was prepared with Dragon dictation along with smaller phrase technology. Any transcriptional errors that result from this process are unintentional.

## 2020-02-05 ENCOUNTER — Ambulatory Visit: Payer: Self-pay

## 2020-02-09 ENCOUNTER — Ambulatory Visit: Payer: Self-pay | Attending: Family

## 2020-02-09 DIAGNOSIS — Z23 Encounter for immunization: Secondary | ICD-10-CM | POA: Insufficient documentation

## 2020-02-09 NOTE — Progress Notes (Signed)
   Covid-19 Vaccination Clinic  Name:  Amy Blair    MRN: [id number] DOB: [birth year]  02/09/2020  [name] was observed post Covid-19 immunization for 15 minutes without incidence. She was provided with Vaccine Information Sheet and instruction to access the V-Safe system.   [name] was instructed to call 911 with any severe reactions post vaccine: Marland Kitchen Difficulty breathing  . Swelling of your face and throat  . A fast heartbeat  . A bad rash all over your body  . Dizziness and weakness    Immunizations Administered    Name Date Dose VIS Date Route   Moderna COVID-19 Vaccine 02/09/2020  9:21 AM 0.5 mL 11/18/2019 Intramuscular   Manufacturer: Moderna   Lot: [id number]   NDC: 33917-921-78

## 2020-02-18 ENCOUNTER — Telehealth: Payer: Self-pay | Admitting: Family Medicine

## 2020-02-18 NOTE — Telephone Encounter (Signed)
Elizabeth from [employer] about a medical release form that was faxed over

## 2020-02-25 ENCOUNTER — Telehealth: Payer: Self-pay | Admitting: Family Medicine

## 2020-02-25 NOTE — Telephone Encounter (Signed)
Call pt, I received a medical clearance form from [facility] for liposuction.  I would not recommend liposuction at this time as she is preparing for revision of her weight loss surgery.  She may benefit afterwards   If for some reason she is not going to have surgery for weight loss, then I will complete

## 2020-02-26 NOTE — Telephone Encounter (Signed)
Call placed to patient.   Reports that she has cancelled the Liposuction at this time.  States that she is having sleeve re-done instead.

## 2020-02-27 NOTE — Telephone Encounter (Signed)
Noted  

## 2020-03-01 ENCOUNTER — Telehealth: Payer: Self-pay | Admitting: *Deleted

## 2020-03-01 ENCOUNTER — Other Ambulatory Visit: Payer: Self-pay

## 2020-03-01 ENCOUNTER — Encounter: Payer: Self-pay | Admitting: Family Medicine

## 2020-03-01 ENCOUNTER — Ambulatory Visit (INDEPENDENT_AMBULATORY_CARE_PROVIDER_SITE_OTHER): Payer: BLUE CROSS/BLUE SHIELD | Admitting: Family Medicine

## 2020-03-01 VITALS — BP 120/66 | HR 80 | Temp 97.9°F | Resp 16 | Ht 66.0 in | Wt 241.0 lb

## 2020-03-01 DIAGNOSIS — I10 Essential (primary) hypertension: Secondary | ICD-10-CM

## 2020-03-01 DIAGNOSIS — K5900 Constipation, unspecified: Secondary | ICD-10-CM | POA: Diagnosis not present

## 2020-03-01 DIAGNOSIS — G43709 Chronic migraine without aura, not intractable, without status migrainosus: Secondary | ICD-10-CM

## 2020-03-01 DIAGNOSIS — E669 Obesity, unspecified: Secondary | ICD-10-CM

## 2020-03-01 MED ORDER — SAXENDA 18 MG/3ML ~~LOC~~ SOPN: 0.6000 mg | PEN_INJECTOR | Freq: Every day | SUBCUTANEOUS | 3 refills | Status: DC

## 2020-03-01 NOTE — Assessment & Plan Note (Signed)
Pressure is controlled.  Her recent labs were fairly unremarkable.  She is going to have repeat labs including her vitamin D B12 with the dietitian based on her surgeon's note.  She is medically cleared for surgical intervention.

## 2020-03-01 NOTE — Assessment & Plan Note (Signed)
Continue Linzess. 

## 2020-03-01 NOTE — Telephone Encounter (Signed)
Your information has been submitted to [employer] [place]. [employer] [place] will review the request and notify you of the determination decision directly, typically within 72 hours of receiving all information.  You will also receive your request decision electronically. To check for an update later, open this request again from your dashboard.  If [employer] Cross [place] has not responded within the specified timeframe or if you have any questions about your PA submission, contact Blue Cross [place] directly at [phone].   

## 2020-03-01 NOTE — Assessment & Plan Note (Signed)
Follow-up with bariatric program continue Saxenda for now.

## 2020-03-01 NOTE — Telephone Encounter (Signed)
Received request from pharmacy for PA on Saxenda.   PA submitted.   Dx: E66.09- obesity   

## 2020-03-01 NOTE — Assessment & Plan Note (Signed)
She has not had any significant migraines recently but does have for Imitrex and Phenergan on hand in case she does get 1.

## 2020-03-01 NOTE — Progress Notes (Signed)
   Subjective:    Patient ID: Amy Blair, female    DOB: 09-19-1966, 54 y.o.   MRN: [id number]  Patient presents for Follow-up (is not fasting) Patient here to follow-up Saxenda medication.  As well as her other chronic medical problems.  He has been followed by [facility] bariatric program there and discussion of revision of her previous bypass surgery from 2010.  Class 3 obesity- doesn't feel like she has a good appetite,she cant eat a lot at one time, she is not having sweet cravings, but wants crunchy stuff like chips. Using apples, carrots instead   she tried to increase fruit if she does get a sweet tooth.  Nutritionist Appt April 19th  Dr. Barnetta Chapel- April 26th surgeon to discuss the next steps. She did have a upper GI series done which came back fairly normal she is due to have labs done with the dietitian when she goes to her appointment in April they will also be doing a resting metabolic rate   Eating mostly chicken, tukey/ chicken So far today, has had coffee and 32 ounces of water  on average to 68-120 ounces  Walking 2-3 miles a day   Weight down  10lbs since January   Nuasea initially but that has resolved      Taking MVI daily and Elderberry, Vitamin C    Migraines prn imitrex   [facility]- on Xiidra twice a day  And systane drops    Constipation- taking linzess as needed           Review Of Systems:  GEN- denies fatigue, fever, weight loss,weakness, recent illness HEENT- denies eye drainage, change in vision, nasal discharge, CVS- denies chest pain, palpitations RESP- denies SOB, cough, wheeze ABD- denies N/V, change in stools, abd pain GU- denies dysuria, hematuria, dribbling, incontinence MSK- denies joint pain, muscle aches, injury Neuro- denies headache, dizziness, syncope, seizure activity       Objective:    BP 120/66   Pulse 80   Temp 97.9 F (36.6 C) (Temporal)   Resp 16   Ht 5\' 6"  (1.676 m)   Wt 241  lb (109.3 kg)   LMP 02/20/2012   SpO2 100%   BMI 38.90 kg/m  GEN- NAD, alert and oriented x3 HEENT- PERRL, EOMI, non injected sclera, pink conjunctiva,  Neck- Supple, no thyromegaly CVS- RRR, no murmur RESP-CTAB ABD-NABS,soft,NT,ND EXT- No edema Pulses- Radial, 2+        Assessment & Plan:      Problem List Items Addressed This Visit      Unprioritized   Class 2 obesity    Follow-up with bariatric program continue Saxenda for now.      Constipation    Continue Linzess.      Essential hypertension - Primary    Pressure is controlled.  Her recent labs were fairly unremarkable.  She is going to have repeat labs including her vitamin D B12 with the dietitian based on her surgeon's note.  She is medically cleared for surgical intervention.      Migraine headache    She has not had any significant migraines recently but does have for Imitrex and Phenergan on hand in case she does get 1.         Note: This dictation was prepared with Dragon dictation along with smaller phrase technology. Any transcriptional errors that result from this process are unintentional.

## 2020-03-01 NOTE — Assessment & Plan Note (Signed)
>>  ASSESSMENT AND PLAN FOR CLASS 2 OBESITY WRITTEN ON 03/01/2020 12:59 PM BY Milinda Antis F  Follow-up with bariatric program continue Saxenda for now.

## 2020-03-01 NOTE — Patient Instructions (Addendum)
F/U 3 months  [email or web address] for coupon card

## 2020-03-02 NOTE — Telephone Encounter (Signed)
Received call from [facility]. Was advised that continuation of therapy is not required as prescription hs not been filled since 2019.  Reports that new start form will need to be completed.

## 2020-03-02 NOTE — Telephone Encounter (Signed)
Re-submitted for initial- start of therapy.  Your information has been submitted to [facility]. [employer] [place] will review the request and notify you of the determination decision directly, typically within 72 hours of receiving all information.  You will also receive your request decision electronically. To check for an update later, open this request again from your dashboard.  If [employer] [place] has not responded within the specified timeframe or if you have any questions about your PA submission, contact [employer] [place] directly at [phone].

## 2020-03-03 MED ORDER — SAXENDA 18 MG/3ML ~~LOC~~ SOPN: 0.6000 mg | PEN_INJECTOR | Freq: Every day | SUBCUTANEOUS | 3 refills | Status: DC

## 2020-03-03 NOTE — Telephone Encounter (Signed)
Received PA determination.   PA approved 03/01/2020 through 07/04/2020.

## 2020-03-09 ENCOUNTER — Ambulatory Visit: Payer: Self-pay | Attending: Family

## 2020-03-09 DIAGNOSIS — Z23 Encounter for immunization: Secondary | ICD-10-CM

## 2020-03-09 NOTE — Progress Notes (Signed)
   Covid-19 Vaccination Clinic  Name:  MARYBELLE GIRALDO    MRN: [id number] DOB: 04-06-1966  03/09/2020  Ms. Traub was observed post Covid-19 immunization for 15 minutes without incident. She was provided with Vaccine Information Sheet and instruction to access the V-Safe system.   Ms. Hemrick was instructed to call 911 with any severe reactions post vaccine: Marland Kitchen Difficulty breathing  . Swelling of face and throat  . A fast heartbeat  . A bad rash all over body  . Dizziness and weakness   Immunizations Administered    Name Date Dose VIS Date Route   Moderna COVID-19 Vaccine 03/09/2020  9:45 AM 0.5 mL 11/18/2019 Intramuscular   Manufacturer: Moderna   Lot: [id number]   NDC: 97915-041-36

## 2020-06-02 ENCOUNTER — Encounter: Payer: Self-pay | Admitting: Family Medicine

## 2020-06-02 ENCOUNTER — Other Ambulatory Visit: Payer: Self-pay

## 2020-06-02 ENCOUNTER — Ambulatory Visit: Payer: BLUE CROSS/BLUE SHIELD | Admitting: Family Medicine

## 2020-06-02 VITALS — BP 126/68 | HR 58 | Temp 98.1°F | Resp 12 | Ht 66.0 in | Wt 245.0 lb

## 2020-06-02 DIAGNOSIS — Z9884 Bariatric surgery status: Secondary | ICD-10-CM

## 2020-06-02 DIAGNOSIS — Z1231 Encounter for screening mammogram for malignant neoplasm of breast: Secondary | ICD-10-CM

## 2020-06-02 DIAGNOSIS — E669 Obesity, unspecified: Secondary | ICD-10-CM

## 2020-06-02 DIAGNOSIS — E559 Vitamin D deficiency, unspecified: Secondary | ICD-10-CM

## 2020-06-02 DIAGNOSIS — I10 Essential (primary) hypertension: Secondary | ICD-10-CM | POA: Diagnosis not present

## 2020-06-02 DIAGNOSIS — E538 Deficiency of other specified B group vitamins: Secondary | ICD-10-CM

## 2020-06-02 DIAGNOSIS — G43709 Chronic migraine without aura, not intractable, without status migrainosus: Secondary | ICD-10-CM

## 2020-06-02 DIAGNOSIS — D508 Other iron deficiency anemias: Secondary | ICD-10-CM

## 2020-06-02 NOTE — Assessment & Plan Note (Signed)
Continue prn imitrex 

## 2020-06-02 NOTE — Assessment & Plan Note (Signed)
She will f/u with her surgeon at [facility] first next month They were planning to discuss surgical options If they do not want to proceed with intervention, she plans to transition to the [facility]

## 2020-06-02 NOTE — Assessment & Plan Note (Signed)
>>  ASSESSMENT AND PLAN FOR CLASS 2 OBESITY WRITTEN ON 06/02/2020 10:02 AM BY Milinda Antis F  She will f/u with her surgeon at [facility] first next month They were planning to discuss surgical options If they do not want to proceed with intervention, she plans to transition to the [facility]

## 2020-06-02 NOTE — Patient Instructions (Addendum)
Decrese the Saxenda to 1.2mg  once a day  We will call with lab results Schedule a mammogram F/U 3  months for Physical

## 2020-06-02 NOTE — Progress Notes (Signed)
Subjective:    Patient ID: Amy Blair, female    DOB: 05-05-1966, 54 y.o.   MRN: [id number]  Patient presents for Follow-up (is not fasting)  Obesity- she was seen by her bariatric surgeon, placced on 1100 calorie diet to help her through the platue, she had Roux N Y surgery   The diet has been challenging for her and she was placed to eat any fruits on the diet she is also has not had her snacks which is been difficult.  She has nt been able to tolerate the Saxenda 3mg  for the past 2 weeks due t nausea on the lower calories She is planning to go to [facility] clinic to have the revision He is working with exercise physical therapist prescribed by the bariatric clinic and a dietitian.  HTN- taking lisinopril HTCZ   She is taking her multivitamins- batriatic, vitamin D and B12   Migraies- she has had ot use imitrex  Constipation- linzess is working well    She is exercising regulary, trying to use some weihts with walking, bicep curls      Review Of Systems:  GEN- denies fatigue, fever, weight loss,weakness, recent illness HEENT- denies eye drainage, change in vision, nasal discharge, CVS- denies chest pain, palpitations RESP- denies SOB, cough, wheeze ABD- denies N/V, change in stools, abd pain GU- denies dysuria, hematuria, dribbling, incontinence MSK- denies joint pain, muscle aches, injury Neuro- denies headache, dizziness, syncope, seizure activity       Objective:    BP 126/68    Pulse (!) 58    Temp 98.1 F (36.7 C) (Temporal)    Resp 12    Ht 5\' 6"  (1.676 m)    Wt 245 lb (111.1 kg)    LMP 02/20/2012    SpO2 100%    BMI 39.54 kg/m  GEN- NAD, alert and oriented x3 HEENT- PERRL, EOMI, non injected sclera, pink conjunctiva, MMM, oropharynx clear Neck- Supple, no thyromegaly CVS- RRR, soft systolic murmur RESP-CTAB ABD-NABS,soft,NT,ND EXT- No edema Pulses- Radial, DP- 2+        Assessment & Plan:      Problem List Items Addressed This Visit       Unprioritized   B12 deficiency   Relevant Orders   Vitamin B12   Class 2 obesity    She will f/u with her surgeon at [facility] first next month They were planning to discuss surgical options If they do not want to proceed with intervention, she plans to transition to the [facility]       Relevant Orders   CBC with Differential/Platelet   Hemoglobin A1c   Essential hypertension    Controlled no changes Check labs today including her vitamin levels in setting of bariatric surgery Needs A1C as well To reduce nausea on the low cal diet and with Saxenda reduce, Saxenda to  1.2mg  once a day, I think she will still benefit from GLP-1 therapy      Relevant Orders   Comprehensive metabolic panel   Hemoglobin A1c   Lipid panel   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 Any transcriptional errors that result from this process are unintentional.

## 2020-06-02 NOTE — Assessment & Plan Note (Signed)
Controlled no changes Check labs today including her vitamin levels in setting of bariatric surgery Needs A1C as well To reduce nausea on the low cal diet and with Saxenda reduce, Saxenda to  1.2mg  once a day, I think she will still benefit from GLP-1 therapy

## 2020-06-03 LAB — IRON,TIBC AND FERRITIN PANEL
%SAT: 15 % (calc) — ABNORMAL LOW (ref 16–45)
Ferritin: 27 ng/mL (ref 16–232)
Iron: 60 ug/dL (ref 45–160)
TIBC: 393 mcg/dL (calc) (ref 250–450)

## 2020-06-03 LAB — COMPREHENSIVE METABOLIC PANEL
AG Ratio: 1 (calc) (ref 1.0–2.5)
ALT: 16 U/L (ref 6–29)
AST: 19 U/L (ref 10–35)
Albumin: 3.9 g/dL (ref 3.6–5.1)
Alkaline phosphatase (APISO): 99 U/L (ref 37–153)
BUN/Creatinine Ratio: 39 (calc) — ABNORMAL HIGH (ref 6–22)
BUN: 32 mg/dL — ABNORMAL HIGH (ref 7–25)
CO2: 25 mmol/L (ref 20–32)
Calcium: 9.4 mg/dL (ref 8.6–10.4)
Chloride: 108 mmol/L (ref 98–110)
Creat: 0.83 mg/dL (ref 0.50–1.05)
Globulin: 3.9 g/dL (calc) — ABNORMAL HIGH (ref 1.9–3.7)
Glucose, Bld: 73 mg/dL (ref 65–99)
Potassium: 4.6 mmol/L (ref 3.5–5.3)
Sodium: 141 mmol/L (ref 135–146)
Total Bilirubin: 0.3 mg/dL (ref 0.2–1.2)
Total Protein: 7.8 g/dL (ref 6.1–8.1)

## 2020-06-03 LAB — CBC WITH DIFFERENTIAL/PLATELET
Absolute Monocytes: 599 cells/uL (ref 200–950)
Basophils Absolute: 0 cells/uL (ref 0–200)
Basophils Relative: 0 %
Eosinophils Absolute: 69 cells/uL (ref 15–500)
Eosinophils Relative: 1.3 %
HCT: 31 % — ABNORMAL LOW (ref 35.0–45.0)
Hemoglobin: 10 g/dL — ABNORMAL LOW (ref 11.7–15.5)
Lymphs Abs: 2141 cells/uL (ref 850–3900)
MCH: 29.8 pg (ref 27.0–33.0)
MCHC: 32.3 g/dL (ref 32.0–36.0)
MCV: 92.3 fL (ref 80.0–100.0)
MPV: 10 fL (ref 7.5–12.5)
Monocytes Relative: 11.3 %
Neutro Abs: 2491 cells/uL (ref 1500–7800)
Neutrophils Relative %: 47 %
Platelets: 448 10*3/uL — ABNORMAL HIGH (ref 140–400)
RBC: 3.36 10*6/uL — ABNORMAL LOW (ref 3.80–5.10)
RDW: 12.1 % (ref 11.0–15.0)
Total Lymphocyte: 40.4 %
WBC: 5.3 10*3/uL (ref 3.8–10.8)

## 2020-06-03 LAB — LIPID PANEL
Cholesterol: 132 mg/dL (ref <200)
HDL: 47 mg/dL — ABNORMAL LOW (ref >=50)
LDL Cholesterol (Calc): 71 mg/dL (calc)
Non-HDL Cholesterol (Calc): 85 mg/dL (calc) (ref <130)
Total CHOL/HDL Ratio: 2.8 (calc) (ref <5.0)
Triglycerides: 63 mg/dL (ref <150)

## 2020-06-03 LAB — HEMOGLOBIN A1C
Hgb A1c MFr Bld: 5.4 % of total Hgb (ref <5.7)
Mean Plasma Glucose: 108 (calc)
eAG (mmol/L): 6 (calc)

## 2020-06-03 LAB — VITAMIN B12: Vitamin B-12: 1004 pg/mL (ref 200–1100)

## 2020-06-03 LAB — VITAMIN D 25 HYDROXY (VIT D DEFICIENCY, FRACTURES): Vit D, 25-Hydroxy: 23 ng/mL — ABNORMAL LOW (ref 30–100)

## 2020-06-04 ENCOUNTER — Other Ambulatory Visit: Payer: Self-pay | Admitting: *Deleted

## 2020-06-04 MED ORDER — VITAMIN D (ERGOCALCIFEROL) 1.25 MG (50000 UNIT) PO CAPS: 50000.0000 [IU] | ORAL_CAPSULE | ORAL | 0 refills | Status: DC

## 2020-06-14 ENCOUNTER — Ambulatory Visit: Payer: Self-pay

## 2020-06-24 ENCOUNTER — Telehealth: Payer: Self-pay | Admitting: *Deleted

## 2020-06-24 NOTE — Telephone Encounter (Signed)
Your information has been submitted to [employer] [place]. [employer] [place] will review the request and notify you of the determination decision directly, typically within 72 hours of receiving all information.  You will also receive your request decision electronically. To check for an update later, open this request again from your dashboard.  If [employer] [place] has not responded within the specified timeframe or if you have any questions about your PA submission, contact [employer] [place] directly at [phone].   

## 2020-06-24 NOTE — Telephone Encounter (Signed)
Received request from pharmacy for PA on Saxenda.   PA submitted.   Dx: E66.09- obesity.    03/01/2020 06/02/2020  Weight 241lbs 245lbs  BMI 28.92 39.56  Change:  +1.66%

## 2020-06-28 ENCOUNTER — Other Ambulatory Visit: Payer: Self-pay | Admitting: *Deleted

## 2020-06-28 MED ORDER — SAXENDA 18 MG/3ML ~~LOC~~ SOPN: 3.0000 mg | PEN_INJECTOR | Freq: Every day | SUBCUTANEOUS | 3 refills | Status: DC

## 2020-06-28 NOTE — Telephone Encounter (Signed)
Received PA determination.   PA [id number] approved 06/24/2020- 10/27/2020.  Pharmacy made aware.

## 2020-08-10 ENCOUNTER — Other Ambulatory Visit: Payer: Self-pay

## 2020-08-10 ENCOUNTER — Encounter: Payer: Self-pay | Admitting: Family Medicine

## 2020-08-10 ENCOUNTER — Ambulatory Visit (INDEPENDENT_AMBULATORY_CARE_PROVIDER_SITE_OTHER): Payer: BLUE CROSS/BLUE SHIELD | Admitting: Family Medicine

## 2020-08-10 VITALS — BP 126/82 | HR 58 | Temp 97.7°F | Resp 16 | Ht 66.0 in | Wt 233.0 lb

## 2020-08-10 DIAGNOSIS — M546 Pain in thoracic spine: Secondary | ICD-10-CM

## 2020-08-10 MED ORDER — CYCLOBENZAPRINE HCL 10 MG PO TABS: 10.0000 mg | ORAL_TABLET | Freq: Three times a day (TID) | ORAL | 0 refills | Status: AC | PRN

## 2020-08-10 MED ORDER — MELOXICAM 7.5 MG PO TABS: 7.5000 mg | ORAL_TABLET | Freq: Every day | ORAL | 0 refills | Status: DC

## 2020-08-10 NOTE — Progress Notes (Signed)
   Subjective:    Patient ID: Amy Blair, female    DOB: [birth year], 54 y.o.   MRN: [id number]  Patient presents for Back Pain (burning, achy pain with slight lump that moves from side to side)  Pt here thoracic back pain, mostly on left side, feels a lump near shoulder blade She has been working a lot, sitting at computer and states she doesn't have the best posture  Denies any new tingling numbness into her fingertips.  No particular injury to her back.     Review Of Systems:  GEN- denies fatigue, fever, weight loss,weakness, recent illness HEENT- denies eye drainage, change in vision, nasal discharge, CVS- denies chest pain, palpitations RESP- denies SOB, cough, wheeze ABD- denies N/V, change in stools, abd pain GU- denies dysuria, hematuria, dribbling, incontinence MSK- denies joint pain,+ muscle aches, injury Neuro- denies headache, dizziness, syncope, seizure activity       Objective:    BP 126/82   Pulse (!) 58   Temp 97.7 F (36.5 C) (Temporal)   Resp 16   Ht 5\' 6"  (1.676 m)   Wt 233 lb (105.7 kg)   LMP 02/20/2012   SpO2 98%   BMI 37.61 kg/m  GEN- NAD, alert and oriented x3 Neck- Supple, FROM C spine   CVS- RRR, no murmur RESP-CTAB MSK-  Thoracic and lumbar spine NT, + TTP lefr paraspinals, mild spasm  Good ROM spine, FROM Upper ext, Rotator cuff in tact  EXT- No edema Pulses- Radial - 2+        Assessment & Plan:      Problem List Items Addressed This Visit    None    Visit Diagnoses    Acute left-sided thoracic back pain    -  Primary   MSK spasm, given mobic and flexeril, heating pad, stretching, no red flags    Relevant Medications   cyclobenzaprine (FLEXERIL) 10 MG tablet   meloxicam (MOBIC) 7.5 MG tablet      Note: This dictation was prepared with Dragon dictation along with smaller phrase technology. Any transcriptional errors that result from this process are unintentional.

## 2020-08-10 NOTE — Patient Instructions (Signed)
F/U as previous 

## 2020-09-21 ENCOUNTER — Ambulatory Visit (INDEPENDENT_AMBULATORY_CARE_PROVIDER_SITE_OTHER): Payer: BLUE CROSS/BLUE SHIELD | Admitting: Family Medicine

## 2020-09-21 ENCOUNTER — Other Ambulatory Visit: Payer: Self-pay

## 2020-09-21 ENCOUNTER — Encounter: Payer: Self-pay | Admitting: Family Medicine

## 2020-09-21 VITALS — BP 130/78 | HR 64 | Temp 98.4°F | Resp 14 | Ht 66.0 in | Wt 228.0 lb

## 2020-09-21 DIAGNOSIS — Z23 Encounter for immunization: Secondary | ICD-10-CM | POA: Diagnosis not present

## 2020-09-21 DIAGNOSIS — D508 Other iron deficiency anemias: Secondary | ICD-10-CM

## 2020-09-21 DIAGNOSIS — E538 Deficiency of other specified B group vitamins: Secondary | ICD-10-CM | POA: Diagnosis not present

## 2020-09-21 DIAGNOSIS — I10 Essential (primary) hypertension: Secondary | ICD-10-CM

## 2020-09-21 DIAGNOSIS — E669 Obesity, unspecified: Secondary | ICD-10-CM

## 2020-09-21 DIAGNOSIS — Z9884 Bariatric surgery status: Secondary | ICD-10-CM

## 2020-09-21 DIAGNOSIS — Z124 Encounter for screening for malignant neoplasm of cervix: Secondary | ICD-10-CM

## 2020-09-21 DIAGNOSIS — E66812 Obesity, class 2: Secondary | ICD-10-CM

## 2020-09-21 DIAGNOSIS — Z0000 Encounter for general adult medical examination without abnormal findings: Secondary | ICD-10-CM

## 2020-09-21 DIAGNOSIS — Z1231 Encounter for screening mammogram for malignant neoplasm of breast: Secondary | ICD-10-CM

## 2020-09-21 DIAGNOSIS — E559 Vitamin D deficiency, unspecified: Secondary | ICD-10-CM

## 2020-09-21 DIAGNOSIS — Z0001 Encounter for general adult medical examination with abnormal findings: Secondary | ICD-10-CM

## 2020-09-21 MED ORDER — SAXENDA 18 MG/3ML ~~LOC~~ SOPN
3.0000 mg | PEN_INJECTOR | Freq: Every day | SUBCUTANEOUS | 1 refills | Status: DC
Start: 2020-09-21 — End: 2021-01-24

## 2020-09-21 NOTE — Progress Notes (Signed)
Subjective:    Patient ID: Amy Blair, female    DOB: [birth year], 54 y.o.   MRN: [id number]  Patient presents for Annual Exam (PAP Smear )  Patient here for complete physical exam.  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 ,s he had colposcopy done that shows low grade atypia, Recommended she have repeat PAP in 1 year ( 2020)  She continues to follow with Weight Management with [facility] - s/p bariatric surgery   she is also on Saxenda 3mg  once a day   weight down 5lbs since our visit in August   Vitamin D def- she has recently completed high dose weekly vitamin D currently on 2000Iu daily   HTN- taking lisinopril HCTZ without any difficulty   Immunizations- due for Flu shot , COVID 19 vaccine UTD   Colonoscopy UTD  Dentist- [facility]  [facility]- has contacts   She did feel a little lightheaded the past few days. No other symptoms, increased her water intake and food and felt better, no BP issues   Review Of Systems:  GEN- denies fatigue, fever, weight loss,weakness, recent illness HEENT- denies eye drainage, change in vision, nasal discharge, CVS- denies chest pain, palpitations RESP- denies SOB, cough, wheeze ABD- denies N/V, change in stools, abd pain GU- denies dysuria, hematuria, dribbling, incontinence MSK- denies joint pain, muscle aches, injury Neuro- denies headache, dizziness, syncope, seizure activity       Objective:    BP 130/78   Pulse 64   Temp 98.4 F (36.9 C) (Temporal)   Resp 14   Ht 5\' 6"  (1.676 m)   Wt 228 lb (103.4 kg)   LMP 02/20/2012   SpO2 99%   BMI 36.80 kg/m  GEN- NAD, alert and oriented x3 HEENT- PERRL, EOMI, non injected sclera, pink conjunctiva, MMM, oropharynx clear Neck- Supple, no thyromegaly Breast- normal symmetry, no nipple inversion,no nipple drainage, no nodules or lumps felt Nodes- no axillary nodes CVS- RRR, no  murmur RESP-CTAB ABD-NABS,soft,NT,ND GU- normal external genitalia, vaginal mucosa pink and moist, cervix visualized no growth, no blood form os, minimal thin clear discharge, no CMT, no ovarian masses, uterus normal size Psych- normal affect and mood  EXT- No edema Pulses- Radial, DP- 2+  FALL/DEPRESSION/AUDIT C negative       Assessment & Plan:      Problem List Items Addressed This Visit      Unprioritized   B12 deficiency   Relevant Orders   Vitamin B12   Class 2 obesity   Relevant Orders   Lipid panel   Essential hypertension    Controlled no changes, fasting labs obtained       Relevant Orders   CBC with Differential/Platelet   Comprehensive metabolic panel   Lipid panel   Gastric bypass status for obesity   Iron deficiency anemia    Recheck iron/ferritin On replacement Vitamin deficiency secondary to bariatric surgery and absorption issues associated       Relevant Orders   Iron, TIBC and Ferritin Panel    Other Visit Diagnoses    Routine general medical examination at a health care facility    -  Primary   CPE done, PAP smear obtained, history of abnormal PAP, no post menopausal bleeding   Relevant Orders   CBC with Differential/Platelet   Comprehensive metabolic panel   Cervical cancer screening       Relevant Orders  PAP,TP IMGw/HPV RNA,rflx [id number],79/48   Encounter for screening mammogram for malignant neoplasm of breast       pt to schedule mammo   Relevant Orders   MM 3D SCREEN BREAST BILATERAL   Vitamin D deficiency       Relevant Orders   Vitamin D, 25-hydroxy       Note: This dictation was prepared with Dragon dictation along with smaller phrase technology. Any transcriptional errors that result from this process are unintentional.

## 2020-09-21 NOTE — Assessment & Plan Note (Signed)
Continue saxenda Follows with dietician and bariatric center

## 2020-09-21 NOTE — Assessment & Plan Note (Signed)
>>  ASSESSMENT AND PLAN FOR CLASS 2 OBESITY WRITTEN ON 09/21/2020  9:00 AM BY Milinda Antis F  Continue saxenda Follows with dietician and bariatric center

## 2020-09-21 NOTE — Assessment & Plan Note (Signed)
Controlled no changes, fasting labs obtained

## 2020-09-21 NOTE — Patient Instructions (Signed)
Schedule your mammogram We will call or mychart with lab results Flu shot given  F/U 4 months

## 2020-09-21 NOTE — Assessment & Plan Note (Signed)
Recheck iron/ferritin On replacement Vitamin deficiency secondary to bariatric surgery and absorption issues associated

## 2020-09-22 LAB — CBC WITH DIFFERENTIAL/PLATELET
Absolute Monocytes: 470 cells/uL (ref 200–950)
Basophils Absolute: 0 cells/uL (ref 0–200)
Basophils Relative: 0 %
Eosinophils Absolute: 19 cells/uL (ref 15–500)
Eosinophils Relative: 0.4 %
HCT: 32 % — ABNORMAL LOW (ref 35.0–45.0)
Hemoglobin: 10.5 g/dL — ABNORMAL LOW (ref 11.7–15.5)
Lymphs Abs: 1875 cells/uL (ref 850–3900)
MCH: 29.6 pg (ref 27.0–33.0)
MCHC: 32.8 g/dL (ref 32.0–36.0)
MCV: 90.1 fL (ref 80.0–100.0)
MPV: 10 fL (ref 7.5–12.5)
Monocytes Relative: 10 %
Neutro Abs: 2336 cells/uL (ref 1500–7800)
Neutrophils Relative %: 49.7 %
Platelets: 432 10*3/uL — ABNORMAL HIGH (ref 140–400)
RBC: 3.55 10*6/uL — ABNORMAL LOW (ref 3.80–5.10)
RDW: 14.2 % (ref 11.0–15.0)
Total Lymphocyte: 39.9 %
WBC: 4.7 10*3/uL (ref 3.8–10.8)

## 2020-09-22 LAB — IRON,TIBC AND FERRITIN PANEL
%SAT: 18 % (calc) (ref 16–45)
Ferritin: 68 ng/mL (ref 16–232)
Iron: 69 ug/dL (ref 45–160)
TIBC: 394 mcg/dL (calc) (ref 250–450)

## 2020-09-22 LAB — COMPREHENSIVE METABOLIC PANEL
AG Ratio: 1.1 (calc) (ref 1.0–2.5)
ALT: 16 U/L (ref 6–29)
AST: 18 U/L (ref 10–35)
Albumin: 4.2 g/dL (ref 3.6–5.1)
Alkaline phosphatase (APISO): 109 U/L (ref 37–153)
BUN/Creatinine Ratio: 26 (calc) — ABNORMAL HIGH (ref 6–22)
BUN: 27 mg/dL — ABNORMAL HIGH (ref 7–25)
CO2: 27 mmol/L (ref 20–32)
Calcium: 9.6 mg/dL (ref 8.6–10.4)
Chloride: 101 mmol/L (ref 98–110)
Creat: 1.02 mg/dL (ref 0.50–1.05)
Globulin: 3.9 g/dL (calc) — ABNORMAL HIGH (ref 1.9–3.7)
Glucose, Bld: 65 mg/dL (ref 65–99)
Potassium: 4.4 mmol/L (ref 3.5–5.3)
Sodium: 138 mmol/L (ref 135–146)
Total Bilirubin: 0.4 mg/dL (ref 0.2–1.2)
Total Protein: 8.1 g/dL (ref 6.1–8.1)

## 2020-09-22 LAB — VITAMIN D 25 HYDROXY (VIT D DEFICIENCY, FRACTURES): Vit D, 25-Hydroxy: 36 ng/mL (ref 30–100)

## 2020-09-22 LAB — LIPID PANEL
Cholesterol: 146 mg/dL (ref <200)
HDL: 56 mg/dL (ref >=50)
LDL Cholesterol (Calc): 76 mg/dL (calc)
Non-HDL Cholesterol (Calc): 90 mg/dL (calc) (ref <130)
Total CHOL/HDL Ratio: 2.6 (calc) (ref <5.0)
Triglycerides: 64 mg/dL (ref <150)

## 2020-09-22 LAB — VITAMIN B12: Vitamin B-12: 1763 pg/mL — ABNORMAL HIGH (ref 200–1100)

## 2020-09-23 LAB — PAP, TP IMAGING W/ HPV RNA, RFLX HPV TYPE 16,18/45: HPV DNA High Risk: NOT DETECTED

## 2020-10-08 ENCOUNTER — Ambulatory Visit: Payer: BLUE CROSS/BLUE SHIELD

## 2020-10-18 ENCOUNTER — Telehealth: Payer: Self-pay | Admitting: *Deleted

## 2020-10-18 NOTE — Telephone Encounter (Signed)
Received request from pharmacy for PA on Saxenda.   PA submitted.   Dx: E66.09- obesity.    03/01/2020 09/21/2020  Weight 241lbs 228 lbs  BMI 28.92 36.82  Change:  -5.39%     Your information has been submitted to [facility]. [employer] Cross [place] will review the request and notify you of the determination decision directly, typically within 72 hours of receiving all information.  You will also receive your request decision electronically. To check for an update later, open this request again from your dashboard.  If [employer] [place] has not responded within the specified timeframe or if you have any questions about your PA submission, contact [employer] Cross [place] directly at [phone].

## 2020-10-20 NOTE — Telephone Encounter (Signed)
Received PA determination.   PA approved effective from 10/18/2020 through 10/17/2021.

## 2020-10-28 ENCOUNTER — Ambulatory Visit: Payer: BLUE CROSS/BLUE SHIELD

## 2020-10-28 ENCOUNTER — Other Ambulatory Visit: Payer: Self-pay

## 2021-01-24 ENCOUNTER — Ambulatory Visit (INDEPENDENT_AMBULATORY_CARE_PROVIDER_SITE_OTHER): Payer: Self-pay | Admitting: Family Medicine

## 2021-01-24 ENCOUNTER — Other Ambulatory Visit: Payer: Self-pay

## 2021-01-24 ENCOUNTER — Encounter: Payer: Self-pay | Admitting: Family Medicine

## 2021-01-24 VITALS — BP 134/68 | HR 84 | Temp 98.7°F | Resp 14 | Ht 66.0 in | Wt 237.0 lb

## 2021-01-24 DIAGNOSIS — Z9884 Bariatric surgery status: Secondary | ICD-10-CM

## 2021-01-24 DIAGNOSIS — D508 Other iron deficiency anemias: Secondary | ICD-10-CM

## 2021-01-24 DIAGNOSIS — I10 Essential (primary) hypertension: Secondary | ICD-10-CM

## 2021-01-24 DIAGNOSIS — E669 Obesity, unspecified: Secondary | ICD-10-CM

## 2021-01-24 DIAGNOSIS — G43709 Chronic migraine without aura, not intractable, without status migrainosus: Secondary | ICD-10-CM

## 2021-01-24 DIAGNOSIS — E559 Vitamin D deficiency, unspecified: Secondary | ICD-10-CM

## 2021-01-24 DIAGNOSIS — K5900 Constipation, unspecified: Secondary | ICD-10-CM

## 2021-01-24 MED ORDER — LINACLOTIDE 145 MCG PO CAPS: 145.0000 ug | ORAL_CAPSULE | Freq: Every day | ORAL | 1 refills | Status: AC

## 2021-01-24 MED ORDER — MELOXICAM 7.5 MG PO TABS: 7.5000 mg | ORAL_TABLET | Freq: Every day | ORAL | 1 refills | Status: DC

## 2021-01-24 MED ORDER — MULTI-VITAMIN/MINERALS PO TABS: ORAL_TABLET | ORAL | 3 refills | Status: AC

## 2021-01-24 MED ORDER — LISINOPRIL-HYDROCHLOROTHIAZIDE 20-25 MG PO TABS
1.0000 | ORAL_TABLET | Freq: Every day | ORAL | 2 refills | Status: DC
Start: 2021-01-24 — End: 2021-08-11

## 2021-01-24 MED ORDER — VALACYCLOVIR HCL 500 MG PO TABS
500.0000 mg | ORAL_TABLET | Freq: Every day | ORAL | 1 refills | Status: AC
Start: 2021-01-24 — End: ?

## 2021-01-24 MED ORDER — SUMATRIPTAN SUCCINATE 100 MG PO TABS: ORAL_TABLET | ORAL | 0 refills | Status: AC

## 2021-01-24 MED ORDER — SAXENDA 18 MG/3ML ~~LOC~~ SOPN: 3.0000 mg | PEN_INJECTOR | Freq: Every day | SUBCUTANEOUS | 1 refills | Status: DC

## 2021-01-24 NOTE — Assessment & Plan Note (Signed)
Continue with saxenda Weight gain of 9lbs

## 2021-01-24 NOTE — Assessment & Plan Note (Signed)
Well controlled no changes to meds 

## 2021-01-24 NOTE — Assessment & Plan Note (Signed)
Prn linzess

## 2021-01-24 NOTE — Assessment & Plan Note (Signed)
Continue with vitamin supplements Follows with dietician at [facility] Bariatric clinic

## 2021-01-24 NOTE — Assessment & Plan Note (Signed)
>>  ASSESSMENT AND PLAN FOR CLASS 2 OBESITY WRITTEN ON 01/24/2021  8:53 AM BY [place], KAWANTA F  Continue with saxenda Weight gain of 9lbs

## 2021-01-24 NOTE — Progress Notes (Signed)
   Subjective:    Patient ID: Amy Blair, female    DOB: 1966-05-02, 55 y.o.   MRN: [id number]  Patient presents for Follow-up (Is not fasting)  Patient here to follow-up chronic medical problems.  Medications reviewed.  He is due for fasting labs.  Hypertension she is taking lisinopril HCTZ without any difficulty blood pressures have been controlled.  Class II obesity she is on Saxenda 3 mg once a day.  She still following up with her dietitian from her bariatric surgery clinic. She is taking her supplements including iron, vitamin D and B1 VIA batriatic vitamin  Weight up 9lbs  She has not been working out. She is planning to work with a Psychologist, educational starting this month.   History of migraine she has Imitrex on hand.    Review Of Systems:  GEN- denies fatigue, fever, weight loss,weakness, recent illness HEENT- denies eye drainage, change in vision, nasal discharge, CVS- denies chest pain, palpitations RESP- denies SOB, cough, wheeze ABD- denies N/V, change in stools, abd pain GU- denies dysuria, hematuria, dribbling, incontinence MSK- denies joint pain, muscle aches, injury Neuro- denies headache, dizziness, syncope, seizure activity       Objective:    BP 134/68   Pulse 84   Temp 98.7 F (37.1 C) (Temporal)   Resp 14   Ht 5\' 6"  (1.676 m)   Wt 237 lb (107.5 kg)   LMP 02/20/2012   SpO2 97%   BMI 38.25 kg/m  GEN- NAD, alert and oriented x3 HEENT- PERRL, EOMI, non injected sclera, pink conjunctiva, MMM, oropharynx clear Neck- Supple, no thyromegaly CVS- RRR, no murmur RESP-CTAB ABD-NABS,soft,NT,ND EXT- No edema Pulses- Radial, DP- 2+        Assessment & Plan:      Problem List Items Addressed This Visit      Unprioritized   Class 2 obesity    Continue with saxenda Weight gain of 9lbs       Constipation    Prn linzess       Essential hypertension - Primary    Well controlled no changes to meds       Relevant Medications    lisinopril-hydrochlorothiazide (ZESTORETIC) 20-25 MG tablet   Other Relevant Orders   CBC with Differential/Platelet   Comprehensive metabolic panel   Gastric bypass status for obesity    Continue with vitamin supplements Follows with dietician [facility]      Relevant Medications   Liraglutide -Weight Management (SAXENDA) 18 MG/3ML SOPN   Iron deficiency anemia   Relevant Orders   Iron, TIBC and Ferritin Panel   Migraine headache    Prn imitrex, rare use       Relevant Medications   lisinopril-hydrochlorothiazide (ZESTORETIC) 20-25 MG tablet   SUMAtriptan (IMITREX) 100 MG tablet   meloxicam (MOBIC) 7.5 MG tablet    Other Visit Diagnoses    Vitamin D deficiency       Relevant Orders   Vitamin D, 25-hydroxy      Note: This dictation was prepared with Dragon dictation along with smaller phrase technology. Any transcriptional errors that result from this process are unintentional.

## 2021-01-24 NOTE — Assessment & Plan Note (Signed)
Prn imitrex, rare use

## 2021-01-24 NOTE — Patient Instructions (Addendum)
We will call with lab results  Continue Saxenda

## 2021-01-25 LAB — CBC WITH DIFFERENTIAL/PLATELET
Absolute Monocytes: 570 {cells}/uL (ref 200–950)
Basophils Absolute: 10 {cells}/uL (ref 0–200)
Basophils Relative: 0.2 %
Eosinophils Absolute: 70 {cells}/uL (ref 15–500)
Eosinophils Relative: 1.4 %
HCT: 32.3 % — ABNORMAL LOW (ref 35.0–45.0)
Hemoglobin: 10.5 g/dL — ABNORMAL LOW (ref 11.7–15.5)
Lymphs Abs: 2035 {cells}/uL (ref 850–3900)
MCH: 29.9 pg (ref 27.0–33.0)
MCHC: 32.5 g/dL (ref 32.0–36.0)
MCV: 92 fL (ref 80.0–100.0)
MPV: 10.1 fL (ref 7.5–12.5)
Monocytes Relative: 11.4 %
Neutro Abs: 2315 {cells}/uL (ref 1500–7800)
Neutrophils Relative %: 46.3 %
Platelets: 426 10*3/uL — ABNORMAL HIGH (ref 140–400)
RBC: 3.51 Million/uL — ABNORMAL LOW (ref 3.80–5.10)
RDW: 12.3 % (ref 11.0–15.0)
Total Lymphocyte: 40.7 %
WBC: 5 10*3/uL (ref 3.8–10.8)

## 2021-01-25 LAB — COMPREHENSIVE METABOLIC PANEL
AG Ratio: 1.1 (calc) (ref 1.0–2.5)
ALT: 15 U/L (ref 6–29)
AST: 20 U/L (ref 10–35)
Albumin: 4.1 g/dL (ref 3.6–5.1)
Alkaline phosphatase (APISO): 108 U/L (ref 37–153)
BUN: 22 mg/dL (ref 7–25)
CO2: 26 mmol/L (ref 20–32)
Calcium: 9.6 mg/dL (ref 8.6–10.4)
Chloride: 107 mmol/L (ref 98–110)
Creat: 0.89 mg/dL (ref 0.50–1.05)
Globulin: 3.9 g/dL (calc) — ABNORMAL HIGH (ref 1.9–3.7)
Glucose, Bld: 55 mg/dL — ABNORMAL LOW (ref 65–99)
Potassium: 4.6 mmol/L (ref 3.5–5.3)
Sodium: 141 mmol/L (ref 135–146)
Total Bilirubin: 0.3 mg/dL (ref 0.2–1.2)
Total Protein: 8 g/dL (ref 6.1–8.1)

## 2021-01-25 LAB — IRON,TIBC AND FERRITIN PANEL
%SAT: 13 % (calc) — ABNORMAL LOW (ref 16–45)
Ferritin: 20 ng/mL (ref 16–232)
Iron: 54 ug/dL (ref 45–160)
TIBC: 425 mcg/dL (calc) (ref 250–450)

## 2021-01-25 LAB — VITAMIN D 25 HYDROXY (VIT D DEFICIENCY, FRACTURES): Vit D, 25-Hydroxy: 24 ng/mL — ABNORMAL LOW (ref 30–100)

## 2021-08-11 ENCOUNTER — Other Ambulatory Visit: Payer: Self-pay

## 2021-08-11 ENCOUNTER — Ambulatory Visit (INDEPENDENT_AMBULATORY_CARE_PROVIDER_SITE_OTHER): Payer: Self-pay | Admitting: Nurse Practitioner

## 2021-08-11 ENCOUNTER — Encounter: Payer: Self-pay | Admitting: Nurse Practitioner

## 2021-08-11 VITALS — BP 142/80 | HR 89 | Temp 98.4°F | Ht 63.4 in | Wt 242.8 lb

## 2021-08-11 DIAGNOSIS — I10 Essential (primary) hypertension: Secondary | ICD-10-CM

## 2021-08-11 DIAGNOSIS — R7309 Other abnormal glucose: Secondary | ICD-10-CM

## 2021-08-11 DIAGNOSIS — D508 Other iron deficiency anemias: Secondary | ICD-10-CM

## 2021-08-11 DIAGNOSIS — Z1231 Encounter for screening mammogram for malignant neoplasm of breast: Secondary | ICD-10-CM

## 2021-08-11 DIAGNOSIS — Z7689 Persons encountering health services in other specified circumstances: Secondary | ICD-10-CM

## 2021-08-11 DIAGNOSIS — E538 Deficiency of other specified B group vitamins: Secondary | ICD-10-CM

## 2021-08-11 DIAGNOSIS — Z9884 Bariatric surgery status: Secondary | ICD-10-CM

## 2021-08-11 DIAGNOSIS — Z6841 Body Mass Index (BMI) 40.0 and over, adult: Secondary | ICD-10-CM

## 2021-08-11 LAB — POCT URINALYSIS DIPSTICK
Bilirubin, UA: NEGATIVE
Blood, UA: NEGATIVE
Glucose, UA: NEGATIVE
Ketones, UA: NEGATIVE
Leukocytes, UA: NEGATIVE
Nitrite, UA: NEGATIVE
Protein, UA: NEGATIVE
Spec Grav, UA: 1.02 (ref 1.010–1.025)
Urobilinogen, UA: 0.2 E.U./dL
pH, UA: 5.5 (ref 5.0–8.0)

## 2021-08-11 LAB — POCT UA - MICROALBUMIN
Albumin/Creatinine Ratio, Urine, POC: <30
Creatinine, POC: 200 mg/dL
Microalbumin Ur, POC: 30 mg/L

## 2021-08-11 MED ORDER — SAXENDA 18 MG/3ML ~~LOC~~ SOPN: 3.0000 mg | PEN_INJECTOR | Freq: Every day | SUBCUTANEOUS | 1 refills | Status: DC

## 2021-08-11 MED ORDER — LISINOPRIL-HYDROCHLOROTHIAZIDE 20-25 MG PO TABS: 1.0000 | ORAL_TABLET | Freq: Every day | ORAL | 2 refills | Status: DC

## 2021-08-11 NOTE — Patient Instructions (Signed)
Cooking With Less Salt Cooking with less salt is one way to reduce the amount of sodium you get from food. Sodium is one of the elements that make up salt. It is found naturally in foods and is also added to certain foods. Depending on your condition and overall health, your health care provider or dietitian may recommend that you reduce your sodium intake. Most people should have less than 2,300 milligrams (mg) of sodium each day. If you have high blood pressure (hypertension), you may need to limit your sodium to 1,500 mg each day. Follow the tipsbelow to help reduce your sodium intake. What are tips for eating less sodium? Reading food labels  Check the food label before buying or using packaged ingredients. Always check the label for the serving size and sodium content. Look for products with no more than 140 mg of sodium in one serving. Check the % Daily Value column to see what percent of the daily recommended amount of sodium is provided in one serving of the product. Foods with 5% or less in this column are considered low in sodium. Foods with 20% or higher are considered high in sodium. Do not choose foods with salt as one of the first three ingredients on the ingredients list. If salt is one of the first three ingredients, it usually means the item is high in sodium.  Shopping Buy sodium-free or low-sodium products. Look for the following words on food labels: Low-sodium. Sodium-free. Reduced-sodium. No salt added. Unsalted. Always check the sodium content even if foods are labeled as low-sodium or no salt added. Buy fresh foods. Cooking Use herbs, seasonings without salt, and spices as substitutes for salt. Use sodium-free baking soda when baking. Grill, braise, or roast foods to add flavor with less salt. Avoid adding salt to pasta, rice, or hot cereals. Drain and rinse canned vegetables, beans, and meat before use. Avoid adding salt when cooking sweets and desserts. Cook with  low-sodium ingredients. What foods are high in sodium? Vegetables Regular canned vegetables (not low-sodium or reduced-sodium). Sauerkraut, pickled vegetables, and relishes. Olives. French fries. Onion rings. Regular canned tomato sauce and paste. Regular tomato and vegetable juice. Frozenvegetables in sauces. Grains Instant hot cereals. Bread stuffing, pancake, and biscuit mixes. Croutons. Seasoned rice or pasta mixes. Noodle soup cups. Boxed or frozen macaroni and cheese. Regular salted crackers. Self-rising flour. Rolls. Bagels. Flourtortillas and wraps. Meats and other proteins Meat or fish that is salted, canned, smoked, cured, spiced, or pickled. This includes bacon, ham, sausages, hot dogs, corned beef, chipped beef, meat loaves, salt pork, jerky, pickled herring, anchovies, regular canned tuna, andsardines. Salted nuts. Dairy Processed cheese and cheese spreads. Cheese curds. Blue cheese. Feta cheese.String cheese. Regular cottage cheese. Buttermilk. Canned milk. The items listed above may not be a complete list of foods high in sodium. Actual amounts of sodium may be different depending on processing. Contact a dietitian for more information. What foods are low in sodium? Fruits Fresh, frozen, or canned fruit with no sauce added. Fruit juice. Vegetables Fresh or frozen vegetables with no sauce added. "No salt added" canned vegetables. "No salt added" tomato sauce and paste. Low-sodium orreduced-sodium tomato and vegetable juice. Grains Noodles, pasta, quinoa, rice. Shredded or puffed wheat or puffed rice. Regular or quick oats (not instant). Low-sodium crackers. Low-sodium bread. Whole-grainbread and whole-grain pasta. Unsalted popcorn. Meats and other proteins Fresh or frozen whole meats, poultry (not injected with sodium), and fish with no sauce added. Unsalted nuts. Dried peas, beans, and   lentils without added salt. Unsalted canned beans. Eggs. Unsalted nut butters. Low-sodium canned  tunaor chicken. Dairy Milk. Soy milk. Yogurt. Low-sodium cheeses, such as Swiss, Monterey Jack, mozzarella, and ricotta. Sherbet or ice cream (keep to  cup per serving).Cream cheese. Fats and oils Unsalted butter or margarine. Other foods Homemade pudding. Sodium-free baking soda and baking powder. Herbs and spices.Low-sodium seasoning mixes. Beverages Coffee and tea. Carbonated beverages. The items listed above may not be a complete list of foods low in sodium. Actual amounts of sodium may be different depending on processing. Contact a dietitian for more information. What are some salt alternatives when cooking? The following are herbs, seasonings, and spices that can be used instead of salt to flavor your food. Herbs should be fresh or dried. Do not choose packaged mixes. Next to the name of the herb, spice, or seasoning aresome examples of foods you can pair it with. Herbs [place] leaves - Soups, meat and vegetable dishes, and spaghetti sauce. Basil - Italian dishes, soups, pasta, and fish dishes. Cilantro - Meat, poultry, and vegetable dishes. Chili powder - Marinades and Mexican dishes. Chives - Salad dressings and potato dishes. Cumin - Mexican dishes, couscous, and meat dishes. Dill - Fish dishes, sauces, and salads. Fennel - Meat and vegetable dishes, breads, and cookies. Garlic (do not use garlic salt) - Italian dishes, meat dishes, salad dressings, and sauces. Marjoram - Soups, potato dishes, and meat dishes. Oregano - Pizza and spaghetti sauce. Parsley - Salads, soups, pasta, and meat dishes. Rosemary - Italian dishes, salad dressings, soups, and red meats. Saffron - Fish dishes, pasta, and some poultry dishes. Sage - Stuffings and sauces. Tarragon - Fish and poultry dishes. Thyme - Stuffing, meat, and fish dishes. Seasonings Lemon juice - Fish dishes, poultry dishes, vegetables, and salads. Vinegar - Salad dressings, vegetables, and fish dishes. Spices Cinnamon - Sweet  dishes, such as cakes, cookies, and puddings. Cloves - Gingerbread, puddings, and marinades for meats. Curry - Vegetable dishes, fish and poultry dishes, and stir-fry dishes. Ginger - Vegetable dishes, fish dishes, and stir-fry dishes. Nutmeg - Pasta, vegetables, poultry, fish dishes, and custard. Summary Cooking with less salt is one way to reduce the amount of sodium that you get from food. Buy sodium-free or low-sodium products. Check the food label before using or buying packaged ingredients. Use herbs, seasonings without salt, and spices as substitutes for salt in foods. This information is not intended to replace advice given to you by your health care provider. Make sure you discuss any questions you have with your healthcare provider. Document Revised: 11/26/2019 Document Reviewed: 11/26/2019 Elsevier Patient Education  2022 Elsevier Inc.  

## 2021-08-11 NOTE — Progress Notes (Signed)
I,Katawbba Wiggins,acting as a Education administrator for [employer], FNP.,have documented all relevant documentation on the behalf of [name], FNP,as directed by  [name], FNP while in the presence of [name], [place].   This visit occurred during the SARS-CoV-2 public health emergency.  Safety protocols were in place, including screening questions prior to the visit, additional usage of staff PPE, and extensive cleaning of exam room while observing appropriate contact time as indicated for disinfecting solutions.  Subjective:     Patient ID: Amy Blair , female    DOB: 08-31-1966 , 55 y.o.   MRN: [id number]   Chief Complaint  Patient presents with   Establish Care    HPI  The patient is here today to establish care, she had been seeing Dr. Buelah Manis as her PCP and was managing her weight loss. She was referred by her friend L.H. 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 Single. She had a daughter who passed away 4 years ago had shingles that affected her heart. She was having her PAPs. She had right knee surgery. She had gastric bypass in 2010 was up to 395 lbs her diabetes improved but still has hypertension. She has been on Saxenda - she has gained 15 lbs in the last year. She is exercising and drinking her water.   She has been seen by Cardiology for her heart murmur.  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   She drank a Hotel manager prior to coming to the office. She did take her medications this morning.   Wt Readings from Last 3 Encounters: 08/11/21 : 242 lb 12.8 oz (110.1 kg) 01/24/21 : 237 lb (107.5 kg) 09/21/20 : 228 lb (103.4 kg)      Past Medical History:  Diagnosis Date   Anemia    HX   Arthritis    knees-a little   ASCUS with positive high risk HPV cervical 03/2018   Colposcopy inadequate but negative.  ECC showed LGSIL.   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    Hypertension      Family History  Problem Relation Age of Onset   Arthritis Mother    Cancer Mother        Cervix   Diabetes Mother    Hyperlipidemia Mother    Hypertension Mother    Diabetes Father    Heart disease Father    Cancer Sister        cervical   Diabetes Sister    Hypertension Sister    Diabetes Sister    Hypertension Sister    Breast cancer Maternal Aunt    Anesthesia problems Neg Hx      Current Outpatient Medications:    cetirizine (ZYRTEC) 10 MG tablet, Take 1 tablet (10 mg total) by mouth daily., Disp: 30 tablet, Rfl: 3   Cholecalciferol (VITAMIN D) 50 MCG (2000 UT) CAPS, Take by mouth., Disp: , Rfl:    cyclobenzaprine (FLEXERIL) 10 MG tablet, Take 1 tablet (10 mg total) by mouth 3 (three) times daily as needed for muscle spasms., Disp: 20 tablet, Rfl: 0   ibuprofen (ADVIL) 800 MG tablet, Take 1 tablet (800 mg total) by mouth 3 (three) times daily. Do NOT take on an empty stomach.  Drink plenty of water with this medication, Disp: 21 tablet, Rfl: 0   Lifitegrast (XIIDRA) 5 %  SOLN, Apply to eye., Disp: , Rfl:    linaclotide (LINZESS) 145 MCG CAPS capsule, Take 1 capsule (145 mcg total) by mouth daily., Disp: 90 capsule, Rfl: 1   meloxicam (MOBIC) 7.5 MG tablet, Take 1 tablet (7.5 mg total) by mouth daily., Disp: 30 tablet, Rfl: 1   Multiple Vitamins-Minerals (MULTIVITAMIN WITH MINERALS) tablet, Bariatric vitamin, Disp: 30 tablet, Rfl: 3   promethazine (PHENERGAN) 25 MG tablet, Take 1 tablet (25 mg total) by mouth every 8 (eight) hours as needed for nausea or vomiting., Disp: 45 tablet, Rfl: 2   SUMAtriptan (IMITREX) 100 MG tablet, TAKE ONE TABLET BY MOUTH AT ONSET OF HEADACHE, THEN REPEAT IN 2 HOURS, Disp: 10 tablet, Rfl: 0   valACYclovir (VALTREX) 500 MG tablet, Take 1 tablet (500 mg total) by mouth daily., Disp: 30 tablet, Rfl: 1   Liraglutide -Weight Management (SAXENDA) 18 MG/3ML SOPN, Inject 3 mg into the skin daily., Disp: 9 mL, Rfl: 1    lisinopril-hydrochlorothiazide (ZESTORETIC) 20-25 MG tablet, Take 1 tablet by mouth daily., Disp: 90 tablet, Rfl: 2   Allergies  Allergen Reactions   Sulfa Antibiotics Hives     Review of Systems  Constitutional: Negative.   Respiratory: Negative.    Cardiovascular: Negative.   Gastrointestinal: Negative.   Psychiatric/Behavioral: Negative.    All other systems reviewed and are negative.   Today's Vitals   08/11/21 1155  BP: (!) 142/80  Pulse: 89  Temp: 98.4 F (36.9 C)  TempSrc: Oral  Weight: 242 lb 12.8 oz (110.1 kg)  Height: 5' 3.4" (1.61 m)   Body mass index is 42.47 kg/m.  Wt Readings from Last 3 Encounters:  08/11/21 242 lb 12.8 oz (110.1 kg)  01/24/21 237 lb (107.5 kg)  09/21/20 228 lb (103.4 kg)    BP Readings from Last 3 Encounters:  08/11/21 (!) 142/80  01/24/21 134/68  09/21/20 130/78    Objective:  Physical Exam Vitals reviewed.  Constitutional:      Appearance: She is well-developed.  Eyes:     Pupils: Pupils are equal, round, and reactive to light.  Cardiovascular:     Rate and Rhythm: Normal rate and regular rhythm.     Pulses: Normal pulses.     Heart sounds: Normal heart sounds. No murmur heard. Pulmonary:     Effort: Pulmonary effort is normal. No respiratory distress.     Breath sounds: Normal breath sounds. No wheezing.  Musculoskeletal:        General: Normal range of motion.  Skin:    General: Skin is warm and dry.     Capillary Refill: Capillary refill takes less than 2 seconds.  Neurological:     General: No focal deficit present.     Mental Status: She is alert and oriented to person, place, and time.     Cranial Nerves: No cranial nerve deficit.  Psychiatric:        Mood and Affect: Mood normal.        Behavior: Behavior normal.        Thought Content: Thought content normal.        Judgment: Judgment normal.        Assessment And Plan:     1. Essential hypertension Comments: Blood pressure is fairly controlled Continue  current medications EKG done with NSR HR 61 - POCT Urinalysis Dipstick (81002) - POCT UA - Microalbumin - EKG 12-Lead - CBC - CMP14+EGFR  2. Other iron deficiency anemia Comments: Will check iron levels Likely related to  her gastric bypass - Iron, TIBC and Ferritin Panel  3. Gastric bypass status for obesity Comments: Done approximately 10 years ago  4. B12 deficiency Comments: Likely related to her gastric bypass  5. Abnormal glucose No current medications - Hemoglobin A1c  6. Encounter for screening mammogram for malignant neoplasm of breast Pt instructed on Self Breast Exam.According to ACOG guidelines Women aged 68 and older are recommended to get an annual mammogram. Form completed and given to patient contact the [facility] for appointment scheduing.  Pt encouraged to get annual mammogram - MM 3D SCREEN BREAST BILATERAL; Future  7. Class 3 severe obesity due to excess calories with serious comorbidity and body mass index (BMI) of 40.0 to 44.9 in adult [facility]) Will start her on Saxenda, discussed sided effects and no family history of thyroid cancer or personal history of pancreatitis - Liraglutide -Weight Management (SAXENDA) 18 MG/3ML SOPN; Inject 3 mg into the skin daily.  Dispense: 9 mL; Refill: 1  8. Encounter to establish care    Patient was given opportunity to ask questions. Patient verbalized understanding of the plan and was able to repeat key elements of the plan. All questions were answered to their satisfaction.  [name], FNP   I, [name], FNP, have reviewed all documentation for this visit. The documentation on 08/11/21 for the exam, diagnosis, procedures, and orders are all accurate and complete.   IF YOU HAVE BEEN REFERRED TO A SPECIALIST, IT MAY TAKE 1-2 WEEKS TO SCHEDULE/PROCESS THE REFERRAL. IF YOU HAVE NOT HEARD FROM US/SPECIALIST IN TWO WEEKS, PLEASE GIVE [place] A CALL AT [phone].   THE PATIENT IS ENCOURAGED TO PRACTICE SOCIAL  DISTANCING DUE TO THE COVID-19 PANDEMIC.

## 2021-08-12 LAB — CBC
Hematocrit: 31.8 % — ABNORMAL LOW (ref 34.0–46.6)
Hemoglobin: 10.2 g/dL — ABNORMAL LOW (ref 11.1–15.9)
MCH: 28.8 pg (ref 26.6–33.0)
MCHC: 32.1 g/dL (ref 31.5–35.7)
MCV: 90 fL (ref 79–97)
Platelets: 409 10*3/uL (ref 150–450)
RBC: 3.54 x10E6/uL — ABNORMAL LOW (ref 3.77–5.28)
RDW: 12.8 % (ref 11.7–15.4)
WBC: 6.1 10*3/uL (ref 3.4–10.8)

## 2021-08-12 LAB — CMP14+EGFR
ALT: 18 IU/L (ref 0–32)
AST: 25 IU/L (ref 0–40)
Albumin/Globulin Ratio: 1.3 (ref 1.2–2.2)
Albumin: 4.4 g/dL (ref 3.8–4.9)
Alkaline Phosphatase: 130 IU/L — ABNORMAL HIGH (ref 44–121)
BUN/Creatinine Ratio: 24 — ABNORMAL HIGH (ref 9–23)
BUN: 22 mg/dL (ref 6–24)
Bilirubin Total: <0.2 mg/dL (ref 0.0–1.2)
CO2: 23 mmol/L (ref 20–29)
Calcium: 9.4 mg/dL (ref 8.7–10.2)
Chloride: 102 mmol/L (ref 96–106)
Creatinine, Ser: 0.93 mg/dL (ref 0.57–1.00)
Globulin, Total: 3.5 g/dL (ref 1.5–4.5)
Glucose: 72 mg/dL (ref 65–99)
Potassium: 4.6 mmol/L (ref 3.5–5.2)
Sodium: 140 mmol/L (ref 134–144)
Total Protein: 7.9 g/dL (ref 6.0–8.5)
eGFR: 72 mL/min/{1.73_m2} (ref >59)

## 2021-08-12 LAB — IRON,TIBC AND FERRITIN PANEL
Ferritin: 37 ng/mL (ref 15–150)
Iron Saturation: 14 % — ABNORMAL LOW (ref 15–55)
Iron: 56 ug/dL (ref 27–159)
Total Iron Binding Capacity: 398 ug/dL (ref 250–450)
UIBC: 342 ug/dL (ref 131–425)

## 2021-08-12 LAB — HEMOGLOBIN A1C
Est. average glucose Bld gHb Est-mCnc: 120 mg/dL
Hgb A1c MFr Bld: 5.8 % — ABNORMAL HIGH (ref 4.8–5.6)

## 2021-09-12 ENCOUNTER — Other Ambulatory Visit: Payer: Self-pay

## 2021-09-12 ENCOUNTER — Ambulatory Visit
Admission: RE | Admit: 2021-09-12 | Discharge: 2021-09-12 | Disposition: A | Discharge status: Home / Self Care | Payer: 59 | Source: Ambulatory Visit | Attending: Nurse Practitioner | Admitting: Nurse Practitioner

## 2021-09-12 DIAGNOSIS — Z1231 Encounter for screening mammogram for malignant neoplasm of breast: Secondary | ICD-10-CM

## 2021-09-13 ENCOUNTER — Ambulatory Visit: Payer: 59

## 2021-09-13 DIAGNOSIS — Z23 Encounter for immunization: Secondary | ICD-10-CM

## 2021-12-13 ENCOUNTER — Ambulatory Visit: Payer: 59 | Admitting: Nurse Practitioner

## 2021-12-14 ENCOUNTER — Ambulatory Visit (INDEPENDENT_AMBULATORY_CARE_PROVIDER_SITE_OTHER): Payer: 59 | Admitting: Nurse Practitioner

## 2021-12-14 ENCOUNTER — Encounter: Payer: Self-pay | Admitting: Nurse Practitioner

## 2021-12-14 ENCOUNTER — Other Ambulatory Visit: Payer: Self-pay

## 2021-12-14 VITALS — BP 130/68 | HR 58 | Temp 98.1°F | Ht 63.4 in | Wt 247.0 lb

## 2021-12-14 DIAGNOSIS — I10 Essential (primary) hypertension: Secondary | ICD-10-CM | POA: Diagnosis not present

## 2021-12-14 DIAGNOSIS — Z6841 Body Mass Index (BMI) 40.0 and over, adult: Secondary | ICD-10-CM

## 2021-12-14 DIAGNOSIS — Z23 Encounter for immunization: Secondary | ICD-10-CM | POA: Diagnosis not present

## 2021-12-14 DIAGNOSIS — R5383 Other fatigue: Secondary | ICD-10-CM

## 2021-12-14 DIAGNOSIS — R7303 Prediabetes: Secondary | ICD-10-CM

## 2021-12-14 MED ORDER — OZEMPIC (0.25 OR 0.5 MG/DOSE) 2 MG/1.5ML ~~LOC~~ SOPN: 0.5000 mg | PEN_INJECTOR | SUBCUTANEOUS | 1 refills | Status: DC

## 2021-12-14 MED ORDER — ZOSTER VAC RECOMB ADJUVANTED 50 MCG/0.5ML IM SUSR: 0.5000 mL | Freq: Once | INTRAMUSCULAR | 0 refills | Status: AC

## 2021-12-14 NOTE — Patient Instructions (Addendum)
Exercising to Lose Weight Getting regular exercise is important for everyone. It is especially important if you are overweight. Being overweight increases your risk of heart disease, stroke, diabetes, high blood pressure, and several types of cancer. Exercising, and reducing the calories you consume, can help you lose weight and improve fitness and health. Exercise can be moderate or vigorous intensity. To lose weight, most people need to do a certain amount of moderate or vigorous-intensity exercise each week. How can exercise affect me? You lose weight when you exercise enough to burn more calories than you eat. Exercise also reduces body fat and builds muscle. The more muscle you have, the more calories you burn. Exercise also: Improves mood. Reduces stress and tension. Improves your overall fitness, flexibility, and endurance. Increases bone strength. Moderate-intensity exercise Moderate-intensity exercise is any activity that gets you moving enough to burn at least three times more energy (calories) than if you were sitting. Examples of moderate exercise include: Walking a mile in 15 minutes. Doing light yard work. Biking at an easy pace. Most people should get at least 150 minutes of moderate-intensity exercise a week to maintain their body weight. Vigorous-intensity exercise Vigorous-intensity exercise is any activity that gets you moving enough to burn at least six times more calories than if you were sitting. When you exercise at this intensity, you should be working hard enough that you are not able to carry on a conversation. Examples of vigorous exercise include: Running. Playing a team sport, such as football, basketball, and soccer. Jumping rope. Most people should get at least 75 minutes a week of vigorous exercise to maintain their body weight. What actions can I take to lose weight? The amount of exercise you need to lose weight depends on: Your age. The type of  exercise. Any health conditions you have. Your overall physical ability. Talk to your health care provider about how much exercise you need and what types of activities are safe for you. Nutrition  Make changes to your diet as told by your health care provider or diet and nutrition specialist (dietitian). This may include: Eating fewer calories. Eating more protein. Eating less unhealthy fats. Eating a diet that includes fresh fruits and vegetables, whole grains, low-fat dairy products, and lean protein. Avoiding foods with added fat, salt, and sugar. Drink plenty of water while you exercise to prevent dehydration or heat stroke. Activity Choose an activity that you enjoy and set realistic goals. Your health care provider can help you make an exercise plan that works for you. Exercise at a moderate or vigorous intensity most days of the week. The intensity of exercise may vary from person to person. You can tell how intense a workout is for you by paying attention to your breathing and heartbeat. Most people will notice their breathing and heartbeat get faster with more intense exercise. Do resistance training twice each week, such as: Push-ups. Sit-ups. Lifting weights. Using resistance bands. Getting short amounts of exercise can be just as helpful as long, structured periods of exercise. If you have trouble finding time to exercise, try doing these things as part of your daily routine: Get up, stretch, and walk around every 30 minutes throughout the day. Go for a walk during your lunch break. Park your car farther away from your destination. If you take public transportation, get off one stop early and walk the rest of the way. Make phone calls while standing up and walking around. Take the stairs instead of elevators or escalators. Wear  comfortable clothes and shoes with good support. Do not exercise so much that you hurt yourself, feel dizzy, or get very short of breath. Where to  find more information [place] Department of Health and Human Services: [employer]at Centers for Disease Control and Prevention: http://www.wolf.info/ Contact a health care provider: Before starting a new exercise program. If you have questions or concerns about your weight. If you have a medical problem that keeps you from exercising. Get help right away if: You have any of the following while exercising: Injury. Dizziness. Difficulty breathing or shortness of breath that does not go away when you stop exercising. Chest pain. Rapid heartbeat. These symptoms may represent a serious problem that is an emergency. Do not wait to see if the symptoms will go away. Get medical help right away. Call your local emergency services (911 in the [place] Do not drive yourself to the hospital. Summary Getting regular exercise is especially important if you are overweight. Being overweight increases your risk of heart disease, stroke, diabetes, high blood pressure, and several types of cancer. Losing weight happens when you burn more calories than you eat. Reducing the amount of calories you eat, and getting regular moderate or vigorous exercise each week, helps you lose weight. This information is not intended to replace advice given to you by your health care provider. Make sure you discuss any questions you have with your health care provider. Document Revised: 01/30/2021 Document Reviewed: 01/30/2021 Elsevier Patient Education  2022 [place] Following a healthy eating pattern may help you to achieve and maintain a healthy body weight, reduce the risk of chronic disease, and live a long and productive life. It is important to follow a healthy eating pattern at an appropriate calorie level for your body. Your nutritional needs should be met primarily through food by choosing a variety of nutrient-rich foods. What are tips for following this plan? Reading food labels Read labels and choose the  following: Reduced or low sodium. Juices with 100% fruit juice. Foods with low saturated fats and high polyunsaturated and monounsaturated fats. Foods with whole grains, such as whole wheat, cracked wheat, brown rice, and wild rice. Whole grains that are fortified with folic acid. This is recommended for women who are pregnant or who want to become pregnant. Read labels and avoid the following: Foods with a lot of added sugars. These include foods that contain brown sugar, corn sweetener, corn syrup, dextrose, fructose, glucose, high-fructose corn syrup, honey, invert sugar, lactose, malt syrup, maltose, molasses, raw sugar, sucrose, trehalose, or turbinado sugar. Do not eat more than the following amounts of added sugar per day: 6 teaspoons (25 g) for women. 9 teaspoons (38 g) for men. Foods that contain processed or refined starches and grains. Refined grain products, such as white flour, degermed cornmeal, white bread, and white rice. Shopping Choose nutrient-rich snacks, such as vegetables, whole fruits, and nuts. Avoid high-calorie and high-sugar snacks, such as potato chips, fruit snacks, and candy. Use oil-based dressings and spreads on foods instead of solid fats such as butter, stick margarine, or cream cheese. Limit pre-made sauces, mixes, and "instant" products such as flavored rice, instant noodles, and ready-made pasta. Try more plant-protein sources, such as tofu, tempeh, black beans, edamame, lentils, nuts, and seeds. Explore eating plans such as the Mediterranean diet or vegetarian diet. Cooking Use oil to saut or stir-fry foods instead of solid fats such as butter, stick margarine, or lard. Try baking, boiling, grilling, or broiling instead of frying.  Remove the fatty part of meats before cooking. Steam vegetables in water or broth. Meal planning  At meals, imagine dividing your plate into fourths: One-half of your plate is fruits and vegetables. One-fourth of your plate  is whole grains. One-fourth of your plate is protein, especially lean meats, poultry, eggs, tofu, beans, or nuts. Include low-fat dairy as part of your daily diet. Lifestyle Choose healthy options in all settings, including home, work, school, restaurants, or stores. Prepare your food safely: Wash your hands after handling raw meats. Keep food preparation surfaces clean by regularly washing with hot, soapy water. Keep raw meats separate from ready-to-eat foods, such as fruits and vegetables. Cook seafood, meat, poultry, and eggs to the recommended internal temperature. Store foods at safe temperatures. In general: Keep cold foods at 69F (4.4C) or below. Keep hot foods at 169F (60C) or above. Keep your freezer at [facility] (-17.8C) or below. Foods are no longer safe to eat when they have been between the temperatures of 40-169F (4.4-60C) for more than 2 hours. What foods should I eat? Fruits Aim to eat 2 cup-equivalents of fresh, canned (in natural juice), or frozen fruits each day. Examples of 1 cup-equivalent of fruit include 1 small apple, 8 large strawberries, 1 cup canned fruit,  cup dried fruit, or 1 cup 100% juice. Vegetables Aim to eat 2-3 cup-equivalents of fresh and frozen vegetables each day, including different varieties and colors. Examples of 1 cup-equivalent of vegetables include 2 medium carrots, 2 cups raw, leafy greens, 1 cup chopped vegetable (raw or cooked), or 1 medium baked potato. Grains Aim to eat 6 ounce-equivalents of whole grains each day. Examples of 1 ounce-equivalent of grains include 1 slice of bread, 1 cup ready-to-eat cereal, 3 cups popcorn, or  cup cooked rice, pasta, or cereal. Meats and other proteins Aim to eat 5-6 ounce-equivalents of protein each day. Examples of 1 ounce-equivalent of protein include 1 egg, 1/2 cup nuts or seeds, or 1 tablespoon (16 g) peanut butter. A cut of meat or fish that is the size of a deck of cards is about 3-4  ounce-equivalents. Of the protein you eat each week, try to have at least 8 ounces come from seafood. This includes salmon, trout, herring, and anchovies. Dairy Aim to eat 3 cup-equivalents of fat-free or low-fat dairy each day. Examples of 1 cup-equivalent of dairy include 1 cup (240 mL) milk, 8 ounces (250 g) yogurt, 1 ounces (44 g) natural cheese, or 1 cup (240 mL) fortified soy milk. Fats and oils Aim for about 5 teaspoons (21 g) per day. Choose monounsaturated fats, such as canola and olive oils, avocados, peanut butter, and most nuts, or polyunsaturated fats, such as sunflower, corn, and soybean oils, walnuts, pine nuts, sesame seeds, sunflower seeds, and flaxseed. Beverages Aim for six 8-oz glasses of water per day. Limit coffee to three to five 8-oz cups per day. Limit caffeinated beverages that have added calories, such as soda and energy drinks. Limit alcohol intake to no more than 1 drink a day for nonpregnant women and 2 drinks a day for men. One drink equals 12 oz of beer (355 mL), 5 oz of wine (148 mL), or 1 oz of hard liquor (44 mL). Seasoning and other foods Avoid adding excess amounts of salt to your foods. Try flavoring foods with herbs and spices instead of salt. Avoid adding sugar to foods. Try using oil-based dressings, sauces, and spreads instead of solid fats. This information is based on general [place] nutrition  guidelines. For more information, visit [employer]es. Exact amounts may vary based on your nutrition needs. Summary A healthy eating plan may help you to maintain a healthy weight, reduce the risk of chronic diseases, and stay active throughout your life. Plan your meals. Make sure you eat the right portions of a variety of nutrient-rich foods. Try baking, boiling, grilling, or broiling instead of frying. Choose healthy options in all settings, including home, work, school, restaurants, or stores. This information is not intended to replace advice given to you  by your health care provider. Make sure you discuss any questions you have with your health care provider. Document Revised: 08/02/2021 Document Reviewed: 08/02/2021 Elsevier Patient Education  [place].

## 2021-12-14 NOTE — Progress Notes (Signed)
Jeri Cos [place],acting as a Neurosurgeon for Arnette Felts, FNP.,have documented all relevant documentation on the behalf of Arnette Felts, FNP,as directed by  Arnette Felts, FNP while in the presence of Arnette Felts, FNP.   This visit occurred during the SARS-CoV-2 public health emergency.  Safety protocols were in place, including screening questions prior to the visit, additional usage of staff PPE, and extensive cleaning of exam room while observing appropriate contact time as indicated for disinfecting solutions.  Subjective:     Patient ID: Amy Blair , female    DOB: 1966/02/20 , 55 y.o.   MRN: [id number]   Chief Complaint  Patient presents with   Weight Check   Hypertension    HPI  Patient presents today for a weight check. She would like to try a different medication since the Saxenda was not covered by her insurance. She has taken contrave in the past as well. She took phentermine and topiramate and had memory loss in the past. She had gastric bypass surgery in 2010. She craves snacks.  She does not drink anything until after her meals. She has cleared her pantry.  She has taken metformin in the past tolerated okay.  Highest weight 398lbs and lowest weight 180 lbs. She is planning to make a home gym with a [place] treadmill and light boxer. She has stairs now. She would like to try on her own.   Wt Readings from Last 3 Encounters: 12/14/21 : 247 lb (112 kg) 08/11/21 : 242 lb 12.8 oz (110.1 kg) 01/24/21 : 237 lb (107.5 kg)      Past Medical History:  Diagnosis Date   Anemia    HX   Arthritis    knees-a little   ASCUS with positive high risk HPV cervical 03/2018   Colposcopy inadequate but negative.  ECC showed LGSIL.   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   Hypertension      Family History  Problem Relation Age of Onset   Arthritis Mother    Cancer Mother        Cervix    Diabetes Mother    Hyperlipidemia Mother    Hypertension Mother    Diabetes Father    Heart disease Father    Cancer Sister        cervical   Diabetes Sister    Hypertension Sister    Diabetes Sister    Hypertension Sister    Breast cancer Maternal Aunt    Anesthesia problems Neg Hx      Current Outpatient Medications:    cetirizine (ZYRTEC) 10 MG tablet, Take 1 tablet (10 mg total) by mouth daily., Disp: 30 tablet, Rfl: 3   Cholecalciferol (VITAMIN D) 50 MCG (2000 UT) CAPS, Take by mouth., Disp: , Rfl:    cyclobenzaprine (FLEXERIL) 10 MG tablet, Take 1 tablet (10 mg total) by mouth 3 (three) times daily as needed for muscle spasms., Disp: 20 tablet, Rfl: 0   ibuprofen (ADVIL) 800 MG tablet, Take 1 tablet (800 mg total) by mouth 3 (three) times daily. Do NOT take on an empty stomach.  Drink plenty of water with this medication, Disp: 21 tablet, Rfl: 0   Lifitegrast (XIIDRA) 5 % SOLN, Apply to eye., Disp: , Rfl:    linaclotide (LINZESS) 145 MCG CAPS capsule, Take 1 capsule (145 mcg total) by mouth daily., Disp: 90 capsule, Rfl: 1  lisinopril-hydrochlorothiazide (ZESTORETIC) 20-25 MG tablet, Take 1 tablet by mouth daily., Disp: 90 tablet, Rfl: 2   meloxicam (MOBIC) 7.5 MG tablet, Take 1 tablet (7.5 mg total) by mouth daily., Disp: 30 tablet, Rfl: 1   Multiple Vitamins-Minerals (MULTIVITAMIN WITH MINERALS) tablet, Bariatric vitamin, Disp: 30 tablet, Rfl: 3   promethazine (PHENERGAN) 25 MG tablet, Take 1 tablet (25 mg total) by mouth every 8 (eight) hours as needed for nausea or vomiting., Disp: 45 tablet, Rfl: 2   Semaglutide,0.25 or 0.5MG /DOS, (OZEMPIC, 0.25 OR 0.5 MG/DOSE,) 2 MG/1.5ML SOPN, Inject 0.5 mg into the skin once a week., Disp: 4.5 mL, Rfl: 1   SUMAtriptan (IMITREX) 100 MG tablet, TAKE ONE TABLET BY MOUTH AT ONSET OF HEADACHE, THEN REPEAT IN 2 HOURS, Disp: 10 tablet, Rfl: 0   valACYclovir (VALTREX) 500 MG tablet, Take 1 tablet (500 mg total) by mouth daily., Disp: 30 tablet,  Rfl: 1   Allergies  Allergen Reactions   Sulfa Antibiotics Hives     Review of Systems  Constitutional: Negative.   Respiratory: Negative.    Cardiovascular: Negative.   Gastrointestinal: Negative.   Psychiatric/Behavioral: Negative.    All other systems reviewed and are negative.   Today's Vitals   12/14/21 1119  BP: 130/68  Pulse: (!) 58  Temp: 98.1 F (36.7 C)  Weight: 247 lb (112 kg)  Height: 5' 3.4" (1.61 m)  PainSc: 0-No pain   Body mass index is 43.2 kg/m.   Objective:  Physical Exam Vitals reviewed.  Constitutional:      General: She is not in acute distress.    Appearance: Normal appearance. She is well-developed. She is obese.  Eyes:     Pupils: Pupils are equal, round, and reactive to light.  Cardiovascular:     Rate and Rhythm: Normal rate and regular rhythm.     Pulses: Normal pulses.     Heart sounds: Normal heart sounds. No murmur heard. Pulmonary:     Effort: Pulmonary effort is normal. No respiratory distress.     Breath sounds: Normal breath sounds. No wheezing.  Musculoskeletal:        General: Normal range of motion.  Skin:    General: Skin is warm and dry.     Capillary Refill: Capillary refill takes less than 2 seconds.  Neurological:     General: No focal deficit present.     Mental Status: She is alert and oriented to person, place, and time.     Cranial Nerves: No cranial nerve deficit.     Motor: No weakness.  Psychiatric:        Mood and Affect: Mood normal.        Behavior: Behavior normal.        Thought Content: Thought content normal.        Judgment: Judgment normal.        Assessment And Plan:     1. Essential hypertension Comments: blood pressure is controlled, continue current medications - Lipid panel  2. Other fatigue Comments: Will check for metabolic causes, pending those results will consider a sleep study - TSH - Thyroid Peroxidase Antibody - Amb Ref to Medical Weight Management  3.  Prediabetes Comments: Stable, encouraged to increase physical activity Will start ozempic, Will start Ozempic 0.25 mg for 4 weeks then increase to 0.5 mg for 2 weeks the initial pen will last for 6 weeks.   - Semaglutide,0.25 or 0.5MG /DOS, (OZEMPIC, 0.25 OR 0.5 MG/DOSE,) 2 MG/1.5ML SOPN; Inject 0.5 mg into the  skin once a week.  Dispense: 4.5 mL; Refill: 1  4. Class 3 severe obesity due to excess calories with serious comorbidity and body mass index (BMI) of 40.0 to 44.9 in adult [facility]) Chronic Discussed healthy diet and regular exercise options  Encouraged to exercise at least 150 minutes per week with 2 days of strength training as tolerated - Insulin, random - TSH - Amb Ref to Medical Weight Management  5. Immunization due Influenza vaccine administered Encouraged to take Tylenol as needed for fever or muscle aches. - Flu Vaccine QUAD 6+ mos PF IM (Fluarix Quad PF) - Zoster Vaccine Adjuvanted [facility]) injection; Inject 0.5 mLs into the muscle once for 1 dose.  Dispense: 0.5 mL; Refill: 0     Patient was given opportunity to ask questions. Patient verbalized understanding of the plan and was able to repeat key elements of the plan. All questions were answered to their satisfaction.  Arnette Felts, FNP   I, Arnette Felts, FNP, have reviewed all documentation for this visit. The documentation on 12/29/21 for the exam, diagnosis, procedures, and orders are all accurate and complete.   IF YOU HAVE BEEN REFERRED TO A SPECIALIST, IT MAY TAKE 1-2 WEEKS TO SCHEDULE/PROCESS THE REFERRAL. IF YOU HAVE NOT HEARD FROM US/SPECIALIST IN TWO WEEKS, PLEASE GIVE [place] A CALL AT [phone].   THE PATIENT IS ENCOURAGED TO PRACTICE SOCIAL DISTANCING DUE TO THE COVID-19 PANDEMIC.

## 2021-12-15 LAB — LIPID PANEL
Chol/HDL Ratio: 2.6 ratio (ref 0.0–4.4)
Cholesterol, Total: 136 mg/dL (ref 100–199)
HDL: 53 mg/dL (ref >39)
LDL Chol Calc (NIH): 68 mg/dL (ref 0–99)
Triglycerides: 73 mg/dL (ref 0–149)
VLDL Cholesterol Cal: 15 mg/dL (ref 5–40)

## 2021-12-15 LAB — INSULIN, RANDOM: INSULIN: 11.6 u[IU]/mL (ref 2.6–24.9)

## 2021-12-15 LAB — THYROID PEROXIDASE ANTIBODY: Thyroperoxidase Ab SerPl-aCnc: 17 IU/mL (ref 0–34)

## 2021-12-15 LAB — TSH: TSH: 0.409 u[IU]/mL — ABNORMAL LOW (ref 0.450–4.500)

## 2021-12-28 ENCOUNTER — Other Ambulatory Visit: Payer: Self-pay

## 2021-12-28 DIAGNOSIS — R5383 Other fatigue: Secondary | ICD-10-CM

## 2021-12-28 DIAGNOSIS — R7989 Other specified abnormal findings of blood chemistry: Secondary | ICD-10-CM

## 2021-12-29 ENCOUNTER — Other Ambulatory Visit: Payer: Self-pay | Admitting: Nurse Practitioner

## 2021-12-29 DIAGNOSIS — R5383 Other fatigue: Secondary | ICD-10-CM

## 2021-12-29 DIAGNOSIS — Z6841 Body Mass Index (BMI) 40.0 and over, adult: Secondary | ICD-10-CM

## 2021-12-29 LAB — TSH: TSH: 0.622 u[IU]/mL (ref 0.450–4.500)

## 2021-12-29 NOTE — Progress Notes (Signed)
I will order a sleep study to evaluate for your fatigue

## 2022-01-18 ENCOUNTER — Other Ambulatory Visit: Payer: Self-pay | Admitting: Nurse Practitioner

## 2022-01-18 DIAGNOSIS — R7303 Prediabetes: Secondary | ICD-10-CM

## 2022-01-30 NOTE — Telephone Encounter (Signed)
Please check on PA

## 2022-02-09 LAB — BASIC METABOLIC PANEL
BUN: 24 — AB (ref 4–21)
CO2: 24 — AB (ref 13–22)
Chloride: 104 (ref 99–108)
Creatinine: 0.9 (ref 0.5–1.1)
Glucose: 72
Potassium: 4.8 (ref 3.4–5.3)
Sodium: 142 (ref 137–147)

## 2022-02-09 LAB — CBC AND DIFFERENTIAL
HCT: 33 — AB (ref 36–46)
Hemoglobin: 11 — AB (ref 12.0–16.0)
Neutrophils Absolute: 2.3
Platelets: 423 — AB (ref 150–399)
WBC: 6

## 2022-02-09 LAB — COMPREHENSIVE METABOLIC PANEL
Albumin: 4.2 (ref 3.5–5.0)
Calcium: 9.3 (ref 8.7–10.7)
Globulin: 4.1
eGFR: 75

## 2022-02-09 LAB — HEPATIC FUNCTION PANEL
ALT: 16 (ref 7–35)
AST: 22 (ref 13–35)
Alkaline Phosphatase: 141 — AB (ref 25–125)
Bilirubin, Total: 0.2

## 2022-02-09 LAB — CBC
RBC: 3.71 — AB (ref 3.87–5.11)
RBC: 3.71 — AB (ref 3.87–5.11)

## 2022-02-09 LAB — IRON,TIBC AND FERRITIN PANEL: Iron: 49

## 2022-02-09 LAB — LIPID PANEL
Cholesterol: 148 (ref 0–200)
HDL: 56 (ref 35–70)
Triglycerides: 77 (ref 40–160)

## 2022-02-14 ENCOUNTER — Encounter: Payer: Self-pay | Admitting: Nurse Practitioner

## 2022-02-14 ENCOUNTER — Other Ambulatory Visit: Payer: Self-pay

## 2022-02-14 ENCOUNTER — Ambulatory Visit (INDEPENDENT_AMBULATORY_CARE_PROVIDER_SITE_OTHER): Payer: 59 | Admitting: Nurse Practitioner

## 2022-02-14 VITALS — BP 124/82 | HR 74 | Temp 98.1°F | Ht 63.0 in | Wt 246.0 lb

## 2022-02-14 DIAGNOSIS — Z6841 Body Mass Index (BMI) 40.0 and over, adult: Secondary | ICD-10-CM

## 2022-02-14 DIAGNOSIS — Z01818 Encounter for other preprocedural examination: Secondary | ICD-10-CM | POA: Diagnosis not present

## 2022-02-14 DIAGNOSIS — Z23 Encounter for immunization: Secondary | ICD-10-CM | POA: Diagnosis not present

## 2022-02-14 DIAGNOSIS — I10 Essential (primary) hypertension: Secondary | ICD-10-CM

## 2022-02-14 NOTE — Progress Notes (Signed)
I,[name],acting as a Neurosurgeon for Arnette Felts, FNP.,have documented all relevant documentation on the behalf of Arnette Felts, FNP,as directed by  Arnette Felts, FNP while in the presence of Arnette Felts, FNP.   This visit occurred during the SARS-CoV-2 public health emergency.  Safety protocols were in place, including screening questions prior to the visit, additional usage of staff PPE, and extensive cleaning of exam room while observing appropriate contact time as indicated for disinfecting solutions.  Subjective:     Patient ID: Amy Blair , female    DOB: 12-27-1965 , 56 y.o.   MRN: [id number]   Chief Complaint  Patient presents with   Hypertension   Obesity    HPI  Pt presents for BPC & weight. Pt went to [facility] to try to pick up medication Ozempic. They stated she needs to go through a training before they can give her the medication.  She is planning to do a revision with [facility] weight los with Dr. Truman Hayward. She had her first gastric bypass in 2012. Her insurance does not cover, it will be $10,000. She will be followed by 1 year. She works from home and will only be out for 2 days. She needs an EKG. Her lowest weight was 185 lbs. She is walking regularly. She ordered a peleton treadmill and bike.     Past Medical History:  Diagnosis Date   Anemia    HX   Arthritis    knees-a little   ASCUS with positive high risk HPV cervical 03/2018   Colposcopy inadequate but negative.  ECC showed LGSIL.   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   Hypertension      Family History  Problem Relation Age of Onset   Arthritis Mother    Cancer Mother        Cervix   Diabetes Mother    Hyperlipidemia Mother    Hypertension Mother    Diabetes Father    Heart disease Father    Cancer Sister        cervical   Diabetes Sister    Hypertension Sister    Diabetes Sister     Hypertension Sister    Breast cancer Maternal Aunt    Anesthesia problems Neg Hx      Current Outpatient Medications:    cetirizine (ZYRTEC) 10 MG tablet, Take 1 tablet (10 mg total) by mouth daily., Disp: 30 tablet, Rfl: 3   Cholecalciferol (VITAMIN D) 50 MCG (2000 UT) CAPS, Take by mouth., Disp: , Rfl:    cyclobenzaprine (FLEXERIL) 10 MG tablet, Take 1 tablet (10 mg total) by mouth 3 (three) times daily as needed for muscle spasms., Disp: 20 tablet, Rfl: 0   ibuprofen (ADVIL) 800 MG tablet, Take 1 tablet (800 mg total) by mouth 3 (three) times daily. Do NOT take on an empty stomach.  Drink plenty of water with this medication, Disp: 21 tablet, Rfl: 0   Lifitegrast (XIIDRA) 5 % SOLN, Apply to eye., Disp: , Rfl:    linaclotide (LINZESS) 145 MCG CAPS capsule, Take 1 capsule (145 mcg total) by mouth daily., Disp: 90 capsule, Rfl: 1   lisinopril-hydrochlorothiazide (ZESTORETIC) 20-25 MG tablet, Take 1 tablet by mouth daily., Disp: 90 tablet, Rfl: 2   meloxicam (MOBIC) 7.5 MG tablet, Take 1 tablet (7.5 mg total) by mouth daily., Disp: 30 tablet, Rfl: 1  Multiple Vitamins-Minerals (MULTIVITAMIN WITH MINERALS) tablet, Bariatric vitamin, Disp: 30 tablet, Rfl: 3   promethazine (PHENERGAN) 25 MG tablet, Take 1 tablet (25 mg total) by mouth every 8 (eight) hours as needed for nausea or vomiting., Disp: 45 tablet, Rfl: 2   SUMAtriptan (IMITREX) 100 MG tablet, TAKE ONE TABLET BY MOUTH AT ONSET OF HEADACHE, THEN REPEAT IN 2 HOURS, Disp: 10 tablet, Rfl: 0   valACYclovir (VALTREX) 500 MG tablet, Take 1 tablet (500 mg total) by mouth daily., Disp: 30 tablet, Rfl: 1   Semaglutide,0.25 or 0.5MG /DOS, (OZEMPIC, 0.25 OR 0.5 MG/DOSE,) 2 MG/1.5ML SOPN, Inject 0.5 mg into the skin once a week. (Patient not taking: Reported on 02/14/2022), Disp: 4.5 mL, Rfl: 1   Allergies  Allergen Reactions   Sulfa Antibiotics Hives     Review of Systems  Constitutional: Negative.   Respiratory: Negative.    Cardiovascular:  Negative.   Neurological: Negative.   Psychiatric/Behavioral: Negative.      Today's Vitals   02/14/22 1024  BP: 124/82  Pulse: 74  Temp: 98.1 F (36.7 C)  Weight: 246 lb (111.6 kg)  Height: 5\' 3"  (1.6 m)   Body mass index is 43.58 kg/m.   Objective:  Physical Exam Vitals reviewed.  Constitutional:      General: She is not in acute distress.    Appearance: Normal appearance. She is well-developed. She is obese.  Eyes:     Pupils: Pupils are equal, round, and reactive to light.  Cardiovascular:     Rate and Rhythm: Normal rate and regular rhythm.     Pulses: Normal pulses.     Heart sounds: Normal heart sounds. No murmur heard. Pulmonary:     Effort: Pulmonary effort is normal. No respiratory distress.     Breath sounds: Normal breath sounds. No wheezing.  Musculoskeletal:        General: Normal range of motion.  Skin:    General: Skin is warm and dry.     Capillary Refill: Capillary refill takes less than 2 seconds.  Neurological:     General: No focal deficit present.     Mental Status: She is alert and oriented to person, place, and time.     Cranial Nerves: No cranial nerve deficit.     Motor: No weakness.  Psychiatric:        Mood and Affect: Mood normal.        Behavior: Behavior normal.        Thought Content: Thought content normal.        Judgment: Judgment normal.        Assessment And Plan:     1. Essential hypertension Comments: Blood pressure is controlled, continue current medications - EKG 12-Lead  2. Class 3 severe obesity due to excess calories with serious comorbidity and body mass index (BMI) of 40.0 to 44.9 in adult [facility]) Comments: She is scheduled for gastric bypass revision in March  3. Immunization due Comments: Shingrix given in office - Varicella-zoster vaccine IM (Shingrix)  4. Preop examination Comments: EKG done with SR HR 56, she has had low heart rate on previous EKG's and has been seen by Cardiology initially     Patient  was given opportunity to ask questions. Patient verbalized understanding of the plan and was able to repeat key elements of the plan. All questions were answered to their satisfaction.  Arnette Felts, FNP   I, Arnette Felts, FNP, have reviewed all documentation for this visit. The documentation on 02/15/22 for  the exam, diagnosis, procedures, and orders are all accurate and complete.   IF YOU HAVE BEEN REFERRED TO A SPECIALIST, IT MAY TAKE 1-2 WEEKS TO SCHEDULE/PROCESS THE REFERRAL. IF YOU HAVE NOT HEARD FROM US/SPECIALIST IN TWO WEEKS, PLEASE GIVE [place] A CALL AT [phone].   THE PATIENT IS ENCOURAGED TO PRACTICE SOCIAL DISTANCING DUE TO THE COVID-19 PANDEMIC.

## 2022-02-14 NOTE — Patient Instructions (Signed)

## 2022-02-17 ENCOUNTER — Encounter: Payer: Self-pay | Admitting: Nurse Practitioner

## 2022-02-24 ENCOUNTER — Ambulatory Visit (HOSPITAL_COMMUNITY)
Admission: EM | Admit: 2022-02-24 | Discharge: 2022-02-24 | Disposition: A | Discharge status: Home / Self Care | Payer: 59 | Attending: Urgent Care | Admitting: Urgent Care

## 2022-02-24 ENCOUNTER — Encounter (HOSPITAL_COMMUNITY): Payer: Self-pay

## 2022-02-24 ENCOUNTER — Other Ambulatory Visit: Payer: Self-pay

## 2022-02-24 DIAGNOSIS — R001 Bradycardia, unspecified: Secondary | ICD-10-CM

## 2022-02-24 DIAGNOSIS — Z01818 Encounter for other preprocedural examination: Secondary | ICD-10-CM

## 2022-02-24 NOTE — ED Triage Notes (Signed)
Pt presents for EKG for weight loss surgery on 03/03/2022.  ?

## 2022-02-24 NOTE — ED Provider Notes (Signed)
[facility]    CSN: [id number] Arrival date & time: 02/24/22  1207      History   Chief Complaint No chief complaint on file.   HPI Amy Blair is a 56 y.o. female.   Pleasant 56 year old female presents today requesting EKG only.  She has a revision of bariatric surgery scheduled for the 17th of this month. She had an EKG performed by her PCP on 02/14/22, but apparently the results were unable to be forwarded to the surgeon. Pt denies any concerns. No CP, SOB, palpitations. Has had bradycardia her "whole life." Had a workup with cardiology she believes in 2008 complete with ECHO and "nothing was found." BP was normal at her most recent PCP visit, attributes elevation today due to being nervous her surgery was going to be post-poned.     Past Medical History:  Diagnosis Date   Anemia    HX   Arthritis    knees-a little   ASCUS with positive high risk HPV cervical 03/2018   Colposcopy inadequate but negative.  ECC showed LGSIL.   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   Hypertension     Patient Active Problem List   Diagnosis Date Noted   Constipation 03/01/2020   Gastric bypass status for obesity 05/29/2016   HSV infection 03/22/2016   B12 deficiency 07/30/2015   Bradycardia 06/07/2015   Migraine headache 04/07/2015   OSTEOARTHRITIS, KNEES, BILATERAL 12/24/2009   Allergic rhinitis 04/30/2009   Class 2 obesity 08/10/2007   Iron deficiency anemia 08/10/2007   Essential hypertension 08/10/2007    Past Surgical History:  Procedure Laterality Date   BREAST SURGERY     Reduction   CESAREAN SECTION     COSMETIC SURGERY     GASTRIC BYPASS  06/2009   LIPOSUCTION  04/05/2012   Procedure: LIPOSUCTION;  Surgeon: Louisa Second, MD;  Location: [facility];  Service: Plastics;  Laterality: N/A;   PANNICULECTOMY  04/05/2012   Procedure: PANNICULECTOMY;  Surgeon: Louisa Second, MD;   Location: [facility] OR;  Service: Plastics;  Laterality: N/A;   panniculectomy  hernia repair  04/05/2012   REDUCTION MAMMAPLASTY Bilateral 04/28/2017   Torn quad reapair  07/2008   Right   UMBILICAL HERNIA REPAIR  04/05/2012   Procedure: HERNIA REPAIR UMBILICAL ADULT;  Surgeon: Louisa Second, MD;  Location: [facility] OR;  Service: Plastics;  Laterality: N/A;    OB History     Gravida  2   Para  1   Term      Preterm      AB  1   Living  0      SAB  1   IAB      Ectopic      Multiple      Live Births               Home Medications    Prior to Admission medications   Medication Sig Start Date End Date Taking? Authorizing Provider  cetirizine (ZYRTEC) 10 MG tablet Take 1 tablet (10 mg total) by mouth daily. 06/07/15   Salley Scarlet, MD  Cholecalciferol (VITAMIN D) 50 MCG (2000 UT) CAPS Take by mouth.    [provider]  cyclobenzaprine (FLEXERIL) 10 MG tablet Take 1 tablet (10 mg total) by mouth 3 (three) times daily as needed for muscle spasms. 08/10/20   Salley Scarlet, MD  ibuprofen (ADVIL) 800 MG tablet Take 1 tablet (800 mg total) by mouth 3 (three) times daily. Do NOT take on an empty stomach.  Drink plenty of water with this medication 11/01/19   Arnaldo Natal, MD  Lifitegrast Benay Spice) 5 % SOLN Apply to eye.    [provider]  linaclotide (LINZESS) 145 MCG CAPS capsule Take 1 capsule (145 mcg total) by mouth daily. 01/24/21   Salley Scarlet, MD  lisinopril-hydrochlorothiazide (ZESTORETIC) 20-25 MG tablet Take 1 tablet by mouth daily. 08/11/21   Arnette Felts, FNP  meloxicam (MOBIC) 7.5 MG tablet Take 1 tablet (7.5 mg total) by mouth daily. 01/24/21   Salley Scarlet, MD  Multiple Vitamins-Minerals (MULTIVITAMIN WITH MINERALS) tablet Bariatric vitamin 01/24/21   Salley Scarlet, MD  promethazine (PHENERGAN) 25 MG tablet Take 1 tablet (25 mg total) by mouth every 8 (eight) hours as needed for nausea or vomiting. 08/13/18   Salley Scarlet, MD   Semaglutide,0.25 or 0.5MG /DOS, (OZEMPIC, 0.25 OR 0.5 MG/DOSE,) 2 MG/1.5ML SOPN Inject 0.5 mg into the skin once a week. Patient not taking: Reported on 02/14/2022 12/14/21   Arnette Felts, FNP  SUMAtriptan (IMITREX) 100 MG tablet TAKE ONE TABLET BY MOUTH AT ONSET OF HEADACHE, THEN REPEAT IN 2 HOURS 01/24/21   Salley Scarlet, MD  valACYclovir (VALTREX) 500 MG tablet Take 1 tablet (500 mg total) by mouth daily. 01/24/21   Salley Scarlet, MD    Family History Family History  Problem Relation Age of Onset   Arthritis Mother    Cancer Mother        Cervix   Diabetes Mother    Hyperlipidemia Mother    Hypertension Mother    Diabetes Father    Heart disease Father    Cancer Sister        cervical   Diabetes Sister    Hypertension Sister    Diabetes Sister    Hypertension Sister    Breast cancer Maternal Aunt    Anesthesia problems Neg Hx     Social History Social History   Tobacco Use   Smoking status: Never   Smokeless tobacco: Never  Vaping Use   Vaping Use: Never used  Substance Use Topics   Alcohol use: Yes    Alcohol/week: 0.0 standard drinks    Comment: OCCASIONALLY    Drug use: No     Allergies   Sulfa antibiotics   Review of Systems Review of Systems  All other systems reviewed and are negative.   Physical Exam Triage Vital Signs ED Triage Vitals [02/24/22 1259]  Enc Vitals Group     BP (!) 173/72     Pulse Rate (!) 50     Resp 16     Temp 98.1 F (36.7 C)     Temp Source Oral     SpO2 97 %     Weight      Height      Head Circumference      Peak Flow      Pain Score      Pain Loc      Pain Edu?      Excl. in GC?    No data found.  Updated Vital Signs BP (!) 173/72 (BP Location: Left Arm)   Pulse (!) 50   Temp 98.1 F (36.7 C) (Oral)   Resp 16   LMP 02/20/2012   SpO2 97%   Visual Acuity Right Eye Distance:   Left Eye Distance:  Bilateral Distance:    Right Eye Near:   Left Eye Near:    Bilateral Near:     Physical  Exam Vitals and nursing note reviewed.  Constitutional:      General: She is not in acute distress.    Appearance: Normal appearance. She is obese. She is not ill-appearing, toxic-appearing or diaphoretic.  HENT:     Mouth/Throat:     Mouth: Mucous membranes are moist.  Cardiovascular:     Rate and Rhythm: Regular rhythm. Bradycardia present.     Pulses: Normal pulses.     Heart sounds: Normal heart sounds. No murmur heard.   No friction rub. No gallop.  Pulmonary:     Effort: Pulmonary effort is normal. No respiratory distress.     Breath sounds: Normal breath sounds. No stridor. No wheezing, rhonchi or rales.  Chest:     Chest wall: No tenderness.  Musculoskeletal:     Cervical back: Normal range of motion. No rigidity or tenderness.  Skin:    General: Skin is warm.     Capillary Refill: Capillary refill takes less than 2 seconds.     Findings: No erythema or rash.  Neurological:     Mental Status: She is alert.     UC Treatments / Results  Labs (all labs ordered are listed, but only abnormal results are displayed) Labs Reviewed - No data to display  EKG   Radiology No results found.  Procedures ED EKG  Date/Time: 02/24/2022 2:36 PM Performed by: Maretta Bees, PA Authorized by: Maretta Bees, PA   Previous ECG:    Previous ECG:  Compared to current   Similarity:  No change   Comparison ECG info:  2016 Rate:    ECG rate assessment: bradycardic   Rhythm:    Rhythm: sinus rhythm   Ectopy:    Ectopy: PAC   (including critical care time)  Medications Ordered in UC Medications - No data to display  Initial Impression / Assessment and Plan / UC Course  I have reviewed the triage vital signs and the nursing notes.  Pertinent labs & imaging results that were available during my care of the patient were reviewed by me and considered in my medical decision making (see chart for details).     Pre-op EKG testing - marked sinus bradycardia with few PACs  noted. This appears stable from 2016. Pt understands that her exam today is NOT a medical clearance exam, we are solely providing her with a copy of her EKG report to proceed with her bariatric revision if cleared from her PCP/ cardiologist. Bradycardia - pt states this has been for >15 years and specialist workup already completed. Unable to see cardiology documentation per chart review/ care everywhere. Pt appears asx. F/U with PCP for chronic health maintenance. Essential HTN - was 128 systolic at her recent PCP visit. Monitor at home.  Final Clinical Impressions(s) / UC Diagnoses   Final diagnoses:  Preop testing  Bradycardia     Discharge Instructions      EKG performed in office, which reveals no acute changes when compared to one in 2016. Please fax your results to your surgeon. This is not a medical clearance exam. This must come from your PCP or cardiologist.      ED Prescriptions   None    PDMP not reviewed this encounter.   Maretta Bees, [place] 02/24/22 1437

## 2022-02-24 NOTE — Discharge Instructions (Signed)
EKG performed in office, which reveals no acute changes when compared to one in 2016. ?Please fax your results to your surgeon. This is not a medical clearance exam. This must come from your PCP or cardiologist.  ?

## 2022-03-03 HISTORY — PX: GASTRIC BYPASS: SHX52

## 2022-03-03 HISTORY — PX: BARIATRIC SURGERY: SHX1103

## 2022-03-09 ENCOUNTER — Institutional Professional Consult (permissible substitution): Payer: Self-pay | Admitting: Neurology

## 2022-03-14 ENCOUNTER — Encounter: Payer: Self-pay | Admitting: Nurse Practitioner

## 2022-03-15 ENCOUNTER — Ambulatory Visit: Payer: 59 | Admitting: Neurology

## 2022-03-15 ENCOUNTER — Encounter: Payer: Self-pay | Admitting: Neurology

## 2022-03-15 VITALS — BP 124/82 | HR 60 | Ht 63.0 in | Wt 233.5 lb

## 2022-03-15 DIAGNOSIS — E662 Morbid (severe) obesity with alveolar hypoventilation: Secondary | ICD-10-CM | POA: Insufficient documentation

## 2022-03-15 DIAGNOSIS — D508 Other iron deficiency anemias: Secondary | ICD-10-CM

## 2022-03-15 DIAGNOSIS — D649 Anemia, unspecified: Secondary | ICD-10-CM | POA: Insufficient documentation

## 2022-03-15 DIAGNOSIS — G4710 Hypersomnia, unspecified: Secondary | ICD-10-CM

## 2022-03-15 DIAGNOSIS — R0683 Snoring: Secondary | ICD-10-CM

## 2022-03-15 DIAGNOSIS — R001 Bradycardia, unspecified: Secondary | ICD-10-CM

## 2022-03-15 DIAGNOSIS — Z6841 Body Mass Index (BMI) 40.0 and over, adult: Secondary | ICD-10-CM

## 2022-03-15 NOTE — Progress Notes (Signed)
? ? ?SLEEP MEDICINE CLINIC ?  ? ?Provider:  Melvyn Novas, MD  ?Primary Care Physician:  Arnette Felts, FNP ?[address] ?[place] [address]  ? ?  ?Referring Provider: Arnette Felts, Fnp ?[address] ?[address] ?[place],  [place] [address]  ?  ?  ?    ?Chief Complaint according to patient   ?Patient presents with:  ?  ? New Patient (Initial Visit)  ?     ?  ?  ?HISTORY OF PRESENT ILLNESS:  ?Amy Blair is a 56 y.o.  African American female patient who I have seen on 03/15/2022 upon referral from Arnette Felts, NP  for a sleep evaluation/ consultation. Marland Kitchen  ?Chief concern according to patient :   ? ?  ? [name]  has a past medical history of Anemia, Arthritis, ASCUS with positive high risk HPV cervical (03/2018), Diabetes mellitus without complication (HCC), Heart murmur, HSV-2 (herpes simplex virus 2) infection, and Hypertension.The patient was 400 pounds to have bariatric surgery, roux and Y in 2010, now had a revision last week, March 17th, 2023. She weights 233 pounds.  ? ?  ?The patient never had a  sleep study , she has seen a cardiologist and psychologist. .  ?  ?Sleep relevant medical history: Nocturia 1-2 , but she drinks a lot of liquids-  no Tonsillectomy, wisdom teeth removed.  Family medical /sleep history: 2 so sisters, one is her twin-  on CPAP with OSA. She is one of 8 kids, born over 8 years.  ? ?Social history: Patient is working as a group home,  and lives in a household with her friend-  lost her daughter in 2018.  ?Family status is single , with no living children, no  grandchildren.  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  ETOH use ;none ,  ?Caffeine intake is now cut down- used to drink some Coffee( 1-2)- walking.  ?Hobbies : shopping and beach, arts and crafts, floristics, cooking.  ? ? ?  ?Sleep habits are as follows: The patient's dinner time is between 5-7 PM.  Small meals, The patient goes to bed  at 9-11 PM and continues to sleep for 6 hours, wakes for 2 bathroom breaks, the first time at 3 AM.   ?The preferred sleep position is  laterally, with the support of 2-3 pillows. Protects from acid reflux.  She is reportedly a mild snorer.  ?Dreams are reportedly frequent/vivid.  ? 6 AM is the usual rise time.  ?The patient wakes up spontaneously at 5.30 ?She reports not feeling refreshed or restored in AM, with symptoms such as dry mouth, slight morning headaches, and residual fatigue. Sips on water in the night.  ?Naps are taken infrequently, lasting from 15 to 30 minutes and are refreshing .  ?  ?Review of Systems: ?Out of a complete 14 system review, the patient complains of only the following symptoms, and all other reviewed systems are negative.:  ?Fatigue, sleepiness , snoring, short sleeper.  ? ?Formerly super obese.  ?  ?How likely are you to doze in the following situations: ?0 = not likely, 1 = slight chance, 2 = moderate chance, 3 = high chance ?  ?Sitting and Reading? ?Watching Television? ?Sitting inactive in a public place (theater or meeting)? ?As a passenger in a car for an hour without a break? ?Lying down in the afternoon when circumstances permit? ?Sitting and talking to  someone? ?Sitting quietly after lunch without alcohol? ?In a car, while stopped for a few minutes in traffic? ?  ?Total = 10/ 24 points  ? FSS endorsed at 28 / 63 points.  ? ?Social History  ? ?Socioeconomic History  ? Marital status: Single  ?  Spouse name: Not on file  ? Number of children: Not on file  ? Years of education: Not on file  ? Highest education level: Not on file  ?Occupational History  ? Not on file  ?Tobacco Use  ? Smoking status: Never  ? Smokeless tobacco: Never  ?Vaping Use  ? Vaping Use: Never used  ?Substance and Sexual Activity  ? Alcohol use: Not Currently  ? Drug use: No  ? Sexual activity: Yes  ?  Comment: 1st intercourse 18 yo-5 partners  ?Other Topics Concern  ? Not on file  ?Social History Narrative   ? Right handed  ? Decaf coffee  ? Lives in a one story home.    ? Works with special needs adults.    ? Education: associates degree.   ? Daughter passed away at age 58, 2018. Had rash and headaches, autopsy showed no cause.   ? ?Social Determinants of Health  ? ?Financial Resource Strain: Not on file  ?Food Insecurity: Not on file  ?Transportation Needs: Not on file  ?Physical Activity: Not on file  ?Stress: Not on file  ?Social Connections: Not on file  ? ? ?Family History  ?Problem Relation Age of Onset  ? Arthritis Mother   ? Cancer Mother   ?     Cervix  ? Diabetes Mother   ? Hyperlipidemia Mother   ? Hypertension Mother   ? Diabetes Father   ? Heart disease Father   ? Cancer Sister   ?     cervical  ? Diabetes Sister   ? Hypertension Sister   ? Diabetes Sister   ? Hypertension Sister   ? Breast cancer Maternal Aunt   ? Anesthesia problems Neg Hx   ? ? ?Past Medical History:  ?Diagnosis Date  ? Anemia   ? HX  ? Arthritis   ? knees-a little  ? ASCUS with positive high risk HPV cervical 03/2018  ? Colposcopy inadequate but negative.  ECC showed LGSIL.  ? Diabetes mellitus without complication (HCC)   ? Heart murmur   ? HSV-2 (herpes simplex virus 2) infection   ? AND HSV 1  ? Hypertension   ? ? ?Past Surgical History:  ?Procedure Laterality Date  ? BARIATRIC SURGERY  03/03/2022  ? BREAST SURGERY    ? Reduction  ? CESAREAN SECTION    ? COSMETIC SURGERY    ? GASTRIC BYPASS  06/2009  ? LIPOSUCTION  04/05/2012  ? Procedure: LIPOSUCTION;  Surgeon: Louisa SecondGerald Truesdale, MD;  [facility];  Service: Plastics;  Laterality: N/A;  ? PANNICULECTOMY  04/05/2012  ? Procedure: PANNICULECTOMY;  Surgeon: Louisa SecondGerald Truesdale, MD;  [facility];  Service: Plastics;  Laterality: N/A;  ? panniculectomy  hernia repair  04/05/2012  ? REDUCTION MAMMAPLASTY Bilateral 04/28/2017  ? Torn quad reapair  07/2008  ? Right  ? UMBILICAL HERNIA REPAIR  04/05/2012  ? Procedure: HERNIA REPAIR UMBILICAL ADULT;  Surgeon: Louisa SecondGerald Truesdale, MD;   [facility];  Service: Plastics;  Laterality: N/A;  ?  ? ?Current Outpatient Medications on File Prior to Visit  ?Medication Sig Dispense Refill  ? cetirizine (ZYRTEC) 10 MG tablet Take 1 tablet (10 mg total) by mouth daily.  30 tablet 3  ? Cholecalciferol (VITAMIN D) 50 MCG (2000 UT) CAPS Take by mouth.    ? cyclobenzaprine (FLEXERIL) 10 MG tablet Take 1 tablet (10 mg total) by mouth 3 (three) times daily as needed for muscle spasms. 20 tablet 0  ? ibuprofen (ADVIL) 800 MG tablet Take 1 tablet (800 mg total) by mouth 3 (three) times daily. Do NOT take on an empty stomach.  Drink plenty of water with this medication 21 tablet 0  ? Lifitegrast (XIIDRA) 5 % SOLN Apply to eye.    ? linaclotide (LINZESS) 145 MCG CAPS capsule Take 1 capsule (145 mcg total) by mouth daily. 90 capsule 1  ? lisinopril-hydrochlorothiazide (ZESTORETIC) 20-25 MG tablet Take 1 tablet by mouth daily. 90 tablet 2  ? meloxicam (MOBIC) 7.5 MG tablet Take 1 tablet (7.5 mg total) by mouth daily. 30 tablet 1  ? Multiple Vitamins-Minerals (MULTIVITAMIN WITH MINERALS) tablet Bariatric vitamin 30 tablet 3  ? promethazine (PHENERGAN) 25 MG tablet Take 1 tablet (25 mg total) by mouth every 8 (eight) hours as needed for nausea or vomiting. 45 tablet 2  ? Semaglutide,0.25 or 0.5MG /DOS, (OZEMPIC, 0.25 OR 0.5 MG/DOSE,) 2 MG/1.5ML SOPN Inject 0.5 mg into the skin once a week. 4.5 mL 1  ? SUMAtriptan (IMITREX) 100 MG tablet TAKE ONE TABLET BY MOUTH AT ONSET OF HEADACHE, THEN REPEAT IN 2 HOURS 10 tablet 0  ? valACYclovir (VALTREX) 500 MG tablet Take 1 tablet (500 mg total) by mouth daily. 30 tablet 1  ? ?No current facility-administered medications on file prior to visit.  ? ? ?Allergies  ?Allergen Reactions  ? Sulfa Antibiotics Hives  ? ? ?Physical exam: ? ?Today's Vitals  ? 03/15/22 0946  ?BP: 124/82  ?Pulse: 60  ?Weight: 233 lb 8 oz (105.9 kg)  ?Height: 5\' 3"  (1.6 m)  ? ?Body mass index is 41.36 kg/m?.  ? ?Wt Readings from Last 3 Encounters:  ?03/15/22 233  lb 8 oz (105.9 kg)  ?02/14/22 246 lb (111.6 kg)  ?12/14/21 247 lb (112 kg)  ?  ? ?Ht Readings from Last 3 Encounters:  ?03/15/22 5\' 3"  (1.6 m)  ?02/14/22 5\' 3"  (1.6 m)  ?12/14/21 5' 3.4" (1.61 m)  ?  ?  ?Ge

## 2022-03-15 NOTE — Patient Instructions (Signed)
Fatigue ?If you have fatigue, you feel tired all the time and have a lack of energy or a lack of motivation. Fatigue may make it difficult to start or complete tasks because of exhaustion. In general, occasional or mild fatigue is often a normal response to activity or life. However, long-lasting (chronic) or extreme fatigue may be a symptom of a medical condition. ?Follow these instructions at home: ?General instructions ?Watch your fatigue for any changes. ?Go to bed and get up at the same time every day. ?Avoid fatigue by pacing yourself during the day and getting enough sleep at night. ?Maintain a healthy weight. ?Medicines ?Take over-the-counter and prescription medicines only as told by your health care provider. ?Take a multivitamin, if told by your health care provider.  ?Do not use herbal or dietary supplements unless they are approved by your health care provider. ?Activity ? ?Exercise regularly, as told by your health care provider. ?Use or practice techniques to help you relax, such as yoga, tai chi, meditation, or massage therapy. ?Eating and drinking ? ?Avoid heavy meals in the evening. ?Eat a well-balanced diet, which includes lean proteins, whole grains, plenty of fruits and vegetables, and low-fat dairy products. ?Avoid consuming too much caffeine. ?Avoid the use of alcohol. ?Drink enough fluid to keep your urine pale yellow. ?Lifestyle ?Change situations that cause you stress. Try to keep your work and personal schedule in balance. ?Do not use any products that contain nicotine or tobacco, such as cigarettes and e-cigarettes. If you need help quitting, ask your health care provider. ?Do not use drugs. ?Contact a health care provider if: ?Your fatigue does not get better. ?You have a fever. ?You suddenly lose or gain weight. ?You have headaches. ?You have trouble falling asleep or sleeping through the night. ?You feel angry, guilty, anxious, or sad. ?You are unable to have a bowel movement  (constipation). ?Your skin is dry. ?You have swelling in your legs or another part of your body. ?Get help right away if: ?You feel confused. ?Your vision is blurry. ?You feel faint or you pass out. ?You have a severe headache. ?You have severe pain in your abdomen, your back, or the area between your waist and hips (pelvis). ?You have chest pain, shortness of breath, or an irregular or fast heartbeat. ?You are unable to urinate, or you urinate less than normal. ?You have abnormal bleeding, such as bleeding from the rectum, vagina, nose, lungs, or nipples. ?You vomit blood. ?You have thoughts about hurting yourself or others. ?If you ever feel like you may hurt yourself or others, or have thoughts about taking your own life, get help right away. You can go to your nearest emergency department or call: ?Your local emergency services (911 in the [place] ?A suicide crisis helpline, such as the National Suicide Prevention Lifeline at 1-800-273-8255 or 988 in the [place] This is open 24 hours a day. ?Summary ?If you have fatigue, you feel tired all the time and have a lack of energy or a lack of motivation. ?Fatigue may make it difficult to start or complete tasks because of exhaustion. ?Long-lasting (chronic) or extreme fatigue may be a symptom of a medical condition. ?Exercise regularly, as told by your health care provider. ?Change situations that cause you stress. Try to keep your work and personal schedule in balance. ?This information is not intended to replace advice given to you by your health care provider. Make sure you discuss any questions you have with your health care provider. ?Document Revised:   06/29/2021 Document Reviewed: 10/14/2020 ?Elsevier Patient Education ? 2022 Elsevier Inc. ? ?

## 2022-06-14 NOTE — Progress Notes (Unsigned)
[facility] [address] [place]. [place] [address] [phone]       OFFICE FOLLOW UP NOTE  Ms. Amy Blair Date of Birth:  Oct 17, 1966 Medical Record Number:  [id number]   Reason for visit: Hypersomnia    SUBJECTIVE:   CHIEF COMPLAINT:  No chief complaint on file.   HPI:   Update 06/15/2022 JM: Patient is being seen for follow-up visit after prior initial visit with Dr. Vickey Huger for hyperinsomnia 3 months ago.  Recommended pursuing sleep study but does not appear order was placed.            ROS:   14 system review of systems performed and negative with exception of ***  PMH:  Past Medical History:  Diagnosis Date   Anemia    HX   Arthritis    knees-a little   ASCUS with positive high risk HPV cervical 03/2018   Colposcopy inadequate but negative.  ECC showed LGSIL.   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   Hypertension     PSH:  Past Surgical History:  Procedure Laterality Date   BARIATRIC SURGERY  03/03/2022   BREAST SURGERY     Reduction   CESAREAN SECTION     COSMETIC SURGERY     GASTRIC BYPASS  06/2009   LIPOSUCTION  04/05/2012   Procedure: LIPOSUCTION;  Surgeon: Louisa Second, MD;  Location: [facility] OR;  Service: Plastics;  Laterality: N/A;   PANNICULECTOMY  04/05/2012   Procedure: PANNICULECTOMY;  Surgeon: Louisa Second, MD;  Location: [facility] OR;  Service: Plastics;  Laterality: N/A;   panniculectomy  hernia repair  04/05/2012   REDUCTION MAMMAPLASTY Bilateral 04/28/2017   Torn quad reapair  07/2008   Right   UMBILICAL HERNIA REPAIR  04/05/2012   Procedure: HERNIA REPAIR UMBILICAL ADULT;  Surgeon: Louisa Second, MD;  Location:  [facility] OR;  Service: Plastics;  Laterality: N/A;    Social History:  Social History   Socioeconomic History   Marital status: Single    Spouse name: Not on file   Number of children: Not on file   Years of  education: Not on file   Highest education level: Not on file  Occupational History   Not on file  Tobacco Use   Smoking status: Never   Smokeless tobacco: Never  Vaping Use   Vaping Use: Never used  Substance and Sexual Activity   Alcohol use: Not Currently   Drug use: No   Sexual activity: Yes    Comment: 1st intercourse 18 yo-5 partners  Other Topics Concern   Not on file  Social History Narrative   Right handed   Decaf coffee   Lives in a one story home.     Works with special needs adults.     Education: associates degree.    Daughter passed away last year at age 96.   Social Determinants of Health   Financial Resource Strain: Not on file  Food Insecurity: Not on file  Transportation Needs: Not on file  Physical Activity: Not on file  Stress: Not on file  Social Connections: Not on file  Intimate Partner Violence: Not on file    Family History:  Family History  Problem Relation Age of Onset   Arthritis Mother    Cancer Mother        Cervix   Diabetes Mother    Hyperlipidemia Mother  Hypertension Mother    Diabetes Father    Heart disease Father    Cancer Sister        cervical   Diabetes Sister    Hypertension Sister    Diabetes Sister    Hypertension Sister    Breast cancer Maternal Aunt    Anesthesia problems Neg Hx     Medications:   Current Outpatient Medications on File Prior to Visit  Medication Sig Dispense Refill   cetirizine (ZYRTEC) 10 MG tablet Take 1 tablet (10 mg total) by mouth daily. 30 tablet 3   Cholecalciferol (VITAMIN D) 50 MCG (2000 UT) CAPS Take by mouth.     cyclobenzaprine (FLEXERIL) 10 MG tablet Take 1 tablet (10 mg total) by mouth 3 (three) times daily as needed for muscle spasms. 20 tablet 0   ibuprofen (ADVIL) 800 MG tablet Take 1 tablet (800 mg total) by mouth 3 (three) times daily. Do NOT take on an empty stomach.  Drink plenty of water with this medication 21 tablet 0   Lifitegrast (XIIDRA) 5 % SOLN Apply to eye.      linaclotide (LINZESS) 145 MCG CAPS capsule Take 1 capsule (145 mcg total) by mouth daily. 90 capsule 1   lisinopril-hydrochlorothiazide (ZESTORETIC) 20-25 MG tablet Take 1 tablet by mouth daily. 90 tablet 2   meloxicam (MOBIC) 7.5 MG tablet Take 1 tablet (7.5 mg total) by mouth daily. 30 tablet 1   Multiple Vitamins-Minerals (MULTIVITAMIN WITH MINERALS) tablet Bariatric vitamin 30 tablet 3   promethazine (PHENERGAN) 25 MG tablet Take 1 tablet (25 mg total) by mouth every 8 (eight) hours as needed for nausea or vomiting. 45 tablet 2   Semaglutide,0.25 or 0.5MG /DOS, (OZEMPIC, 0.25 OR 0.5 MG/DOSE,) 2 MG/1.5ML SOPN Inject 0.5 mg into the skin once a week. 4.5 mL 1   SUMAtriptan (IMITREX) 100 MG tablet TAKE ONE TABLET BY MOUTH AT ONSET OF HEADACHE, THEN REPEAT IN 2 HOURS 10 tablet 0   valACYclovir (VALTREX) 500 MG tablet Take 1 tablet (500 mg total) by mouth daily. 30 tablet 1   No current facility-administered medications on file prior to visit.    Allergies:   Allergies  Allergen Reactions   Sulfa Antibiotics Hives      OBJECTIVE:  Physical Exam  There were no vitals filed for this visit. There is no height or weight on file to calculate BMI. No results found.   General: well developed, well nourished, seated, in no evident distress Head: head normocephalic and atraumatic.   Neck: supple with no carotid or supraclavicular bruits Cardiovascular: regular rate and rhythm, no murmurs Musculoskeletal: no deformity Skin:  no rash/petichiae Vascular:  Normal pulses all extremities   Neurologic Exam Mental Status: Awake and fully alert. Oriented to place and time. Recent and remote memory intact. Attention span, concentration and fund of knowledge appropriate. Mood and affect appropriate.  Cranial Nerves: Pupils equal, briskly reactive to light. Extraocular movements full without nystagmus. Visual fields full to confrontation. Hearing intact. Facial sensation intact. Face, tongue, palate  moves normally and symmetrically.  Motor: Normal bulk and tone. Normal strength in all tested extremity muscles Sensory.: intact to touch , pinprick , position and vibratory sensation.  Coordination: Rapid alternating movements normal in all extremities. Finger-to-nose and heel-to-shin performed accurately bilaterally. Gait and Station: Arises from chair without difficulty. Stance is normal. Gait demonstrates normal stride length and balance without use of AD. Tandem walk and heel toe without difficulty.  Reflexes: 1+ and symmetric. Toes downgoing.  ASSESSMENT/PLAN: JONIKA CRITZ is a 56 y.o. year old female ***.         Follow up in *** or call earlier if needed   CC:  PCP: Arnette Felts, FNP    I spent *** minutes of face-to-face and non-face-to-face time with patient.  This included previsit chart review, lab review, study review, order entry, electronic health record documentation, patient education regarding ***   Ihor Austin, AGNP-BC  [facility] [address] [place], [place] [address]  Phone [phone] Fax [phone] Note: This document was prepared with digital dictation and possible smart phrase technology. Any transcriptional errors that result from this process are unintentional.

## 2022-06-15 ENCOUNTER — Encounter: Payer: Self-pay | Admitting: Adult Health

## 2022-06-15 ENCOUNTER — Ambulatory Visit: Payer: 59 | Admitting: Adult Health

## 2022-06-15 VITALS — BP 140/86 | HR 57 | Ht 63.0 in | Wt 218.4 lb

## 2022-06-15 DIAGNOSIS — G4710 Hypersomnia, unspecified: Secondary | ICD-10-CM | POA: Diagnosis not present

## 2022-07-11 ENCOUNTER — Ambulatory Visit (INDEPENDENT_AMBULATORY_CARE_PROVIDER_SITE_OTHER): Payer: 59 | Admitting: Nurse Practitioner

## 2022-07-11 ENCOUNTER — Other Ambulatory Visit: Payer: Self-pay | Admitting: Nurse Practitioner

## 2022-07-11 ENCOUNTER — Encounter: Payer: Self-pay | Admitting: Nurse Practitioner

## 2022-07-11 VITALS — BP 128/88 | HR 92 | Temp 98.1°F | Ht 63.0 in | Wt 216.6 lb

## 2022-07-11 DIAGNOSIS — E661 Drug-induced obesity: Secondary | ICD-10-CM | POA: Diagnosis not present

## 2022-07-11 DIAGNOSIS — Z23 Encounter for immunization: Secondary | ICD-10-CM

## 2022-07-11 DIAGNOSIS — Z0000 Encounter for general adult medical examination without abnormal findings: Secondary | ICD-10-CM | POA: Diagnosis not present

## 2022-07-11 DIAGNOSIS — E1169 Type 2 diabetes mellitus with other specified complication: Secondary | ICD-10-CM

## 2022-07-11 DIAGNOSIS — R7303 Prediabetes: Secondary | ICD-10-CM

## 2022-07-11 DIAGNOSIS — I10 Essential (primary) hypertension: Secondary | ICD-10-CM | POA: Diagnosis not present

## 2022-07-11 DIAGNOSIS — Z6838 Body mass index (BMI) 38.0-38.9, adult: Secondary | ICD-10-CM

## 2022-07-11 DIAGNOSIS — Z9884 Bariatric surgery status: Secondary | ICD-10-CM

## 2022-07-11 DIAGNOSIS — E538 Deficiency of other specified B group vitamins: Secondary | ICD-10-CM | POA: Diagnosis not present

## 2022-07-11 DIAGNOSIS — R1901 Right upper quadrant abdominal swelling, mass and lump: Secondary | ICD-10-CM

## 2022-07-11 LAB — POCT URINALYSIS DIPSTICK
Bilirubin, UA: NEGATIVE
Blood, UA: NEGATIVE
Glucose, UA: NEGATIVE
Leukocytes, UA: NEGATIVE
Nitrite, UA: NEGATIVE
Protein, UA: POSITIVE — AB
Spec Grav, UA: 1.025 (ref 1.010–1.025)
Urobilinogen, UA: 0.2 E.U./dL
pH, UA: 5 (ref 5.0–8.0)

## 2022-07-11 MED ORDER — OZEMPIC (0.25 OR 0.5 MG/DOSE) 2 MG/1.5ML ~~LOC~~ SOPN
0.5000 mg | PEN_INJECTOR | SUBCUTANEOUS | 1 refills | Status: DC
Start: 2022-07-11 — End: 2022-07-13

## 2022-07-11 NOTE — Progress Notes (Signed)
I,[name],acting as a Education administrator for [name], FNP.,have documented all relevant documentation on the behalf of [name], FNP,as directed by  [name], FNP while in the presence of [name], [place].   Subjective:     Patient ID: Amy Blair , female    DOB: Jan 15, 1966 , 56 y.o.   MRN: [id number]   Chief Complaint  Patient presents with   Annual Exam    HPI  Pt here for HM.  She had a revision of her gastric bypass in March, she notices her pants fit more loose.  Wt Readings from Last 3 Encounters: 07/11/22 : 216 lb 9.6 oz (98.2 kg) 06/15/22 : 218 lb 6.4 oz (99.1 kg) 03/15/22 : 233 lb 8 oz (105.9 kg)       Past Medical History:  Diagnosis Date   Anemia    HX   Arthritis    knees-a little   ASCUS with positive high risk HPV cervical 03/2018   Colposcopy inadequate but negative.  ECC showed LGSIL.   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   Hypertension      Family History  Problem Relation Age of Onset   Arthritis Mother    Cancer Mother        Cervix   Diabetes Mother    Hyperlipidemia Mother    Hypertension Mother    Diabetes Father    Heart disease Father    Cancer Sister        cervical   Diabetes Sister    Hypertension Sister    Diabetes Sister    Hypertension Sister    Breast cancer Maternal Aunt    Anesthesia problems Neg Hx      Current Outpatient Medications:    cetirizine (ZYRTEC) 10 MG tablet, Take 1 tablet (10 mg total) by mouth daily., Disp: 30 tablet, Rfl: 3   Cholecalciferol (VITAMIN D) 50 MCG (2000 UT) CAPS, Take by mouth., Disp: , Rfl:    cyclobenzaprine (FLEXERIL) 10 MG tablet, Take 1 tablet (10 mg total) by mouth 3 (three) times daily as needed for muscle spasms., Disp: 20 tablet, Rfl: 0   ibuprofen (ADVIL) 800 MG tablet, Take 1 tablet (800 mg total) by mouth 3 (three) times daily. Do NOT take on an empty stomach.  Drink plenty of  water with this medication, Disp: 21 tablet, Rfl: 0   Lifitegrast (XIIDRA) 5 % SOLN, Apply to eye., Disp: , Rfl:    linaclotide (LINZESS) 145 MCG CAPS capsule, Take 1 capsule (145 mcg total) by mouth daily., Disp: 90 capsule, Rfl: 1   lisinopril-hydrochlorothiazide (ZESTORETIC) 20-25 MG tablet, Take 1 tablet by mouth daily., Disp: 90 tablet, Rfl: 2   meloxicam (MOBIC) 7.5 MG tablet, Take 1 tablet (7.5 mg total) by mouth daily., Disp: 30 tablet, Rfl: 1   Multiple Vitamins-Minerals (MULTIVITAMIN WITH MINERALS) tablet, Bariatric vitamin, Disp: 30 tablet, Rfl: 3   promethazine (PHENERGAN) 25 MG tablet, Take 1 tablet (25 mg total) by mouth every 8 (eight) hours as needed for nausea or vomiting., Disp: 45 tablet, Rfl: 2   SUMAtriptan (IMITREX) 100 MG tablet, TAKE ONE TABLET BY MOUTH AT ONSET OF HEADACHE, THEN REPEAT IN 2 HOURS, Disp: 10 tablet, Rfl: 0   valACYclovir (VALTREX) 500 MG tablet, Take 1 tablet (500 mg total) by mouth daily., Disp: 30 tablet, Rfl: 1   Semaglutide,0.25 or 0.5MG/DOS, (OZEMPIC, 0.25 OR  0.5 MG/DOSE,) 2 MG/1.5ML SOPN, Inject 0.5 mg into the skin once a week., Disp: 4.5 mL, Rfl: 1   Allergies  Allergen Reactions   Sulfa Antibiotics Hives      The patient states she is post menopausal status.  Patient's last menstrual period was 02/20/2012.. Negative for Dysmenorrhea and Negative for Menorrhagia. Negative for: breast discharge, breast lump(s), breast pain and breast self exam. Associated symptoms include abnormal vaginal bleeding. Pertinent negatives include abnormal bleeding (hematology), anxiety, decreased libido, depression, difficulty falling sleep, dyspareunia, history of infertility, nocturia, sexual dysfunction, sleep disturbances, urinary incontinence, urinary urgency, vaginal discharge and vaginal itching. Diet regular; limiting breads and carbs. She is drinking protein shakes and vegetables. Eats increased amounts of oatmeal and cereal and fruit. The patient states her  exercise level is moderate with walking daily except of Sunday.   The patient's tobacco use is:  Social History   Tobacco Use  Smoking Status Never  Smokeless Tobacco Never  . She has been exposed to passive smoke. The patient's alcohol use is:  Social History   Substance and Sexual Activity  Alcohol Use Not Currently   Additional information: Last pap 2021, next one scheduled for 2024.    Review of Systems  Constitutional: Negative.   HENT: Negative.    Eyes: Negative.   Respiratory: Negative.    Cardiovascular: Negative.   Gastrointestinal:  Positive for constipation (She is having a bowel movement daily but does not feel like she is emptying completely).  Endocrine: Negative.   Genitourinary: Negative.   Musculoskeletal: Negative.   Skin: Negative.   Allergic/Immunologic: Negative.   Neurological: Negative.   Hematological: Negative.   Psychiatric/Behavioral: Negative.       Today's Vitals   07/11/22 1530  BP: 128/88  Pulse: 92  Temp: 98.1 F (36.7 C)  Weight: 216 lb 9.6 oz (98.2 kg)  Height: 5' 3"  (1.6 m)   Body mass index is 38.37 kg/m.  Wt Readings from Last 3 Encounters:  07/11/22 216 lb 9.6 oz (98.2 kg)  06/15/22 218 lb 6.4 oz (99.1 kg)  03/15/22 233 lb 8 oz (105.9 kg)    Objective:  Physical Exam Constitutional:      General: She is not in acute distress.    Appearance: Normal appearance. She is well-developed. She is obese.  HENT:     Head: Normocephalic and atraumatic.     Right Ear: Hearing, tympanic membrane, ear canal and external ear normal. There is no impacted cerumen.     Left Ear: Hearing, tympanic membrane, ear canal and external ear normal. There is no impacted cerumen.     Nose: Nose normal.     Mouth/Throat:     Mouth: Mucous membranes are moist.  Eyes:     General: Lids are normal.     Extraocular Movements: Extraocular movements intact.     Conjunctiva/sclera: Conjunctivae normal.     Pupils: Pupils are equal, round, and  reactive to light.     Funduscopic exam:    Right eye: No papilledema.        Left eye: No papilledema.  Neck:     Thyroid: No thyroid mass.     Vascular: No carotid bruit.  Cardiovascular:     Rate and Rhythm: Normal rate and regular rhythm.     Pulses: Normal pulses.     Heart sounds: Normal heart sounds. No murmur heard. Pulmonary:     Effort: Pulmonary effort is normal.     Breath sounds: Normal breath sounds.  Chest:     Chest wall: No mass.  Breasts:    Tanner Score is 5.     Right: Normal. No mass or tenderness.     Left: Normal. No mass or tenderness.  Abdominal:     General: Abdomen is flat. Bowel sounds are normal. There is no distension.     Palpations: Abdomen is soft.     Tenderness: There is no abdominal tenderness.  Genitourinary:    Comments: Not due for PAP Musculoskeletal:        General: No swelling. Normal range of motion.     Cervical back: Full passive range of motion without pain, normal range of motion and neck supple.     Right lower leg: No edema.     Left lower leg: No edema.  Lymphadenopathy:     Upper Body:     Right upper body: No supraclavicular, axillary or pectoral adenopathy.     Left upper body: No supraclavicular, axillary or pectoral adenopathy.  Skin:    General: Skin is warm and dry.     Capillary Refill: Capillary refill takes less than 2 seconds.  Neurological:     General: No focal deficit present.     Mental Status: She is alert and oriented to person, place, and time.     Cranial Nerves: No cranial nerve deficit.     Sensory: No sensory deficit.  Psychiatric:        Mood and Affect: Mood normal.        Behavior: Behavior normal.        Thought Content: Thought content normal.        Judgment: Judgment normal.         Assessment And Plan:     1. Encounter for annual health examination Behavior modifications discussed and diet history reviewed.   Pt will continue to exercise regularly and modify diet with low GI, plant  based foods and decrease intake of processed foods.  Recommend intake of daily multivitamin, Vitamin D, and calcium.  Recommend mammogram and colonoscopy for preventive screenings, as well as recommend immunizations that include influenza, TDAP, and Shingles  2. 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 Continue follow up with Bariatric provider. She is encouraged to strive for BMI less than 30 to decrease cardiac risk. Advised to aim for at least 150 minutes of exercise per week.  3. Diabetes mellitus type 2 in obese [facility]) Comments: Has not been in stock for the Ozempic will resend, previous history of diabetes HgbA1c up to 7 - EKG 12-Lead - POCT Urinalysis Dipstick (81002) - Microalbumin / Creatinine Urine Ratio - CMP14+EGFR - Hemoglobin A1c - Lipid panel - Semaglutide,0.25 or 0.5MG/DOS, (OZEMPIC, 0.25 OR 0.5 MG/DOSE,) 2 MG/1.5ML SOPN; Inject 0.5 mg into the skin once a week.  Dispense: 4.5 mL; Refill: 1  4. Essential hypertension Comments: Blood pressue is fairly controlled, systolic is slightly elevated encouraged to stay well hydrated and to increase physical activity. EKG done with NSR HR 72 - EKG 12-Lead - POCT Urinalysis Dipstick (81002) - Microalbumin / Creatinine Urine Ratio - CMP14+EGFR  5. Gastric bypass status for obesity Continue follow up with Bariatric provider - CBC  6. B12 deficiency - CBC  7. Right upper quadrant abdominal mass Comments: Firm area to upper right quadrant will check ultrasound unsure if has fibroids - US Abdomen Complete; Future  Patient was given opportunity to ask questions. Patient verbalized understanding of the plan and was able to repeat key elements of the plan. All questions were answered to their satisfaction.   [name], FNP   I, [name], FNP, have reviewed all documentation for this visit. The  documentation on 07/11/22 for the exam, diagnosis, procedures, and orders are all accurate and complete.   THE PATIENT IS ENCOURAGED TO PRACTICE SOCIAL DISTANCING DUE TO THE COVID-19 PANDEMIC.

## 2022-07-11 NOTE — Patient Instructions (Signed)

## 2022-07-12 ENCOUNTER — Other Ambulatory Visit: Payer: Self-pay | Admitting: Nurse Practitioner

## 2022-07-12 DIAGNOSIS — E1169 Type 2 diabetes mellitus with other specified complication: Secondary | ICD-10-CM

## 2022-07-12 LAB — MICROALBUMIN / CREATININE URINE RATIO
Creatinine, Urine: 195.2 mg/dL
Microalb/Creat Ratio: 31 mg/g creat — ABNORMAL HIGH (ref 0–29)
Microalbumin, Urine: 59.6 ug/mL

## 2022-07-12 LAB — CMP14+EGFR
ALT: 16 IU/L (ref 0–32)
AST: 24 IU/L (ref 0–40)
Albumin/Globulin Ratio: 1.3 (ref 1.2–2.2)
Albumin: 4.4 g/dL (ref 3.8–4.9)
Alkaline Phosphatase: 136 IU/L — ABNORMAL HIGH (ref 44–121)
BUN/Creatinine Ratio: 19 (ref 9–23)
BUN: 20 mg/dL (ref 6–24)
Bilirubin Total: 0.2 mg/dL (ref 0.0–1.2)
CO2: 23 mmol/L (ref 20–29)
Calcium: 9.8 mg/dL (ref 8.7–10.2)
Chloride: 102 mmol/L (ref 96–106)
Creatinine, Ser: 1.07 mg/dL — ABNORMAL HIGH (ref 0.57–1.00)
Globulin, Total: 3.5 g/dL (ref 1.5–4.5)
Glucose: 77 mg/dL (ref 70–99)
Potassium: 4.2 mmol/L (ref 3.5–5.2)
Sodium: 140 mmol/L (ref 134–144)
Total Protein: 7.9 g/dL (ref 6.0–8.5)
eGFR: 61 mL/min/{1.73_m2} (ref >59)

## 2022-07-12 LAB — LIPID PANEL
Chol/HDL Ratio: 2.8 ratio (ref 0.0–4.4)
Cholesterol, Total: 160 mg/dL (ref 100–199)
HDL: 58 mg/dL (ref >39)
LDL Chol Calc (NIH): 87 mg/dL (ref 0–99)
Triglycerides: 82 mg/dL (ref 0–149)
VLDL Cholesterol Cal: 15 mg/dL (ref 5–40)

## 2022-07-12 LAB — CBC
Hematocrit: 29.6 % — ABNORMAL LOW (ref 34.0–46.6)
Hemoglobin: 10.1 g/dL — ABNORMAL LOW (ref 11.1–15.9)
MCH: 31.4 pg (ref 26.6–33.0)
MCHC: 34.1 g/dL (ref 31.5–35.7)
MCV: 92 fL (ref 79–97)
Platelets: 451 10*3/uL — ABNORMAL HIGH (ref 150–450)
RBC: 3.22 x10E6/uL — ABNORMAL LOW (ref 3.77–5.28)
RDW: 12.5 % (ref 11.7–15.4)
WBC: 4.8 10*3/uL (ref 3.4–10.8)

## 2022-07-12 LAB — HEMOGLOBIN A1C
Est. average glucose Bld gHb Est-mCnc: 111 mg/dL
Hgb A1c MFr Bld: 5.5 % (ref 4.8–5.6)

## 2022-07-14 ENCOUNTER — Other Ambulatory Visit: Payer: Self-pay

## 2022-07-14 DIAGNOSIS — I10 Essential (primary) hypertension: Secondary | ICD-10-CM

## 2022-07-14 DIAGNOSIS — E669 Obesity, unspecified: Secondary | ICD-10-CM

## 2022-07-14 DIAGNOSIS — D508 Other iron deficiency anemias: Secondary | ICD-10-CM

## 2022-07-15 LAB — FERRITIN: Ferritin: 74 ng/mL (ref 15–150)

## 2022-07-15 LAB — IRON AND TIBC
Iron Saturation: 12 % — ABNORMAL LOW (ref 15–55)
Iron: 45 ug/dL (ref 27–159)
Total Iron Binding Capacity: 369 ug/dL (ref 250–450)
UIBC: 324 ug/dL (ref 131–425)

## 2022-07-15 LAB — SPECIMEN STATUS REPORT

## 2022-07-21 ENCOUNTER — Other Ambulatory Visit: Payer: Self-pay | Admitting: Nurse Practitioner

## 2022-07-21 ENCOUNTER — Ambulatory Visit
Admission: RE | Admit: 2022-07-21 | Discharge: 2022-07-21 | Disposition: A | Discharge status: Home / Self Care | Payer: 59 | Source: Ambulatory Visit | Attending: Nurse Practitioner | Admitting: Nurse Practitioner

## 2022-07-21 DIAGNOSIS — R1901 Right upper quadrant abdominal swelling, mass and lump: Secondary | ICD-10-CM

## 2022-07-21 DIAGNOSIS — R1903 Right lower quadrant abdominal swelling, mass and lump: Secondary | ICD-10-CM | POA: Diagnosis not present

## 2022-07-30 ENCOUNTER — Encounter: Payer: Self-pay | Admitting: Nurse Practitioner

## 2022-08-01 ENCOUNTER — Other Ambulatory Visit: Payer: Self-pay | Admitting: Nurse Practitioner

## 2022-08-01 DIAGNOSIS — R8290 Unspecified abnormal findings in urine: Secondary | ICD-10-CM

## 2022-08-01 NOTE — Progress Notes (Signed)
Order placed for repeat urinalysis

## 2022-09-15 LAB — HM MAMMOGRAPHY: HM Mammogram: NORMAL (ref 0–4)

## 2022-09-21 ENCOUNTER — Encounter: Payer: Self-pay | Admitting: Neurology

## 2022-09-21 ENCOUNTER — Ambulatory Visit: Payer: 59 | Admitting: Neurology

## 2022-09-21 VITALS — BP 152/92 | HR 48 | Ht 66.0 in | Wt 218.0 lb

## 2022-09-21 DIAGNOSIS — Z6835 Body mass index (BMI) 35.0-35.9, adult: Secondary | ICD-10-CM | POA: Diagnosis not present

## 2022-09-21 DIAGNOSIS — R001 Bradycardia, unspecified: Secondary | ICD-10-CM | POA: Diagnosis not present

## 2022-09-21 DIAGNOSIS — D508 Other iron deficiency anemias: Secondary | ICD-10-CM

## 2022-09-21 DIAGNOSIS — R0683 Snoring: Secondary | ICD-10-CM

## 2022-09-21 DIAGNOSIS — Z9884 Bariatric surgery status: Secondary | ICD-10-CM | POA: Diagnosis not present

## 2022-09-21 NOTE — Progress Notes (Addendum)
SLEEP MEDICINE CLINIC    Provider:  Melvyn Novas, MD  Primary Care Physician:  Amy Felts, FNP [address] [place] [address]     Referring Provider: Arnette Felts, Fnp [address] [place],  [place] [address]          Chief Complaint according to patient   Patient presents with:     New Patient (Initial Visit)     Follow up on hypersomnia, weight loss process, snoring.       HISTORY OF PRESENT ILLNESS:  Amy Blair is a 56 y.o.  African American female patient who I have seen on 09/21/2022 . 09-21-2022: patient underwent a revision of bariatric surgery, Roux and Y in 2010, this is how she lost weight, not on ozempic ( it was not covered). She is still struggling with sleep hygiene.  BMI is now 35, she lost 20 pounds since her last visit with me- 6 months ago.  Hypersomnia is improving alongside weight loss, now Epworth score of 11/ 24, FSS at 20/ 63 points.  Her husband stated she still snores, but he has not witnessed apnea. She is willing to be tested by H[facility]>    Update 06/15/2022 JM: Patient is being seen for follow-up visit after prior initial visit with Dr. Vickey Blair for hypersomnia 3 months ago.  Has lost approx 15 lbs since prior visit, current weight 218 lb with goal of 175 lbs. has been working on [employer] and exercise as well as use of Ozempic.  Does report some improvement of fatigue since prior visit.  She has not had any recent headaches.  Epworth Sleepiness Scale 8/24 (prior 10/24).  Fatigue severity scale 29/63 (prior 28/63).  No concerns at this time.   Seen initially 03-15-2022:upon referral from Amy Felts, NP  for a sleep evaluation/ consultation.  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  In 2010 she had picked her up her body weight at 400 pounds, lost about 50 pounds preoperatively before undergoing bariatric surgery and her current was a  gastric bypass Roux-en-Y.Marland Kitchen  She just had a revision last week.  She is still intends to lose another 50 or 60 pounds and was prescribed Ozempic, but the health plan did not cover it.    Sleep relevant medical history: Nocturia 1-2 , but she drinks a lot of liquids-  no Tonsillectomy, wisdom teeth removed.  Family medical /sleep history: 2 so sisters, one is her twin-  on CPAP with OSA. She is one of 8 kids, born over 8 years.   Social history: Patient is working as a group home,  and lives in a household with her friend-  lost her daughter in 2018.  Family status is single , with no living children, no  grandchildren.  The patient currently works Clinical cytogeneticist, 9 hours most days-  Tobacco use: none .  ETOH use ;none ,  Caffeine intake is now cut down- used to drink some Coffee( 1-2)- walking.  Hobbies : shopping and beach, arts and crafts, floristics, cooking.      Sleep habits are as follows: The patient's dinner time is between 5-7 PM.  Small meals, The patient goes to bed at 9-11 PM and continues to sleep for 6 hours, wakes for 2 bathroom breaks, the first time at 3 AM.   The preferred sleep position is  laterally, with the support  of 2-3 pillows. Protects from acid reflux.  She is reportedly a mild snorer.  Dreams are reportedly frequent/vivid.   6 AM is the usual rise time.  The patient wakes up spontaneously at 5.30 She reports not feeling refreshed or restored in AM, with symptoms such as dry mouth, slight morning headaches, and residual fatigue. Sips on water in the night.  Naps are taken infrequently, lasting from 15 to 30 minutes and are refreshing .    Review of Systems: Out of a complete 14 system review, the patient complains of only the following symptoms, and all other reviewed systems are negative.:  Fatigue, sleepiness , snoring, short sleeper.   Formerly super obese.    How likely are you to doze in the following situations: 0 = not likely, 1 = slight  chance, 2 = moderate chance, 3 = high chance   Sitting and Reading? Watching Television? Sitting inactive in a public place (theater or meeting)? As a passenger in a car for an hour without a break? Lying down in the afternoon when circumstances permit? Sitting and talking to someone? Sitting quietly after lunch without alcohol? In a car, while stopped for a few minutes in traffic?    09-21-2022 Total = 11 from 10/ 24 points , last visit.  FSS endorsed at  20 now, from 28 / 63 points.   Social History   Socioeconomic History   Marital status: Single    Spouse name: Not on file   Number of children: Not on file   Years of education: Not on file   Highest education level: Not on file  Occupational History   Not on file  Tobacco Use   Smoking status: Never   Smokeless tobacco: Never  Vaping Use   Vaping Use: Never used  Substance and Sexual Activity   Alcohol use: Not Currently   Drug use: No   Sexual activity: Yes    Comment: 1st intercourse 18 yo-5 partners  Other Topics Concern   Not on file  Social History Narrative   Right handed   Decaf coffee   Lives in a one story home.     Works with special needs adults.     Education: associates degree.    Daughter passed away at age 58/24/2018. Had rash and headaches, autopsy showed no cause.    Social Determinants of Health   Financial Resource Strain: Not on file  Food Insecurity: Not on file  Transportation Needs: Not on file  Physical Activity: Not on file  Stress: Not on file  Social Connections: Not on file    Family History  Problem Relation Age of Onset   Arthritis Mother    Cancer Mother        Cervix   Diabetes Mother    Hyperlipidemia Mother    Hypertension Mother    Diabetes Father    Heart disease Father    Cancer Sister        cervical   Diabetes Sister    Hypertension Sister    Diabetes Sister    Hypertension Sister    Breast cancer Maternal Aunt    Anesthesia problems Neg Hx     Past  Medical History:  Diagnosis Date   Anemia    HX   Arthritis    knees-a little   ASCUS with positive high risk HPV cervical 04-10-2018   Colposcopy inadequate but negative.  ECC showed LGSIL.   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   Hypertension     Past Surgical History:  Procedure Laterality Date   BARIATRIC SURGERY  03/03/2022   BREAST SURGERY     Reduction   CESAREAN SECTION     COSMETIC SURGERY     GASTRIC BYPASS  06/2009   GASTRIC BYPASS  03/03/2022   revision   LIPOSUCTION  04/05/2012   Procedure: LIPOSUCTION;  Surgeon: Cristine Polio, MD;  Location: [facility];  Service: Plastics;  Laterality: N/A;   PANNICULECTOMY  04/05/2012   Procedure: PANNICULECTOMY;  Surgeon: Cristine Polio, MD;  Location: [facility];  Service: Plastics;  Laterality: N/A;   panniculectomy  hernia repair  04/05/2012   REDUCTION MAMMAPLASTY Bilateral 04/28/2017   Torn quad reapair  66/5993   Right   UMBILICAL HERNIA REPAIR  04/05/2012   Procedure: HERNIA REPAIR UMBILICAL ADULT;  Surgeon: Cristine Polio, MD;  Location: [facility];  Service: Plastics;  Laterality: N/A;     Current Outpatient Medications on File Prior to Visit  Medication Sig Dispense Refill   cetirizine (ZYRTEC) 10 MG tablet Take 1 tablet (10 mg total) by mouth daily. 30 tablet 3   Cholecalciferol (VITAMIN D) 50 MCG (2000 UT) CAPS Take by mouth.     cyclobenzaprine (FLEXERIL) 10 MG tablet Take 1 tablet (10 mg total) by mouth 3 (three) times daily as needed for muscle spasms. 20 tablet 0   ibuprofen (ADVIL) 800 MG tablet Take 1 tablet (800 mg total) by mouth 3 (three) times daily. Do NOT take on an empty stomach.  Drink plenty of water with this medication 21 tablet 0   Lifitegrast (XIIDRA) 5 % SOLN Apply to eye.     linaclotide (LINZESS) 145 MCG CAPS capsule Take 1 capsule (145 mcg total) by mouth daily. 90 capsule 1   lisinopril-hydrochlorothiazide (ZESTORETIC)  20-25 MG tablet TAKE 1 TABLET BY MOUTH EVERY DAY 90 tablet 1   meloxicam (MOBIC) 7.5 MG tablet Take 1 tablet (7.5 mg total) by mouth daily. 30 tablet 1   Multiple Vitamins-Minerals (MULTIVITAMIN WITH MINERALS) tablet Bariatric vitamin 30 tablet 3   promethazine (PHENERGAN) 25 MG tablet Take 1 tablet (25 mg total) by mouth every 8 (eight) hours as needed for nausea or vomiting. 45 tablet 2   Semaglutide,0.25 or 0.5MG /DOS, (OZEMPIC, 0.25 OR 0.5 MG/DOSE,) 2 MG/3ML SOPN Inject 0.25 mg into the skin once a week. 9 mL 1   SUMAtriptan (IMITREX) 100 MG tablet TAKE ONE TABLET BY MOUTH AT ONSET OF HEADACHE, THEN REPEAT IN 2 HOURS 10 tablet 0   valACYclovir (VALTREX) 500 MG tablet Take 1 tablet (500 mg total) by mouth daily. 30 tablet 1   No current facility-administered medications on file prior to visit.    Allergies  Allergen Reactions   Sulfa Antibiotics Hives    Physical exam:  Today's Vitals   09/21/22 0911  BP: (!) 152/92  Pulse: (!) 48  Weight: 218 lb (98.9 kg)  Height: 5\' 6"  (1.676 m)   Body mass index is 35.19 kg/m.   Wt Readings from Last 3 Encounters:  09/21/22 218 lb (98.9 kg)  07/11/22 216 lb 9.6 oz (98.2 kg)  06/15/22 218 lb 6.4 oz (99.1 kg)     Ht Readings from Last 3 Encounters:  09/21/22 5\' 6"  (1.676 m)  07/11/22 5\' 3"  (1.6 m)  06/15/22 5\' 3"  (1.6 m)      General: The patient is awake, alert and appears  not in acute distress. The patient is well groomed. Head: Normocephalic, atraumatic. Neck is supple.  Mallampati 3 neck circumference:13 inches . Nasal airflow  patent.  Retrognathia is not seen.  Dental status: intact , 4 implanted top teeth.  Cardiovascular:  Regular rate and cardiac rhythm by pulse,  without distended neck veins. Respiratory: Lungs are clear to auscultation.  Skin:  Without evidence of ankle edema, nor rash. Trunk: The patient's posture is erect.   Neurologic exam : The patient is awake and alert, oriented to place and time.   Memory  subjective described as intact.  Attention span & concentration ability appears normal.  Speech is fluent,  without  dysarthria, dysphonia or aphasia.  Mood and affect are appropriate.   Cranial nerves: no loss of smell or taste reported  Pupils are equal and briskly reactive to light. Funduscopic exam deferred. .  Extraocular movements in vertical and horizontal planes were intact and without nystagmus. No Diplopia. Visual fields by finger perimetry are intact. Hearing was intact to soft voice and finger rubbing.    Facial sensation intact to fine touch.  Facial motor strength is symmetric and tongue and uvula move midline.  Neck ROM : rotation, tilt and flexion extension were normal for age and shoulder shrug was symmetrical.    Motor exam:  Symmetric bulk, tone and ROM.   Normal tone without cog- wheeling, symmetric grip strength .   Sensory:   normal.  Proprioception tested in the upper extremities was normal.   Coordination: Rapid alternating movements in the fingers/hands were of normal speed.  The Finger-to-nose maneuver was intact without evidence of ataxia, dysmetria or tremor.   Gait and station: Patient could rise unassisted from a seated position, walked without assistive device.  Stance is of normal width/ base  Toe and heel walk were deferred.  Deep tendon reflexes: in the  upper and lower extremities were symmetric and attenuated . Babinski response was deferred        After spending a total time of  20  minutes face to face and additional time for physical and neurologic examination, review of laboratory studies,  personal review of imaging studies, reports and results of other testing and review of referral information / records as far as provided in visit, I have established the following assessments:   Hypersomnia was improving alongside weight loss, now Epworth score of 11/ 24, FSS at 20/ 63 points.   Her husband stated she still snores, but he has not  witnessed apnea. She is willing to be tested by [facility]> the prvious health plan did not pay for a sleep test- now with [employer].   The patient had felt some fatigue some sleepiness and in the past sometimes had slight headaches when she wakes up in the morning.   There is always a consideration that she may not absorb all trace elements and vitamins after a gastric bypass surgery.   She has been anemic for many years now. She  remains bradycardic, which can exacerbate under untreated apnea.     My Plan is to proceed with:  1)  [facility], for evaluation of persistent snoring ( no apnea witnessed) , Hypersomnia, not so much fatigue, morning headaches and snoring.    I would like to thank Amy Felts, FNP and Amy Felts, Fnp [address] [place],  [place] [address] for allowing me to meet with and to take care of this pleasant patient.    [name] will follow up  with our NP within 2-4 months.   CC: I will share my notes with PCP.  Electronically signed by: Melvyn Novasarmen Ernisha Sorn, MD 09/21/2022 9:54 AM  [facility] and [facility] certified by The [employer] of [employer] Medicine and Diplomate of the [employer] of [employer]. Board certified In Neurology through the ABPN, Fellow of the [employer] of [employer]. Medical Director of [facility].

## 2022-10-10 ENCOUNTER — Encounter: Payer: Self-pay | Admitting: Nurse Practitioner

## 2022-10-17 ENCOUNTER — Ambulatory Visit: Payer: 59

## 2022-10-17 VITALS — BP 134/78 | HR 57 | Temp 97.6°F

## 2022-10-17 DIAGNOSIS — I10 Essential (primary) hypertension: Secondary | ICD-10-CM

## 2022-10-17 NOTE — Patient Instructions (Signed)
Hypertension, Adult High blood pressure (hypertension) is when the force of blood pumping through the arteries is too strong. The arteries are the blood vessels that carry blood from the heart throughout the body. Hypertension forces the heart to work harder to pump blood and may cause arteries to become narrow or stiff. Untreated or uncontrolled hypertension can lead to a heart attack, heart failure, a stroke, kidney disease, and other problems. A blood pressure reading consists of a higher number over a lower number. Ideally, your blood pressure should be below 120/80. The first ("top") number is called the systolic pressure. It is a measure of the pressure in your arteries as your heart beats. The second ("bottom") number is called the diastolic pressure. It is a measure of the pressure in your arteries as the heart relaxes. What are the causes? The exact cause of this condition is not known. There are some conditions that result in high blood pressure. What increases the risk? Certain factors may make you more likely to develop high blood pressure. Some of these risk factors are under your control, including: Smoking. Not getting enough exercise or physical activity. Being overweight. Having too much fat, sugar, calories, or salt (sodium) in your diet. Drinking too much alcohol. Other risk factors include: Having a personal history of heart disease, diabetes, high cholesterol, or kidney disease. Stress. Having a family history of high blood pressure and high cholesterol. Having obstructive sleep apnea. Age. The risk increases with age. What are the signs or symptoms? High blood pressure may not cause symptoms. Very high blood pressure (hypertensive crisis) may cause: Headache. Fast or irregular heartbeats (palpitations). Shortness of breath. Nosebleed. Nausea and vomiting. Vision changes. Severe chest pain, dizziness, and seizures. How is this diagnosed? This condition is diagnosed by  measuring your blood pressure while you are seated, with your arm resting on a flat surface, your legs uncrossed, and your feet flat on the floor. The cuff of the blood pressure monitor will be placed directly against the skin of your upper arm at the level of your heart. Blood pressure should be measured at least twice using the same arm. Certain conditions can cause a difference in blood pressure between your right and left arms. If you have a high blood pressure reading during one visit or you have normal blood pressure with other risk factors, you may be asked to: Return on a different day to have your blood pressure checked again. Monitor your blood pressure at home for 1 week or longer. If you are diagnosed with hypertension, you may have other blood or imaging tests to help your health care provider understand your overall risk for other conditions. How is this treated? This condition is treated by making healthy lifestyle changes, such as eating healthy foods, exercising more, and reducing your alcohol intake. You may be referred for counseling on a healthy diet and physical activity. Your health care provider may prescribe medicine if lifestyle changes are not enough to get your blood pressure under control and if: Your systolic blood pressure is above 130. Your diastolic blood pressure is above 80. Your personal target blood pressure may vary depending on your medical conditions, your age, and other factors. Follow these instructions at home: Eating and drinking  Eat a diet that is high in fiber and potassium, and low in sodium, added sugar, and fat. An example of this eating plan is called the DASH diet. DASH stands for Dietary Approaches to Stop Hypertension. To eat this way: Eat   plenty of fresh fruits and vegetables. Try to fill one half of your plate at each meal with fruits and vegetables. Eat whole grains, such as whole-wheat pasta, brown rice, or whole-grain bread. Fill about one  fourth of your plate with whole grains. Eat or drink low-fat dairy products, such as skim milk or low-fat yogurt. Avoid fatty cuts of meat, processed or cured meats, and poultry with skin. Fill about one fourth of your plate with lean proteins, such as fish, chicken without skin, beans, eggs, or tofu. Avoid pre-made and processed foods. These tend to be higher in sodium, added sugar, and fat. Reduce your daily sodium intake. Many people with hypertension should eat less than 1,500 mg of sodium a day. Do not drink alcohol if: Your health care provider tells you not to drink. You are pregnant, may be pregnant, or are planning to become pregnant. If you drink alcohol: Limit how much you have to: 0-1 drink a day for women. 0-2 drinks a day for men. Know how much alcohol is in your drink. In the [place] one drink equals one 12 oz bottle of beer (355 mL), one 5 oz glass of wine (148 mL), or one 1 oz glass of hard liquor (44 mL). Lifestyle  Work with your health care provider to maintain a healthy body weight or to lose weight. Ask what an ideal weight is for you. Get at least 30 minutes of exercise that causes your heart to beat faster (aerobic exercise) most days of the week. Activities may include walking, swimming, or biking. Include exercise to strengthen your muscles (resistance exercise), such as Pilates or lifting weights, as part of your weekly exercise routine. Try to do these types of exercises for 30 minutes at least 3 days a week. Do not use any products that contain nicotine or tobacco. These products include cigarettes, chewing tobacco, and vaping devices, such as e-cigarettes. If you need help quitting, ask your health care provider. Monitor your blood pressure at home as told by your health care provider. Keep all follow-up visits. This is important. Medicines Take over-the-counter and prescription medicines only as told by your health care provider. Follow directions carefully. Blood  pressure medicines must be taken as prescribed. Do not skip doses of blood pressure medicine. Doing this puts you at risk for problems and can make the medicine less effective. Ask your health care provider about side effects or reactions to medicines that you should watch for. Contact a health care provider if you: Think you are having a reaction to a medicine you are taking. Have headaches that keep coming back (recurring). Feel dizzy. Have swelling in your ankles. Have trouble with your vision. Get help right away if you: Develop a severe headache or confusion. Have unusual weakness or numbness. Feel faint. Have severe pain in your chest or abdomen. Vomit repeatedly. Have trouble breathing. These symptoms may be an emergency. Get help right away. Call 911. Do not wait to see if the symptoms will go away. Do not drive yourself to the hospital. Summary Hypertension is when the force of blood pumping through your arteries is too strong. If this condition is not controlled, it may put you at risk for serious complications. Your personal target blood pressure may vary depending on your medical conditions, your age, and other factors. For most people, a normal blood pressure is less than 120/80. Hypertension is treated with lifestyle changes, medicines, or a combination of both. Lifestyle changes include losing weight, eating a healthy,   low-sodium diet, exercising more, and limiting alcohol. This information is not intended to replace advice given to you by your health care provider. Make sure you discuss any questions you have with your health care provider. Document Revised: 10/11/2021 Document Reviewed: 10/11/2021 Elsevier Patient Education  2023 Elsevier Inc.  

## 2022-10-17 NOTE — Progress Notes (Signed)
Patient presents today for BPC. She states that her BP was elevated at her neurology appointment. She is currently taking lisinopril/hctz 20-25mg  BP Readings from Last 3 Encounters:  10/17/22 134/78  09/21/22 (!) 152/92  07/11/22 128/88   Patient will follow up with provider as scheduled.

## 2022-11-14 ENCOUNTER — Ambulatory Visit: Payer: 59 | Admitting: Nurse Practitioner

## 2022-11-21 ENCOUNTER — Telehealth: Payer: Self-pay | Admitting: Neurology

## 2022-11-21 NOTE — Telephone Encounter (Signed)
HST- [employer] no auth req spoke to [employer]. Ref # [id number].  Patient is scheduled at [facility] for 12/05/22 at 9:30 Am.  Mailed packet to the patient.

## 2022-12-05 ENCOUNTER — Ambulatory Visit: Payer: 59 | Admitting: Neurology

## 2022-12-05 DIAGNOSIS — G4733 Obstructive sleep apnea (adult) (pediatric): Secondary | ICD-10-CM

## 2022-12-05 DIAGNOSIS — Z9884 Bariatric surgery status: Secondary | ICD-10-CM

## 2022-12-05 DIAGNOSIS — R0683 Snoring: Secondary | ICD-10-CM

## 2022-12-05 DIAGNOSIS — Z6835 Body mass index (BMI) 35.0-35.9, adult: Secondary | ICD-10-CM

## 2022-12-05 DIAGNOSIS — R001 Bradycardia, unspecified: Secondary | ICD-10-CM

## 2022-12-05 DIAGNOSIS — D508 Other iron deficiency anemias: Secondary | ICD-10-CM

## 2022-12-06 NOTE — Progress Notes (Signed)
See procedure note.

## 2022-12-08 ENCOUNTER — Telehealth: Payer: Self-pay | Admitting: Neurology

## 2022-12-08 NOTE — Procedures (Signed)
   [facility]  HOME SLEEP TEST (Watch PAT) REPORT  STUDY DATE: 12/05/2022  DOB: [birth year]  MRN: [id number]  ORDERING CLINICIAN: Huston Foley, MD, PhD - study interpreted on behalf of Dr. Vickey Huger   REFERRING CLINICIAN: Arnette Felts, FNP   CLINICAL INFORMATION/HISTORY: 56 year old female with an underlying medical history of obesity with status post gastric bypass, vitamin B12 deficiency, bradycardia, migraine headaches, allergic rhinitis, anemia, hypertension, and constipation, who reports snoring and excessive daytime somnolence.  Epworth sleepiness score: 11/24.  BMI: 35.1 kg/m  FINDINGS:   Sleep Summary:   Total Recording Time (hours, min): 8 hours, 14 min  Total Sleep Time (hours, min):  7 hours, 48 min  Percent REM (%):    13.1%   Respiratory Indices:   Calculated pAHI (per hour):  10/hour         REM pAHI:    15.5/hour       NREM pAHI: 9.1/hour  Central pAHI: 0/hour  Oxygen Saturation Statistics:    Oxygen Saturation (%) Mean: 96%   Minimum oxygen saturation (%):                 93%   O2 Saturation Range (%): 93-100%    O2 Saturation (minutes) <=88%: 0 min  Pulse Rate Statistics:   Pulse Mean (bpm):    50/min    Pulse Range (34-83/min)   IMPRESSION: OSA (obstructive sleep apnea), mild Bradycardia  RECOMMENDATION:  This home sleep test demonstrates overall mild obstructive sleep apnea - by number of events - with a total AHI of 10/hour and O2 nadir of 93% (very minimal desaturations). Snoring was detected, and appeared to be intermittent, mostly in the mild range, at times moderate, rarely in the louder range.  Treatment with a positive airway pressure device is not imperative.  For disturbing snoring, an oral appliance can be considered in appropriate candidates.  Ongoing weight loss may aid in reducing her mild sleep disordered breathing and her snoring. The patient should be cautioned not to drive, work at heights, or operate  dangerous or heavy equipment when tired or sleepy. Review and reiteration of good sleep hygiene measures should be pursued with any patient. Other causes of the patient's symptoms, including circadian rhythm disturbances, an underlying mood disorder, medication effect and/or an underlying medical problem cannot be ruled out based on this test. Clinical correlation is recommended.  The patient and her referring provider will be notified of the test results. The patient will be seen in follow up in sleep clinic at [facility], as necessary.  I certify that I have reviewed the raw data recording prior to the issuance of this report in accordance with the standards of the American Academy of Sleep Medicine (AASM).  INTERPRETING PHYSICIAN:   Huston Foley, MD, PhD Medical Director, [facility]) Diplomat, ABPN (Neurology and Sleep)   [facility] [address] [place], [place] [address] [phone]

## 2022-12-08 NOTE — Telephone Encounter (Signed)
This patient saw Dr. Vickey Blair for sleep evaluation on 09/21/2022.  I read the HST from 12/05/2022 on Dr. Oliva Blair behalf:  HST did not show any significant sleep apnea, mild by AHI criteria.  She had a total AHI of 10/hour and O2 nadir of 93% (very minimal desaturations). Snoring was detected, and appeared to be intermittent, mostly in the mild range, at times moderate, rarely in the louder range. Treatment with a positive airway pressure device, such as CPAP or AutoPap is not imperative. For disturbing snoring, an oral appliance can be considered in appropriate candidates. Ongoing weight loss may aid in reducing her mild sleep disordered breathing and her snoring.  If she would like a referral to dentistry, we can certainly facilitate. I would not recommend a CPAP machine.  She can follow-up with her PCP at this point.  She can follow-up with Dr. Vickey Blair as needed.

## 2022-12-12 NOTE — Telephone Encounter (Signed)
Called pt at [phone]. Called and spoke with pt about results per. Athar's note. Pt verbalized understanding. She will conitnue to try and lose weight. Will f/u with PCP moving forward and Dr. Vickey Huger prn.

## 2023-01-07 ENCOUNTER — Other Ambulatory Visit: Payer: Self-pay

## 2023-01-07 ENCOUNTER — Emergency Department (HOSPITAL_BASED_OUTPATIENT_CLINIC_OR_DEPARTMENT_OTHER)
Admission: EM | Admit: 2023-01-07 | Discharge: 2023-01-07 | Disposition: A | Discharge status: Home / Self Care | Payer: 59 | Attending: Medical | Admitting: Medical

## 2023-01-07 ENCOUNTER — Encounter (HOSPITAL_BASED_OUTPATIENT_CLINIC_OR_DEPARTMENT_OTHER): Payer: Self-pay | Admitting: Emergency Medicine

## 2023-01-07 DIAGNOSIS — B9789 Other viral agents as the cause of diseases classified elsewhere: Secondary | ICD-10-CM | POA: Diagnosis not present

## 2023-01-07 DIAGNOSIS — Z1152 Encounter for screening for COVID-19: Secondary | ICD-10-CM | POA: Insufficient documentation

## 2023-01-07 DIAGNOSIS — I10 Essential (primary) hypertension: Secondary | ICD-10-CM | POA: Diagnosis not present

## 2023-01-07 DIAGNOSIS — Z79899 Other long term (current) drug therapy: Secondary | ICD-10-CM | POA: Diagnosis not present

## 2023-01-07 DIAGNOSIS — J329 Chronic sinusitis, unspecified: Secondary | ICD-10-CM | POA: Insufficient documentation

## 2023-01-07 DIAGNOSIS — R030 Elevated blood-pressure reading, without diagnosis of hypertension: Secondary | ICD-10-CM

## 2023-01-07 DIAGNOSIS — R0981 Nasal congestion: Secondary | ICD-10-CM | POA: Diagnosis not present

## 2023-01-07 DIAGNOSIS — J019 Acute sinusitis, unspecified: Secondary | ICD-10-CM | POA: Diagnosis not present

## 2023-01-07 LAB — RESP PANEL BY RT-PCR (RSV, FLU A&B, COVID)  RVPGX2
Influenza A by PCR: NEGATIVE
Influenza B by PCR: NEGATIVE
Resp Syncytial Virus by PCR: NEGATIVE
SARS Coronavirus 2 by RT PCR: NEGATIVE

## 2023-01-07 NOTE — ED Provider Notes (Signed)
[facility] Provider Note   CSN: [id number] Arrival date & time: 01/07/23  0848     History  No chief complaint on file.   Amy Blair is a 57 y.o. female history of hypertension.  Also reports history of gastric bypass formally diabetic but now this is resolved per patient.  Patient presents the emergency department today for sinus pressure ongoing x 4 days.  She describes her pain as a pressure which is constant mild and does not radiate.  Symptoms somewhat worsened with palpation of her face and pain improved somewhat with Mucinex and over-the-counter medications.  She reports associated with a clear rhinorrhea.  On my evaluation patient denies history of cough.  She denies nausea, vomiting, vision change, trouble swallowing, sore throat, fever/chills or any additional concerns.  HPI     Home Medications Prior to Admission medications   Medication Sig Start Date End Date Taking? Authorizing Provider  cetirizine (ZYRTEC) 10 MG tablet Take 1 tablet (10 mg total) by mouth daily. 06/07/15   Alycia Rossetti, MD  Cholecalciferol (VITAMIN D) 50 MCG (2000 UT) CAPS Take by mouth.    [provider]  cyclobenzaprine (FLEXERIL) 10 MG tablet Take 1 tablet (10 mg total) by mouth 3 (three) times daily as needed for muscle spasms. 08/10/20   Alycia Rossetti, MD  ibuprofen (ADVIL) 800 MG tablet Take 1 tablet (800 mg total) by mouth 3 (three) times daily. Do NOT take on an empty stomach.  Drink plenty of water with this medication 11/01/19   Harrie Foreman, MD  Lifitegrast Shirley Friar) 5 % SOLN Apply to eye.    [provider]  linaclotide (LINZESS) 145 MCG CAPS capsule Take 1 capsule (145 mcg total) by mouth daily. 01/24/21   Alycia Rossetti, MD  lisinopril-hydrochlorothiazide (ZESTORETIC) 20-25 MG tablet TAKE 1 TABLET BY MOUTH EVERY DAY 07/12/22   [name], FNP  meloxicam (MOBIC) 7.5 MG tablet Take 1 tablet (7.5 mg total)  by mouth daily. 01/24/21   Alycia Rossetti, MD  Multiple Vitamins-Minerals (MULTIVITAMIN WITH MINERALS) tablet Bariatric vitamin 01/24/21   Alycia Rossetti, MD  promethazine (PHENERGAN) 25 MG tablet Take 1 tablet (25 mg total) by mouth every 8 (eight) hours as needed for nausea or vomiting. 08/13/18   Alycia Rossetti, MD  Semaglutide,0.25 or 0.5MG /DOS, (OZEMPIC, 0.25 OR 0.5 MG/DOSE,) 2 MG/3ML SOPN Inject 0.25 mg into the skin once a week. 07/13/22   [name], FNP  SUMAtriptan (IMITREX) 100 MG tablet TAKE ONE TABLET BY MOUTH AT ONSET OF HEADACHE, THEN REPEAT IN 2 HOURS 01/24/21   Festus, Modena Nunnery, MD  valACYclovir (VALTREX) 500 MG tablet Take 1 tablet (500 mg total) by mouth daily. 01/24/21   Alycia Rossetti, MD      Allergies    Sulfa antibiotics    Review of Systems   Review of Systems  Constitutional:  Negative for chills and fever.  HENT:  Positive for sinus pressure. Negative for facial swelling, sore throat and trouble swallowing.   Respiratory: Negative.  Negative for cough.     Physical Exam Updated Vital Signs BP (!) 160/98   Pulse (!) 55   Temp 98.1 F (36.7 C) (Oral)   Resp 20   LMP 02/20/2012   SpO2 100%  Physical Exam Constitutional:      General: She is not in acute distress.    Appearance: Normal appearance. She is well-developed. She is not ill-appearing or diaphoretic.  HENT:     Head: Normocephalic and atraumatic.     Jaw: There is normal jaw occlusion. No trismus.     Comments: The patient has normal phonation and is in control of secretions. No stridor.  Midline uvula without edema. Soft palate rises symmetrically.  No tonsillar erythema, swelling or exudates. Tongue protrusion is normal, floor of mouth is soft. No trismus. No creptius on neck palpation. No gingival erythema or fluctuance noted. Mucus membranes moist. No pallor noted.    Right Ear: Tympanic membrane and external ear normal.     Left Ear: Tympanic membrane and external ear normal.     Nose:  Rhinorrhea present. Rhinorrhea is clear.     Right Sinus: Maxillary sinus tenderness and frontal sinus tenderness present.     Left Sinus: Maxillary sinus tenderness and frontal sinus tenderness present.     Mouth/Throat:     Lips: Pink.     Mouth: Mucous membranes are moist.     Pharynx: Oropharynx is clear.  Eyes:     General: Vision grossly intact. Gaze aligned appropriately.     Pupils: Pupils are equal, round, and reactive to light.  Neck:     Trachea: Trachea and phonation normal.  Pulmonary:     Effort: Pulmonary effort is normal. No respiratory distress.  Abdominal:     General: There is no distension.     Palpations: Abdomen is soft.     Tenderness: There is no abdominal tenderness. There is no guarding or rebound.  Musculoskeletal:        General: Normal range of motion.     Cervical back: Normal range of motion.  Skin:    General: Skin is warm and dry.  Neurological:     Mental Status: She is alert.     GCS: GCS eye subscore is 4. GCS verbal subscore is 5. GCS motor subscore is 6.     Comments: Speech is clear and goal oriented, follows commands Major Cranial nerves without deficit, no facial droop Moves extremities without ataxia, coordination intact  Psychiatric:        Behavior: Behavior normal.     ED Results / Procedures / Treatments   Labs (all labs ordered are listed, but only abnormal results are displayed) Labs Reviewed  RESP PANEL BY RT-PCR (RSV, FLU A&B, COVID)  [id number]    EKG None  Radiology No results found.  Procedures Procedures    Medications Ordered in ED Medications - No data to display  ED Course/ Medical Decision Making/ A&P                             Medical Decision Making 57 year old female presented for facial pressure x 4 days.  She reports some mild clear rhinorrhea as well.  No injury or trauma.  No cough or sore throat.  No other symptoms.  She is well-appearing and in no acute distress.  Cranial nerves grossly intact.   Bilateral TMs clear.  She is mildly tender to the maxillary and frontal sinuses.  No minimal clear nasal discharge.  As symptoms have been only present for 4 days there is no indication for antibiotics at this time.  Low suspicion for acute bacterial rhinosinusitis, suspect viral URI.  Patient instructed on home symptomatic therapies.  She reports she has a primary care provider, tried internal medicine I encouraged her to call them to schedule follow-up appointment she is actually scheduled an appointment now on  line for checkup next week.  I discussed strict ER precautions with the patient today.  No other send suggest bacterial sinusitis, RPA, PTA, Ludwick's, dental abscess or other emergent pathologies at this time.  No indication for blood work or imaging.  Triage had ordered a COVID/flu/RSV panel which is negative.   Instantly patient's blood pressure slightly elevated 160/98 in triage.  She is encouraged to follow-up with her PCP for recheck this week.  No evidence for hypertensive urgency/emergency at this time.  At this time there does not appear to be any evidence of an acute emergency medical condition and the patient appears stable for discharge with appropriate outpatient follow up. Diagnosis was discussed with patient who verbalizes understanding of care plan and is agreeable to discharge. I have discussed return precautions with patient who verbalizes understanding. Patient encouraged to follow-up with their PCP. All questions answered.    Note: Portions of this report may have been transcribed using voice recognition software. Every effort was made to ensure accuracy; however, inadvertent computerized transcription errors may still be present.         Final Clinical Impression(s) / ED Diagnoses Final diagnoses:  Viral sinusitis  Elevated blood pressure reading    Rx / DC Orders ED Discharge Orders     None         Gari Crown 01/07/23 1100     Fredia Sorrow, MD 01/10/23 1902

## 2023-01-07 NOTE — ED Triage Notes (Signed)
Congested, cough, sinus pressure to right side of face and teeth, for 4 days, has tried OTC but no relief.

## 2023-01-07 NOTE — Discharge Instructions (Signed)
At this time there does not appear to be the presence of an emergent medical condition, however there is always the potential for conditions to change. Please read and follow the below instructions.  Please return to the Emergency Department immediately for any new or worsening symptoms or if your symptoms do not improve within 3 days. Please be sure to follow up with your Primary Care Provider within one week regarding your visit today; please call their office to schedule an appointment even if you are feeling better for a follow-up visit. Please have your blood pressure level rechecked in the next few days by your primary care provider.  If you develop any chest pain, trouble breathing, headache, vision changes, numbness, tingling or any other concerning symptoms please call 911 and return immediately to the emergency department.   Please read the additional information packets attached to your discharge summary.  Go to the nearest Emergency Department immediately if: You have fever or chills You have a very bad headache. You cannot stop vomiting. You have very bad pain or swelling around your face or eyes. You have trouble seeing. You feel confused. Your neck is stiff. You have trouble breathing. You have any new/concerning or worsening of symptoms.  Do not take your medicine if  develop an itchy rash, swelling in your mouth or lips, or difficulty breathing; call 911 and seek immediate emergency medical attention if this occurs.  You may review your lab tests and imaging results in their entirety on your MyChart account.  Please discuss all results of fully with your primary care provider and other specialist at your follow-up visit.  Note: Portions of this text may have been transcribed using voice recognition software. Every effort was made to ensure accuracy; however, inadvertent computerized transcription errors may still be present.

## 2023-01-08 ENCOUNTER — Encounter: Payer: Self-pay | Admitting: Nurse Practitioner

## 2023-01-08 ENCOUNTER — Telehealth: Payer: Self-pay

## 2023-01-08 ENCOUNTER — Ambulatory Visit (INDEPENDENT_AMBULATORY_CARE_PROVIDER_SITE_OTHER): Payer: 59 | Admitting: Nurse Practitioner

## 2023-01-08 VITALS — BP 126/84 | HR 64 | Temp 98.3°F | Ht 66.0 in | Wt 214.6 lb

## 2023-01-08 DIAGNOSIS — Z6834 Body mass index (BMI) 34.0-34.9, adult: Secondary | ICD-10-CM

## 2023-01-08 DIAGNOSIS — E1169 Type 2 diabetes mellitus with other specified complication: Secondary | ICD-10-CM | POA: Diagnosis not present

## 2023-01-08 DIAGNOSIS — E1159 Type 2 diabetes mellitus with other circulatory complications: Secondary | ICD-10-CM

## 2023-01-08 DIAGNOSIS — B9789 Other viral agents as the cause of diseases classified elsewhere: Secondary | ICD-10-CM

## 2023-01-08 DIAGNOSIS — I10 Essential (primary) hypertension: Secondary | ICD-10-CM

## 2023-01-08 DIAGNOSIS — I152 Hypertension secondary to endocrine disorders: Secondary | ICD-10-CM | POA: Diagnosis not present

## 2023-01-08 DIAGNOSIS — E6609 Other obesity due to excess calories: Secondary | ICD-10-CM

## 2023-01-08 DIAGNOSIS — Z09 Encounter for follow-up examination after completed treatment for conditions other than malignant neoplasm: Secondary | ICD-10-CM

## 2023-01-08 DIAGNOSIS — R0981 Nasal congestion: Secondary | ICD-10-CM

## 2023-01-08 DIAGNOSIS — J329 Chronic sinusitis, unspecified: Secondary | ICD-10-CM | POA: Diagnosis not present

## 2023-01-08 DIAGNOSIS — D508 Other iron deficiency anemias: Secondary | ICD-10-CM | POA: Diagnosis not present

## 2023-01-08 DIAGNOSIS — E118 Type 2 diabetes mellitus with unspecified complications: Secondary | ICD-10-CM | POA: Diagnosis not present

## 2023-01-08 MED ORDER — CETIRIZINE HCL 10 MG PO TABS: 10.0000 mg | ORAL_TABLET | Freq: Every day | ORAL | 3 refills | Status: DC

## 2023-01-08 MED ORDER — NOREL AD 4-10-325 MG PO TABS: 1.0000 | ORAL_TABLET | Freq: Two times a day (BID) | ORAL | 0 refills | Status: AC | PRN

## 2023-01-08 NOTE — Telephone Encounter (Signed)
Transition Care Management Follow-up Telephone Call Date of discharge and from where: 01/07/2023 [place]  How have you been since you were released from the hospital? Pt states she is alright, she is still experiencing the aching feeling.   Any questions or concerns? No  Items Reviewed: Did the pt receive and understand the discharge instructions provided? Yes  Medications obtained and verified? Yes  Other? Yes  Any new allergies since your discharge? No  Dietary orders reviewed? Yes Do you have support at home? Yes   Home Care and Equipment/Supplies: Were home health services ordered? no If so, what is the name of the agency? N/a  Has the agency set up a time to come to the patient's home? no Were any new equipment or medical supplies ordered?  No What is the name of the medical supply agency? N/a Were you able to get the supplies/equipment? no Do you have any questions related to the use of the equipment or supplies? No  Functional Questionnaire: (I = Independent and D = Dependent) ADLs: i  Bathing/Dressing- i  Meal Prep- i  Eating- i  Maintaining continence- i  Transferring/Ambulation- i  Managing Meds- i  Follow up appointments reviewed:  PCP Hospital f/u appt confirmed? Yes  Scheduled to see [name]  on today @ n/a. [facility] f/u appt confirmed? No  Scheduled to see n/a on n/a @ n/a. Are transportation arrangements needed? No  If their condition worsens, is the pt aware to call PCP or go to the Emergency Dept.? Yes Was the patient provided with contact information for the PCP's office or ED? Yes Was to pt encouraged to call back with questions or concerns? Yes

## 2023-01-08 NOTE — Progress Notes (Addendum)
I,Amy Blair,acting as a Education administrator for Amy Brine, FNP.,have documented all relevant documentation on the behalf of Amy Brine, FNP,as directed by  Amy Brine, FNP while in the presence of Amy Blair, Amy Blair.    Subjective:     Patient ID: Amy Blair , female    DOB: 13-Sep-1966 , 57 y.o.   MRN: [id number]   Chief Complaint  Patient presents with   Follow-up   Hypertension   Diabetes    HPI  Patient presents today after visit to the ed on 01/07/2023.  She states having pain on the right side of her face. Along with sinus pressure. Symptoms started on Friday with pressure and drainage, then she began having tooth pain. She took an 800 mg ibuprofen was still congested the next morning. Cleared a little bit then came back. She had a respiratory panel done which was negative. She has been using flonase. She has not tried a nasal rinse. She continues with tooth pain. She does have a dental appt the first of February.  She is using Flonase at home daily.    Wt Readings from Last 3 Encounters: 01/08/23 : 214 lb 9.6 oz (97.3 kg) 09/21/22 : 218 lb (98.9 kg) 07/11/22 : 216 lb 9.6 oz (98.2 kg)       Past Medical History:  Diagnosis Date   Anemia    HX   Arthritis    knees-a little   ASCUS with positive high risk HPV cervical 03/2018   Colposcopy inadequate but negative.  ECC showed LGSIL.   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   Hypertension      Family History  Problem Relation Age of Onset   Arthritis Mother    Cancer Mother        Cervix   Diabetes Mother    Hyperlipidemia Mother    Hypertension Mother    Diabetes Father    Heart disease Father    Cancer Sister        cervical   Diabetes Sister    Hypertension Sister    Diabetes Sister    Hypertension Sister    Breast cancer Maternal Aunt    Anesthesia problems Neg Hx      Current Outpatient Medications:    Cholecalciferol (VITAMIN D) 50 MCG (2000 UT) CAPS,  Take by mouth., Disp: , Rfl:    cyclobenzaprine (FLEXERIL) 10 MG tablet, Take 1 tablet (10 mg total) by mouth 3 (three) times daily as needed for muscle spasms., Disp: 20 tablet, Rfl: 0   ibuprofen (ADVIL) 800 MG tablet, Take 1 tablet (800 mg total) by mouth 3 (three) times daily. Do NOT take on an empty stomach.  Drink plenty of water with this medication, Disp: 21 tablet, Rfl: 0   Lifitegrast (XIIDRA) 5 % SOLN, Apply to eye., Disp: , Rfl:    linaclotide (LINZESS) 145 MCG CAPS capsule, Take 1 capsule (145 mcg total) by mouth daily., Disp: 90 capsule, Rfl: 1   lisinopril-hydrochlorothiazide (ZESTORETIC) 20-25 MG tablet, TAKE 1 TABLET BY MOUTH EVERY DAY, Disp: 90 tablet, Rfl: 1   meloxicam (MOBIC) 7.5 MG tablet, Take 1 tablet (7.5 mg total) by mouth daily., Disp: 30 tablet, Rfl: 1   Multiple Vitamins-Minerals (MULTIVITAMIN WITH MINERALS) tablet, Bariatric vitamin, Disp: 30 tablet, Rfl: 3   NOREL AD 4-10-325 MG TABS, Take 1 tablet by mouth 2 (two) times daily as needed., Disp: 84 tablet, Rfl: 0   promethazine (  PHENERGAN) 25 MG tablet, Take 1 tablet (25 mg total) by mouth every 8 (eight) hours as needed for nausea or vomiting., Disp: 45 tablet, Rfl: 2   Semaglutide,0.25 or 0.'5MG'$ /DOS, (OZEMPIC, 0.25 OR 0.5 MG/DOSE,) 2 MG/3ML SOPN, Inject 0.25 mg into the skin once a week., Disp: 9 mL, Rfl: 1   SUMAtriptan (IMITREX) 100 MG tablet, TAKE ONE TABLET BY MOUTH AT ONSET OF HEADACHE, THEN REPEAT IN 2 HOURS, Disp: 10 tablet, Rfl: 0   valACYclovir (VALTREX) 500 MG tablet, Take 1 tablet (500 mg total) by mouth daily., Disp: 30 tablet, Rfl: 1   cetirizine (ZYRTEC) 10 MG tablet, Take 1 tablet (10 mg total) by mouth daily., Disp: 30 tablet, Rfl: 3   Allergies  Allergen Reactions   Sulfa Antibiotics Hives     Review of Systems  Constitutional: Negative.   Respiratory: Negative.    Cardiovascular: Negative.   Neurological: Negative.   Psychiatric/Behavioral: Negative.       Today's Vitals   01/08/23 1206   BP: 126/84  Pulse: 64  Temp: 98.3 F (36.8 C)  SpO2: 98%  Weight: 214 lb 9.6 oz (97.3 kg)  Height: '5\' 6"'$  (1.676 m)   Body mass index is 34.64 kg/m.  Wt Readings from Last 3 Encounters:  01/08/23 214 lb 9.6 oz (97.3 kg)  09/21/22 218 lb (98.9 kg)  07/11/22 216 lb 9.6 oz (98.2 kg)    Objective:  Physical Exam Vitals reviewed.  Constitutional:      General: She is not in acute distress.    Appearance: Normal appearance. She is well-developed. She is obese.  HENT:     Right Ear: Ear canal and external ear normal. There is no impacted cerumen. Tympanic membrane is bulging.     Left Ear: Ear canal and external ear normal. There is no impacted cerumen. Tympanic membrane is bulging.  Eyes:     Pupils: Pupils are equal, round, and reactive to light.  Cardiovascular:     Rate and Rhythm: Normal rate and regular rhythm.     Pulses: Normal pulses.     Heart sounds: Normal heart sounds. No murmur heard. Pulmonary:     Effort: Pulmonary effort is normal. No respiratory distress.     Breath sounds: Normal breath sounds. No wheezing.  Musculoskeletal:        General: Normal range of motion.  Skin:    General: Skin is warm and dry.     Capillary Refill: Capillary refill takes less than 2 seconds.  Neurological:     General: No focal deficit present.     Mental Status: She is alert and oriented to person, place, and time.     Cranial Nerves: No cranial nerve deficit.     Motor: No weakness.  Psychiatric:        Mood and Affect: Mood normal.        Behavior: Behavior normal.        Thought Content: Thought content normal.        Judgment: Judgment normal.         Assessment And Plan:     1. Essential hypertension Comments: Blood pressure is fairly controlled, continue focusing on lifestyle changes - CMP14+EGFR  2. Obesity, diabetes and hypertension syndrome  Comments: no current medications. - CMP14+EGFR - Hemoglobin A1c - Lipid panel  3. Other iron deficiency  anemia Comments: Will repeat iron studies, no longer taking iron supplement - CBC - Iron, TIBC and Ferritin Panel  4. Class 1 obesity due  to excess calories without serious comorbidity with body mass index (BMI) of 34.0 to 34.9 in adult She is encouraged to strive for BMI less than 30 to decrease cardiac risk. Advised to aim for at least 150 minutes of exercise per week.  5. Viral sinusitis Comments: Treated at ED, she is to take norel ad as needed and antihistamine. Improving. TCM Performed. A member of the clinical team spoke with the patient upon dischare. Discharge summary was reviewed in full detail during the visit. Meds reconciled and compared to discharge meds. Medication list is updated and reviewed with the patient.  Greater than 50% face to face time was spent in counseling an coordination of care.  All questions were answered to the satisfaction of the patient.   - cetirizine (ZYRTEC) 10 MG tablet; Take 1 tablet (10 mg total) by mouth daily.  Dispense: 30 tablet; Refill: 3 - NOREL AD 4-10-325 MG TABS; Take 1 tablet by mouth 2 (two) times daily as needed.  Dispense: 84 tablet; Refill: 0  6. Follow-up exam She is encouraged to strive for BMI less than 30 to decrease cardiac risk. Advised to aim for at least 150 minutes of exercise per week.    Patient was given opportunity to ask questions. Patient verbalized understanding of the plan and was able to repeat key elements of the plan. All questions were answered to their satisfaction.  Amy Brine, FNP   I, Amy Brine, FNP, have reviewed all documentation for this visit. The documentation on 01/08/23 for the exam, diagnosis, procedures, and orders are all accurate and complete.   IF YOU HAVE BEEN REFERRED TO A SPECIALIST, IT MAY TAKE 1-2 WEEKS TO SCHEDULE/PROCESS THE REFERRAL. IF YOU HAVE NOT HEARD FROM US/SPECIALIST IN TWO WEEKS, PLEASE GIVE [place] A CALL AT [phone].   THE PATIENT IS ENCOURAGED TO PRACTICE SOCIAL DISTANCING  DUE TO THE COVID-19 PANDEMIC.

## 2023-01-09 LAB — HEMOGLOBIN A1C
Est. average glucose Bld gHb Est-mCnc: 114 mg/dL
Hgb A1c MFr Bld: 5.6 % (ref 4.8–5.6)

## 2023-01-09 LAB — CBC
Hematocrit: 32.7 % — ABNORMAL LOW (ref 34.0–46.6)
Hemoglobin: 11 g/dL — ABNORMAL LOW (ref 11.1–15.9)
MCH: 30.1 pg (ref 26.6–33.0)
MCHC: 33.6 g/dL (ref 31.5–35.7)
MCV: 89 fL (ref 79–97)
Platelets: 438 10*3/uL (ref 150–450)
RBC: 3.66 x10E6/uL — ABNORMAL LOW (ref 3.77–5.28)
RDW: 11.8 % (ref 11.7–15.4)
WBC: 6.2 10*3/uL (ref 3.4–10.8)

## 2023-01-09 LAB — CMP14+EGFR
ALT: 17 IU/L (ref 0–32)
AST: 25 IU/L (ref 0–40)
Albumin/Globulin Ratio: 1.1 — ABNORMAL LOW (ref 1.2–2.2)
Albumin: 4.2 g/dL (ref 3.8–4.9)
Alkaline Phosphatase: 140 IU/L — ABNORMAL HIGH (ref 44–121)
BUN/Creatinine Ratio: 22 (ref 9–23)
BUN: 20 mg/dL (ref 6–24)
Bilirubin Total: 0.3 mg/dL (ref 0.0–1.2)
CO2: 25 mmol/L (ref 20–29)
Calcium: 9.2 mg/dL (ref 8.7–10.2)
Chloride: 96 mmol/L (ref 96–106)
Creatinine, Ser: 0.93 mg/dL (ref 0.57–1.00)
Globulin, Total: 3.8 g/dL (ref 1.5–4.5)
Glucose: 76 mg/dL (ref 70–99)
Potassium: 4.6 mmol/L (ref 3.5–5.2)
Sodium: 137 mmol/L (ref 134–144)
Total Protein: 8 g/dL (ref 6.0–8.5)
eGFR: 71 mL/min/{1.73_m2} (ref >59)

## 2023-01-09 LAB — IRON,TIBC AND FERRITIN PANEL
Ferritin: 168 ng/mL — ABNORMAL HIGH (ref 15–150)
Iron Saturation: 11 % — ABNORMAL LOW (ref 15–55)
Iron: 33 ug/dL (ref 27–159)
Total Iron Binding Capacity: 313 ug/dL (ref 250–450)
UIBC: 280 ug/dL (ref 131–425)

## 2023-01-09 LAB — LIPID PANEL
Chol/HDL Ratio: 2.4 ratio (ref 0.0–4.4)
Cholesterol, Total: 133 mg/dL (ref 100–199)
HDL: 56 mg/dL (ref >39)
LDL Chol Calc (NIH): 64 mg/dL (ref 0–99)
Triglycerides: 62 mg/dL (ref 0–149)
VLDL Cholesterol Cal: 13 mg/dL (ref 5–40)

## 2023-02-03 ENCOUNTER — Other Ambulatory Visit: Payer: Self-pay | Admitting: Nurse Practitioner

## 2023-04-03 DIAGNOSIS — H04129 Dry eye syndrome of unspecified lacrimal gland: Secondary | ICD-10-CM | POA: Diagnosis not present

## 2023-04-03 DIAGNOSIS — J302 Other seasonal allergic rhinitis: Secondary | ICD-10-CM | POA: Diagnosis not present

## 2023-04-03 DIAGNOSIS — Z8249 Family history of ischemic heart disease and other diseases of the circulatory system: Secondary | ICD-10-CM | POA: Diagnosis not present

## 2023-04-03 DIAGNOSIS — D631 Anemia in chronic kidney disease: Secondary | ICD-10-CM | POA: Diagnosis not present

## 2023-04-03 DIAGNOSIS — E1122 Type 2 diabetes mellitus with diabetic chronic kidney disease: Secondary | ICD-10-CM | POA: Diagnosis not present

## 2023-04-03 DIAGNOSIS — R011 Cardiac murmur, unspecified: Secondary | ICD-10-CM | POA: Diagnosis not present

## 2023-04-03 DIAGNOSIS — Z833 Family history of diabetes mellitus: Secondary | ICD-10-CM | POA: Diagnosis not present

## 2023-04-03 DIAGNOSIS — Z6836 Body mass index (BMI) 36.0-36.9, adult: Secondary | ICD-10-CM | POA: Diagnosis not present

## 2023-04-03 DIAGNOSIS — N182 Chronic kidney disease, stage 2 (mild): Secondary | ICD-10-CM | POA: Diagnosis not present

## 2023-04-03 DIAGNOSIS — I129 Hypertensive chronic kidney disease with stage 1 through stage 4 chronic kidney disease, or unspecified chronic kidney disease: Secondary | ICD-10-CM | POA: Diagnosis not present

## 2023-04-03 DIAGNOSIS — Z882 Allergy status to sulfonamides status: Secondary | ICD-10-CM | POA: Diagnosis not present

## 2023-05-17 LAB — HM DIABETES EYE EXAM

## 2023-07-04 ENCOUNTER — Encounter: Payer: Self-pay | Admitting: Nurse Practitioner

## 2023-07-04 ENCOUNTER — Ambulatory Visit: Payer: 59 | Admitting: Nurse Practitioner

## 2023-07-04 VITALS — BP 134/70 | HR 64 | Temp 97.8°F | Ht 66.0 in | Wt 228.0 lb

## 2023-07-04 DIAGNOSIS — E669 Obesity, unspecified: Secondary | ICD-10-CM | POA: Diagnosis not present

## 2023-07-04 DIAGNOSIS — E1169 Type 2 diabetes mellitus with other specified complication: Secondary | ICD-10-CM | POA: Diagnosis not present

## 2023-07-04 DIAGNOSIS — R82998 Other abnormal findings in urine: Secondary | ICD-10-CM

## 2023-07-04 DIAGNOSIS — E1159 Type 2 diabetes mellitus with other circulatory complications: Secondary | ICD-10-CM

## 2023-07-04 DIAGNOSIS — Z23 Encounter for immunization: Secondary | ICD-10-CM

## 2023-07-04 DIAGNOSIS — N3000 Acute cystitis without hematuria: Secondary | ICD-10-CM

## 2023-07-04 DIAGNOSIS — I152 Hypertension secondary to endocrine disorders: Secondary | ICD-10-CM | POA: Diagnosis not present

## 2023-07-04 DIAGNOSIS — R350 Frequency of micturition: Secondary | ICD-10-CM

## 2023-07-04 DIAGNOSIS — M25512 Pain in left shoulder: Secondary | ICD-10-CM | POA: Diagnosis not present

## 2023-07-04 DIAGNOSIS — M25511 Pain in right shoulder: Secondary | ICD-10-CM | POA: Diagnosis not present

## 2023-07-04 DIAGNOSIS — E6609 Other obesity due to excess calories: Secondary | ICD-10-CM | POA: Diagnosis not present

## 2023-07-04 DIAGNOSIS — Z6836 Body mass index (BMI) 36.0-36.9, adult: Secondary | ICD-10-CM | POA: Diagnosis not present

## 2023-07-04 LAB — POCT URINALYSIS DIP (CLINITEK)
Bilirubin, UA: NEGATIVE
Glucose, UA: NEGATIVE mg/dL
Ketones, POC UA: NEGATIVE mg/dL
Leukocytes, UA: NEGATIVE
Nitrite, UA: POSITIVE — AB
POC PROTEIN,UA: NEGATIVE
Spec Grav, UA: 1.015 (ref 1.010–1.025)
Urobilinogen, UA: 0.2 E.U./dL
pH, UA: 6.5 (ref 5.0–8.0)

## 2023-07-04 MED ORDER — MELOXICAM 7.5 MG PO TABS
7.5000 mg | ORAL_TABLET | Freq: Every day | ORAL | 1 refills | Status: DC
Start: 2023-07-04 — End: 2023-08-08

## 2023-07-04 MED ORDER — MOUNJARO 2.5 MG/0.5ML ~~LOC~~ SOAJ
2.5000 mg | SUBCUTANEOUS | Status: DC
Start: 2023-07-04 — End: 2023-07-17

## 2023-07-04 MED ORDER — NITROFURANTOIN MONOHYD MACRO 100 MG PO CAPS
100.0000 mg | ORAL_CAPSULE | Freq: Two times a day (BID) | ORAL | 0 refills | Status: AC
Start: 2023-07-04 — End: 2023-07-09

## 2023-07-04 NOTE — Progress Notes (Signed)
Madelaine Bhat, CMA,acting as a Neurosurgeon for Arnette Felts, FNP.,have documented all relevant documentation on the behalf of Arnette Felts, FNP,as directed by  Arnette Felts, FNP while in the presence of Arnette Felts, FNP.  Subjective:  Patient ID: Amy Blair , female    DOB: March 29, 1966 , 57 y.o.   MRN: [id number]  Chief Complaint  Patient presents with   Hypertension    HPI  Patient presents today for a BP, and DM follow up. Patient reports compliance with medications, patient denies any chest pains, SOB, or headaches. Patient reports at home her bp has been running high after missing 3 days of medications but not back to back days. While at the weight loss clinic yesterday her bp was 190/102 but did not have any symptoms. She was on vacation for 2 weeks and she missed a dose and then again on Sunday (past). She feels like she ate fairly well with her diet. She had diabetes when she weighed 300 lbs prior to coming to this office.   Patient reports her right shoulder has been having pains, she reports it then moved to her left shoulder. She reports she has been lifting heavy waters and moving them.   BP Readings from Last 3 Encounters: 07/04/23 : 134/70 01/08/23 : 126/84 01/07/23 : Marland Kitchen 160/98       Past Medical History:  Diagnosis Date   Anemia    HX   Arthritis    knees-a little   ASCUS with positive high risk HPV cervical 03/2018   Colposcopy inadequate but negative.  ECC showed LGSIL.   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   Hypertension      Family History  Problem Relation Age of Onset   Arthritis Mother    Cancer Mother        Cervix   Diabetes Mother    Hyperlipidemia Mother    Hypertension Mother    Diabetes Father    Heart disease Father    Cancer Sister        cervical   Diabetes Sister    Hypertension Sister    Diabetes Sister    Hypertension Sister    Breast cancer Maternal Aunt    Anesthesia problems Neg Hx       Current Outpatient Medications:    cetirizine (ZYRTEC) 10 MG tablet, Take 1 tablet (10 mg total) by mouth daily., Disp: 30 tablet, Rfl: 3   Cholecalciferol (VITAMIN D) 50 MCG (2000 UT) CAPS, Take by mouth., Disp: , Rfl:    cyclobenzaprine (FLEXERIL) 10 MG tablet, Take 1 tablet (10 mg total) by mouth 3 (three) times daily as needed for muscle spasms., Disp: 20 tablet, Rfl: 0   ibuprofen (ADVIL) 800 MG tablet, Take 1 tablet (800 mg total) by mouth 3 (three) times daily. Do NOT take on an empty stomach.  Drink plenty of water with this medication, Disp: 21 tablet, Rfl: 0   Lifitegrast (XIIDRA) 5 % SOLN, Apply to eye., Disp: , Rfl:    linaclotide (LINZESS) 145 MCG CAPS capsule, Take 1 capsule (145 mcg total) by mouth daily., Disp: 90 capsule, Rfl: 1   lisinopril-hydrochlorothiazide (ZESTORETIC) 20-25 MG tablet, TAKE 1 TABLET BY MOUTH EVERY DAY, Disp: 90 tablet, Rfl: 1   Multiple Vitamins-Minerals (MULTIVITAMIN WITH MINERALS) tablet, Bariatric vitamin, Disp: 30 tablet, Rfl: 3   NOREL AD 4-10-325 MG TABS, Take 1 tablet by mouth 2 (two) times daily as needed.,  Disp: 84 tablet, Rfl: 0   promethazine (PHENERGAN) 25 MG tablet, Take 1 tablet (25 mg total) by mouth every 8 (eight) hours as needed for nausea or vomiting., Disp: 45 tablet, Rfl: 2   SUMAtriptan (IMITREX) 100 MG tablet, TAKE ONE TABLET BY MOUTH AT ONSET OF HEADACHE, THEN REPEAT IN 2 HOURS, Disp: 10 tablet, Rfl: 0   valACYclovir (VALTREX) 500 MG tablet, Take 1 tablet (500 mg total) by mouth daily., Disp: 30 tablet, Rfl: 1   meloxicam (MOBIC) 7.5 MG tablet, Take 1 tablet (7.5 mg total) by mouth daily., Disp: 30 tablet, Rfl: 1   tirzepatide (MOUNJARO) 2.5 MG/0.5ML Pen, Inject 2.5 mg into the skin once a week., Disp: 2 mL, Rfl: 0   Allergies  Allergen Reactions   Sulfa Antibiotics Hives     Review of Systems  Constitutional: Negative.   HENT: Negative.    Eyes: Negative.   Respiratory: Negative.    Cardiovascular: Negative.    Gastrointestinal: Negative.   Neurological: Negative.   Psychiatric/Behavioral: Negative.       Today's Vitals   07/04/23 1228  BP: 134/70  Pulse: 64  Temp: 97.8 F (36.6 C)  TempSrc: Oral  Weight: 228 lb (103.4 kg)  Height: 5\' 6"  (1.676 m)  PainSc: 0-No pain   Body mass index is 36.8 kg/m.  Wt Readings from Last 3 Encounters:  07/04/23 228 lb (103.4 kg)  01/08/23 214 lb 9.6 oz (97.3 kg)  09/21/22 218 lb (98.9 kg)      Objective:  Physical Exam Vitals reviewed.  Constitutional:      General: She is not in acute distress.    Appearance: Normal appearance. She is well-developed. She is obese.  Eyes:     Pupils: Pupils are equal, round, and reactive to light.  Cardiovascular:     Rate and Rhythm: Normal rate and regular rhythm.     Pulses: Normal pulses.     Heart sounds: Normal heart sounds. No murmur heard. Pulmonary:     Effort: Pulmonary effort is normal. No respiratory distress.     Breath sounds: Normal breath sounds. No wheezing.  Musculoskeletal:        General: Normal range of motion.  Skin:    General: Skin is warm and dry.     Capillary Refill: Capillary refill takes less than 2 seconds.  Neurological:     General: No focal deficit present.     Mental Status: She is alert and oriented to person, place, and time.     Cranial Nerves: No cranial nerve deficit.     Motor: No weakness.  Psychiatric:        Mood and Affect: Mood normal.        Behavior: Behavior normal.        Thought Content: Thought content normal.        Judgment: Judgment normal.         Assessment And Plan:  Obesity, diabetes, and hypertension syndrome (HCC) Assessment & Plan: B/P is fairly controlled.  CMP ordered to check renal function.  The importance of regular exercise and dietary modification was stressed to the patient.  Stressed importance of losing ten percent of her body weight to help with B/P control.  The weight loss would help with decreasing cardiac and cancer  risk as well.  She had been on Ozempic in the past. Will try to start her on Mounjaro Encouraged to limit intake of sugary foods and drinks Encouraged to increase physical activity to 150  minutes per week   Suggestion is made for her to avoid simple carbohydrates from her diet including Cakes, Sweet Desserts, Ice Cream, Soda (diet and regular), Sweet Tea, Candies, Chips, Cookies, Store Bought Juices, Alcohol in Excess of 1-2 drinks a day, Artificial Sweeteners, Coffee Creamer, and "Sugar-free" Products. This will help patient to have more stable blood glucose    Orders: -     Hemoglobin A1c -     Microalbumin / creatinine urine ratio -     CMP14+EGFR -     Mounjaro; Inject 2.5 mg into the skin once a week.  Dispense: 2 mL; Refill: 0  Class 2 obesity due to excess calories with body mass index (BMI) of 36.0 to 36.9 in adult, unspecified whether serious comorbidity present  Acute pain of both shoulders Assessment & Plan: ROM is decreased, will treat with meloxicam  Orders: -     Meloxicam; Take 1 tablet (7.5 mg total) by mouth daily.  Dispense: 30 tablet; Refill: 1  Acute cystitis without hematuria Assessment & Plan: Positive nitrates in urine will send urine for culture and treat with nitrofuratoin. Avoid sodas and drink more water  Orders: -     POCT URINALYSIS DIP (CLINITEK) -     Urine Culture -     Nitrofurantoin Monohyd Macro; Take 1 capsule (100 mg total) by mouth 2 (two) times daily for 5 days.  Dispense: 10 capsule; Refill: 0  COVID-19 vaccine administered Assessment & Plan: Covid 19 vaccine given in office observed for 15 minutes without any adverse reaction   Orders: [employer] Fall 2023 Covid-19 Vaccine 69yrs and older    Return for uncontrolled bp 3-68months check.  Patient was given opportunity to ask questions. Patient verbalized understanding of the plan and was able to repeat key elements of the plan. All questions were answered to their satisfaction.     Jeanell Sparrow, FNP, have reviewed all documentation for this visit. The documentation on 07/04/23 for the exam, diagnosis, procedures, and orders are all accurate and complete.   IF YOU HAVE BEEN REFERRED TO A SPECIALIST, IT MAY TAKE 1-2 WEEKS TO SCHEDULE/PROCESS THE REFERRAL. IF YOU HAVE NOT HEARD FROM US/SPECIALIST IN TWO WEEKS, PLEASE GIVE [place] A CALL AT [phone].

## 2023-07-05 LAB — CMP14+EGFR
ALT: 18 IU/L (ref 0–32)
AST: 21 IU/L (ref 0–40)
Albumin: 4.3 g/dL (ref 3.8–4.9)
Alkaline Phosphatase: 134 IU/L — ABNORMAL HIGH (ref 44–121)
BUN/Creatinine Ratio: 24 — ABNORMAL HIGH (ref 9–23)
BUN: 21 mg/dL (ref 6–24)
Bilirubin Total: 0.3 mg/dL (ref 0.0–1.2)
CO2: 27 mmol/L (ref 20–29)
Calcium: 9.6 mg/dL (ref 8.7–10.2)
Chloride: 98 mmol/L (ref 96–106)
Creatinine, Ser: 0.87 mg/dL (ref 0.57–1.00)
Globulin, Total: 3.8 g/dL (ref 1.5–4.5)
Glucose: 71 mg/dL (ref 70–99)
Potassium: 4.4 mmol/L (ref 3.5–5.2)
Sodium: 137 mmol/L (ref 134–144)
Total Protein: 8.1 g/dL (ref 6.0–8.5)
eGFR: 77 mL/min/{1.73_m2} (ref >59)

## 2023-07-05 LAB — URINE CULTURE

## 2023-07-05 LAB — MICROALBUMIN / CREATININE URINE RATIO
Creatinine, Urine: 18 mg/dL
Microalb/Creat Ratio: 77 mg/g creat — ABNORMAL HIGH (ref 0–29)
Microalbumin, Urine: 13.8 ug/mL

## 2023-07-05 LAB — HEMOGLOBIN A1C
Est. average glucose Bld gHb Est-mCnc: 114 mg/dL
Hgb A1c MFr Bld: 5.6 % (ref 4.8–5.6)

## 2023-07-17 ENCOUNTER — Other Ambulatory Visit: Payer: Self-pay | Admitting: Nurse Practitioner

## 2023-07-17 DIAGNOSIS — I152 Hypertension secondary to endocrine disorders: Secondary | ICD-10-CM

## 2023-07-17 DIAGNOSIS — R350 Frequency of micturition: Secondary | ICD-10-CM | POA: Insufficient documentation

## 2023-07-17 DIAGNOSIS — Z23 Encounter for immunization: Secondary | ICD-10-CM | POA: Insufficient documentation

## 2023-07-17 DIAGNOSIS — E119 Type 2 diabetes mellitus without complications: Secondary | ICD-10-CM | POA: Insufficient documentation

## 2023-07-17 DIAGNOSIS — M25511 Pain in right shoulder: Secondary | ICD-10-CM | POA: Insufficient documentation

## 2023-07-17 DIAGNOSIS — E6609 Other obesity due to excess calories: Secondary | ICD-10-CM | POA: Insufficient documentation

## 2023-07-17 DIAGNOSIS — N3000 Acute cystitis without hematuria: Secondary | ICD-10-CM | POA: Insufficient documentation

## 2023-07-17 MED ORDER — MOUNJARO 2.5 MG/0.5ML ~~LOC~~ SOAJ
2.5000 mg | SUBCUTANEOUS | 0 refills | Status: DC
Start: 2023-07-17 — End: 2023-07-18

## 2023-07-17 NOTE — Assessment & Plan Note (Signed)
ROM is decreased, will treat with meloxicam

## 2023-07-17 NOTE — Assessment & Plan Note (Signed)
Positive nitrates in urine will send urine for culture and treat with nitrofuratoin. Avoid sodas and drink more water

## 2023-07-17 NOTE — Assessment & Plan Note (Signed)
Covid 19 vaccine given in office observed for 15 minutes without any adverse reaction  

## 2023-07-17 NOTE — Assessment & Plan Note (Signed)
B/P is fairly controlled.  CMP ordered to check renal function.  The importance of regular exercise and dietary modification was stressed to the patient.  Stressed importance of losing ten percent of her body weight to help with B/P control.  The weight loss would help with decreasing cardiac and cancer risk as well.  She had been on Ozempic in the past. Will try to start her on Mounjaro Encouraged to limit intake of sugary foods and drinks Encouraged to increase physical activity to 150 minutes per week   Suggestion is made for her to avoid simple carbohydrates from her diet including Cakes, Sweet Desserts, Ice Cream, Soda (diet and regular), Sweet Tea, Candies, Chips, Cookies, Store Bought Juices, Alcohol in Excess of 1-2 drinks a day, Artificial Sweeteners, Coffee Creamer, and "Sugar-free" Products. This will help patient to have more stable blood glucose

## 2023-07-18 ENCOUNTER — Ambulatory Visit (INDEPENDENT_AMBULATORY_CARE_PROVIDER_SITE_OTHER): Payer: 59 | Admitting: Nurse Practitioner

## 2023-07-18 ENCOUNTER — Other Ambulatory Visit: Payer: Self-pay | Admitting: Nurse Practitioner

## 2023-07-18 ENCOUNTER — Encounter: Payer: Self-pay | Admitting: Nurse Practitioner

## 2023-07-18 VITALS — BP 136/80 | HR 60 | Temp 97.9°F | Ht 66.0 in | Wt 224.8 lb

## 2023-07-18 DIAGNOSIS — D508 Other iron deficiency anemias: Secondary | ICD-10-CM

## 2023-07-18 DIAGNOSIS — E6609 Other obesity due to excess calories: Secondary | ICD-10-CM

## 2023-07-18 DIAGNOSIS — I10 Essential (primary) hypertension: Secondary | ICD-10-CM

## 2023-07-18 DIAGNOSIS — Z79899 Other long term (current) drug therapy: Secondary | ICD-10-CM

## 2023-07-18 DIAGNOSIS — E119 Type 2 diabetes mellitus without complications: Secondary | ICD-10-CM | POA: Diagnosis not present

## 2023-07-18 DIAGNOSIS — Z6836 Body mass index (BMI) 36.0-36.9, adult: Secondary | ICD-10-CM | POA: Diagnosis not present

## 2023-07-18 DIAGNOSIS — Z0000 Encounter for general adult medical examination without abnormal findings: Secondary | ICD-10-CM | POA: Diagnosis not present

## 2023-07-18 LAB — POCT URINALYSIS DIP (CLINITEK)
Bilirubin, UA: NEGATIVE
Blood, UA: NEGATIVE
Glucose, UA: NEGATIVE mg/dL
Ketones, POC UA: NEGATIVE mg/dL
Leukocytes, UA: NEGATIVE
Nitrite, UA: NEGATIVE
POC PROTEIN,UA: NEGATIVE
Spec Grav, UA: 1.025 (ref 1.010–1.025)
Urobilinogen, UA: 0.2 E.U./dL
pH, UA: 5.5 (ref 5.0–8.0)

## 2023-07-18 MED ORDER — MOUNJARO 5 MG/0.5ML ~~LOC~~ SOAJ: 5.0000 mg | SUBCUTANEOUS | 0 refills | Status: DC

## 2023-07-18 MED ORDER — LISINOPRIL-HYDROCHLOROTHIAZIDE 20-25 MG PO TABS
1.0000 | ORAL_TABLET | Freq: Every day | ORAL | 1 refills | Status: DC
Start: 2023-07-18 — End: 2023-11-07

## 2023-07-18 NOTE — Progress Notes (Signed)
Madelaine Bhat, CMA,acting as a Neurosurgeon for Arnette Felts, FNP.,have documented all relevant documentation on the behalf of Arnette Felts, FNP,as directed by  Arnette Felts, FNP while in the presence of Arnette Felts, FNP.  Subjective:    Patient ID: Amy Blair , female    DOB: July 29, 1966 , 57 y.o.   MRN: [id number]  Chief Complaint  Patient presents with   Annual Exam    HPI  Patient presents today for HM. Patient reports compliance with medication. Patient denies any chest pain, SOB, and headaches. Patient has no concerns today.   She is on her second week of Mounjaro, she has noticed she does not have an appetite.   Wt Readings from Last 3 Encounters: 07/18/23 : 224 lb 12.8 oz (102 kg) 07/04/23 : 228 lb (103.4 kg) 01/08/23 : 214 lb 9.6 oz (97.3 kg)       Past Medical History:  Diagnosis Date   Anemia    HX   Arthritis    knees-a little   ASCUS with positive high risk HPV cervical 03/2018   Colposcopy inadequate but negative.  ECC showed LGSIL.   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   Hypertension      Family History  Problem Relation Age of Onset   Arthritis Mother    Cancer Mother        Cervix   Diabetes Mother    Hyperlipidemia Mother    Hypertension Mother    Diabetes Father    Heart disease Father    Cancer Sister        cervical   Diabetes Sister    Hypertension Sister    Diabetes Sister    Hypertension Sister    Breast cancer Maternal Aunt    Anesthesia problems Neg Hx      Current Outpatient Medications:    cetirizine (ZYRTEC) 10 MG tablet, Take 1 tablet (10 mg total) by mouth daily., Disp: 30 tablet, Rfl: 3   Cholecalciferol (VITAMIN D) 50 MCG (2000 UT) CAPS, Take by mouth., Disp: , Rfl:    cyclobenzaprine (FLEXERIL) 10 MG tablet, Take 1 tablet (10 mg total) by mouth 3 (three) times daily as needed for muscle spasms., Disp: 20 tablet, Rfl: 0   ibuprofen (ADVIL) 800 MG tablet, Take 1 tablet (800 mg total)  by mouth 3 (three) times daily. Do NOT take on an empty stomach.  Drink plenty of water with this medication, Disp: 21 tablet, Rfl: 0   Lifitegrast (XIIDRA) 5 % SOLN, Apply to eye., Disp: , Rfl:    linaclotide (LINZESS) 145 MCG CAPS capsule, Take 1 capsule (145 mcg total) by mouth daily., Disp: 90 capsule, Rfl: 1   meloxicam (MOBIC) 7.5 MG tablet, Take 1 tablet (7.5 mg total) by mouth daily., Disp: 30 tablet, Rfl: 1   Multiple Vitamins-Minerals (MULTIVITAMIN WITH MINERALS) tablet, Bariatric vitamin, Disp: 30 tablet, Rfl: 3   NOREL AD 4-10-325 MG TABS, Take 1 tablet by mouth 2 (two) times daily as needed., Disp: 84 tablet, Rfl: 0   promethazine (PHENERGAN) 25 MG tablet, Take 1 tablet (25 mg total) by mouth every 8 (eight) hours as needed for nausea or vomiting., Disp: 45 tablet, Rfl: 2   SUMAtriptan (IMITREX) 100 MG tablet, TAKE ONE TABLET BY MOUTH AT ONSET OF HEADACHE, THEN REPEAT IN 2 HOURS, Disp: 10 tablet, Rfl: 0   tirzepatide (MOUNJARO) 5 MG/0.5ML Pen, Inject 5 mg into the skin once a  week., Disp: 2 mL, Rfl: 0   valACYclovir (VALTREX) 500 MG tablet, Take 1 tablet (500 mg total) by mouth daily., Disp: 30 tablet, Rfl: 1   lisinopril-hydrochlorothiazide (ZESTORETIC) 20-25 MG tablet, Take 1 tablet by mouth daily., Disp: 90 tablet, Rfl: 1   Allergies  Allergen Reactions   Sulfa Antibiotics Hives      The patient states she uses post menopausal status for birth control. Patient's last menstrual period was 02/20/2012.. Negative for Dysmenorrhea and Negative for Menorrhagia. Negative for: breast discharge, breast lump(s), breast pain and breast self exam. Associated symptoms include abnormal vaginal bleeding. Pertinent negatives include abnormal bleeding (hematology), anxiety, decreased libido, depression, difficulty falling sleep, dyspareunia, history of infertility, nocturia, sexual dysfunction, sleep disturbances, urinary incontinence, urinary urgency, vaginal discharge and vaginal itching. Diet  regular; she is eating grilled foods, no white carbs, drinking water and tea.  The patient states her exercise level is minimal - she is working 7 days a week with physical activity.   The patient's tobacco use is:  Social History   Tobacco Use  Smoking Status Never  Smokeless Tobacco Never   She has been exposed to passive smoke. The patient's alcohol use is:  Social History   Substance and Sexual Activity  Alcohol Use Not Currently  . Additional information: Last pap 09/21/2020, next one scheduled for 10/52024.    Review of Systems  Constitutional: Negative.   HENT: Negative.    Eyes: Negative.   Respiratory: Negative.    Cardiovascular: Negative.   Gastrointestinal: Negative.   Endocrine: Negative.   Genitourinary: Negative.   Musculoskeletal: Negative.   Skin: Negative.   Allergic/Immunologic: Negative.   Neurological: Negative.   Hematological: Negative.   Psychiatric/Behavioral: Negative.       Today's Vitals   07/18/23 1454  BP: 136/80  Pulse: 60  Temp: 97.9 F (36.6 C)  Weight: 224 lb 12.8 oz (102 kg)  Height: 5\' 6"  (1.676 m)  PainSc: 0-No pain   Body mass index is 36.28 kg/m.  Wt Readings from Last 3 Encounters:  07/18/23 224 lb 12.8 oz (102 kg)  07/04/23 228 lb (103.4 kg)  01/08/23 214 lb 9.6 oz (97.3 kg)     Objective:  Physical Exam Constitutional:      General: She is not in acute distress.    Appearance: Normal appearance. She is well-developed. She is obese.  HENT:     Head: Normocephalic and atraumatic.     Right Ear: Hearing, tympanic membrane, ear canal and external ear normal. There is no impacted cerumen.     Left Ear: Hearing, tympanic membrane, ear canal and external ear normal. There is no impacted cerumen.     Nose: Nose normal.     Mouth/Throat:     Mouth: Mucous membranes are moist.  Eyes:     General: Lids are normal.     Extraocular Movements: Extraocular movements intact.     Conjunctiva/sclera: Conjunctivae normal.      Pupils: Pupils are equal, round, and reactive to light.     Funduscopic exam:    Right eye: No papilledema.        Left eye: No papilledema.  Neck:     Thyroid: No thyroid mass.     Vascular: No carotid bruit.  Cardiovascular:     Rate and Rhythm: Normal rate and regular rhythm.     Pulses: Normal pulses.     Heart sounds: Normal heart sounds. No murmur heard. Pulmonary:     Effort: Pulmonary effort  is normal. No respiratory distress.     Breath sounds: Normal breath sounds. No wheezing.  Chest:     Chest wall: No mass.  Breasts:    Tanner Score is 5.     Right: Normal. No mass or tenderness.     Left: Normal. No mass or tenderness.  Abdominal:     General: Abdomen is flat. Bowel sounds are normal. There is no distension.     Palpations: Abdomen is soft.     Tenderness: There is no abdominal tenderness.  Musculoskeletal:        General: No swelling. Normal range of motion.     Cervical back: Full passive range of motion without pain, normal range of motion and neck supple.     Right lower leg: No edema.     Left lower leg: No edema.  Lymphadenopathy:     Upper Body:     Right upper body: No supraclavicular, axillary or pectoral adenopathy.     Left upper body: No supraclavicular, axillary or pectoral adenopathy.  Skin:    General: Skin is warm and dry.     Capillary Refill: Capillary refill takes less than 2 seconds.  Neurological:     General: No focal deficit present.     Mental Status: She is alert and oriented to person, place, and time.     Cranial Nerves: No cranial nerve deficit.     Sensory: No sensory deficit.     Motor: No weakness.  Psychiatric:        Mood and Affect: Mood normal.        Behavior: Behavior normal.        Thought Content: Thought content normal.        Judgment: Judgment normal.         Assessment And Plan:     Encounter for annual health examination Assessment & Plan: Behavior modifications discussed and diet history reviewed.   Pt  will continue to exercise regularly and modify diet with low GI, plant based foods and decrease intake of processed foods.  Recommend intake of daily multivitamin, Vitamin D, and calcium.  Recommend mammogram and colonoscopy for preventive screenings, as well as recommend immunizations that include influenza, TDAP, and Shingles    Controlled type 2 diabetes mellitus without complication, without long-term current use of insulin (HCC) Assessment & Plan: She is doing well with the [facility].  Will not repeat her hemoglobin A1c at this time.   Essential hypertension Assessment & Plan: Blood pressure is fairly controlled, advised to focus on lifestyle modifications.  Continue current medications.  EKG done with sinus bradycardia heart rate 50.  Orders: -     POCT URINALYSIS DIP (CLINITEK) -     Microalbumin / creatinine urine ratio -     EKG 12-Lead -     Lisinopril-hydroCHLOROthiazide; Take 1 tablet by mouth daily.  Dispense: 90 tablet; Refill: 1  Iron deficiency anemia secondary to inadequate dietary iron intake Assessment & Plan: Will check iron levels.  Orders: -     Iron, TIBC and Ferritin Panel -     CBC  Class 2 obesity due to excess calories with body mass index (BMI) of 36.0 to 36.9 in adult, unspecified whether serious comorbidity present Assessment & Plan: She is encouraged to strive for BMI less than 30 to decrease cardiac risk. Advised to aim for at least 150 minutes of exercise per week.  Encouraged to park further when at the store, take stairs instead of elevators  and to walk in place during commercials.  Increase water intake to at least one gallon of water daily.     Other long term (current) drug therapy  Other orders -     Mounjaro; Inject 5 mg into the skin once a week.  Dispense: 2 mL; Refill: 0     Return for 1 year physical, 6 month bp check. Patient was given opportunity to ask questions. Patient verbalized understanding of the plan and was able to  repeat key elements of the plan. All questions were answered to their satisfaction.   Arnette Felts, FNP  I, Arnette Felts, FNP, have reviewed all documentation for this visit. The documentation on 07/18/23 for the exam, diagnosis, procedures, and orders are all accurate and complete.

## 2023-07-25 ENCOUNTER — Other Ambulatory Visit: Payer: Self-pay | Admitting: Nurse Practitioner

## 2023-07-25 DIAGNOSIS — E119 Type 2 diabetes mellitus without complications: Secondary | ICD-10-CM | POA: Insufficient documentation

## 2023-07-25 DIAGNOSIS — Z0000 Encounter for general adult medical examination without abnormal findings: Secondary | ICD-10-CM | POA: Insufficient documentation

## 2023-07-25 NOTE — Assessment & Plan Note (Signed)
She is doing well with the [facility].  Will not repeat her hemoglobin A1c at this time.

## 2023-07-25 NOTE — Assessment & Plan Note (Addendum)
Blood pressure is fairly controlled, advised to focus on lifestyle modifications.  Continue current medications.  EKG done with sinus bradycardia heart rate 50.

## 2023-07-25 NOTE — Assessment & Plan Note (Signed)
Behavior modifications discussed and diet history reviewed.   Pt will continue to exercise regularly and modify diet with low GI, plant based foods and decrease intake of processed foods.  Recommend intake of daily multivitamin, Vitamin D, and calcium.  Recommend mammogram and colonoscopy for preventive screenings, as well as recommend immunizations that include influenza, TDAP, and Shingles  

## 2023-07-25 NOTE — Assessment & Plan Note (Signed)
Will check iron levels  

## 2023-07-25 NOTE — Assessment & Plan Note (Signed)
She is encouraged to strive for BMI less than 30 to decrease cardiac risk. Advised to aim for at least 150 minutes of exercise per week.  Encouraged to park further when at the store, take stairs instead of elevators and to walk in place during commercials. Increase water intake to at least one gallon of water daily.  

## 2023-08-07 ENCOUNTER — Other Ambulatory Visit: Payer: Self-pay | Admitting: Nurse Practitioner

## 2023-08-07 DIAGNOSIS — M25511 Pain in right shoulder: Secondary | ICD-10-CM

## 2023-09-28 DIAGNOSIS — Z1231 Encounter for screening mammogram for malignant neoplasm of breast: Secondary | ICD-10-CM | POA: Diagnosis not present

## 2023-09-28 LAB — HM MAMMOGRAPHY

## 2023-10-02 ENCOUNTER — Encounter: Payer: Self-pay | Admitting: Nurse Practitioner

## 2023-10-23 DIAGNOSIS — H00014 Hordeolum externum left upper eyelid: Secondary | ICD-10-CM | POA: Diagnosis not present

## 2023-11-04 DIAGNOSIS — L03011 Cellulitis of right finger: Secondary | ICD-10-CM | POA: Diagnosis not present

## 2023-11-05 NOTE — Progress Notes (Addendum)
Amy Blair, CMA,acting as a Neurosurgeon for Amy Felts, FNP.,have documented all relevant documentation on the behalf of Amy Felts, FNP,as directed by  Amy Felts, FNP while in the presence of Amy Felts, FNP.  Subjective:  Patient ID: Amy Blair , female    DOB: 10/20/1966 , 57 y.o.   MRN: [id number]  Chief Complaint  Patient presents with   Hypertension    HPI  Patient presents today for a bp and dm follow up, Patient reports compliance with medication. Patient denies any chest pain, SOB, or headaches. Patient reports she had a ingrown nail on her right hand, middle finger. She reports she was treated at urgent care.   She had HgbA1c in 2010 she thinks was at least 10 when she was with the [facility]     Past Medical History:  Diagnosis Date   Anemia    HX   Arthritis    knees-a little   ASCUS with positive high risk HPV cervical 03/2018   Colposcopy inadequate but negative.  ECC showed LGSIL.   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   Hypertension      Family History  Problem Relation Age of Onset   Arthritis Mother    Cancer Mother        Cervix   Diabetes Mother    Hyperlipidemia Mother    Hypertension Mother    Diabetes Father    Heart disease Father    Cancer Sister        cervical   Diabetes Sister    Hypertension Sister    Diabetes Sister    Hypertension Sister    Breast cancer Maternal Aunt    Anesthesia problems Neg Hx      Current Outpatient Medications:    cetirizine (ZYRTEC) 10 MG tablet, Take 1 tablet (10 mg total) by mouth daily., Disp: 30 tablet, Rfl: 3   Cholecalciferol (VITAMIN D) 50 MCG (2000 UT) CAPS, Take by mouth., Disp: , Rfl:    cyclobenzaprine (FLEXERIL) 10 MG tablet, Take 1 tablet (10 mg total) by mouth 3 (three) times daily as needed for muscle spasms., Disp: 20 tablet, Rfl: 0   ibuprofen (ADVIL) 800 MG tablet, Take 1 tablet (800 mg  total) by mouth 3 (three) times daily. Do NOT take on an empty stomach.  Drink plenty of water with this medication, Disp: 21 tablet, Rfl: 0   Lifitegrast (XIIDRA) 5 % SOLN, Apply to eye., Disp: , Rfl:    linaclotide (LINZESS) 145 MCG CAPS capsule, Take 1 capsule (145 mcg total) by mouth daily., Disp: 90 capsule, Rfl: 1   Multiple Vitamins-Minerals (MULTIVITAMIN WITH MINERALS) tablet, Bariatric vitamin, Disp: 30 tablet, Rfl: 3   NOREL AD 4-10-325 MG TABS, Take 1 tablet by mouth 2 (two) times daily as needed., Disp: 84 tablet, Rfl: 0   promethazine (PHENERGAN) 25 MG tablet, Take 1 tablet (25 mg total) by mouth every 8 (eight) hours as needed for nausea or vomiting., Disp: 45 tablet, Rfl: 2   SUMAtriptan (IMITREX) 100 MG tablet, TAKE ONE TABLET BY MOUTH AT ONSET OF HEADACHE, THEN REPEAT IN 2 HOURS, Disp: 10 tablet, Rfl: 0   tirzepatide (MOUNJARO) 5 MG/0.5ML Pen, Inject 5 mg into the skin once a week., Disp: 2 mL, Rfl: 0   valACYclovir (VALTREX) 500 MG tablet, Take 1 tablet (500 mg total) by mouth daily., Disp: 30 tablet,  Rfl: 1   lisinopril-hydrochlorothiazide (ZESTORETIC) 20-25 MG tablet, Take 1 tablet by mouth daily., Disp: 90 tablet, Rfl: 1   meloxicam (MOBIC) 7.5 MG tablet, TAKE 1 TABLET BY MOUTH EVERY DAY, Disp: 30 tablet, Rfl: 1   Allergies  Allergen Reactions   Sulfa Antibiotics Hives     Review of Systems  Constitutional: Negative.   HENT: Negative.    Eyes: Negative.   Respiratory: Negative.    Cardiovascular: Negative.   Gastrointestinal:  Negative for constipation.  Endocrine: Negative.   Genitourinary: Negative.   Musculoskeletal: Negative.   Skin: Negative.   Allergic/Immunologic: Negative.   Neurological: Negative.   Hematological: Negative.   Psychiatric/Behavioral: Negative.       Today's Vitals   11/07/23 0918  BP: 130/76  Pulse: 89  Temp: 97.7 F (36.5 C)  TempSrc: Oral  Weight: 192 lb 3.2 oz (87.2 kg)  Height: 5\' 6"  (1.676 m)  PainSc: 0-No pain   Body  mass index is 31.02 kg/m.  Wt Readings from Last 3 Encounters:  11/07/23 192 lb 3.2 oz (87.2 kg)  07/18/23 224 lb 12.8 oz (102 kg)  07/04/23 228 lb (103.4 kg)    Objective:  Physical Exam Vitals reviewed.  Constitutional:      General: She is not in acute distress.    Appearance: Normal appearance. She is well-developed. She is obese.  Eyes:     Pupils: Pupils are equal, round, and reactive to light.  Cardiovascular:     Rate and Rhythm: Normal rate and regular rhythm.     Pulses: Normal pulses.     Heart sounds: Normal heart sounds. No murmur heard. Pulmonary:     Effort: Pulmonary effort is normal. No respiratory distress.     Breath sounds: Normal breath sounds. No wheezing.  Musculoskeletal:        General: Normal range of motion.  Skin:    General: Skin is warm and dry.     Capillary Refill: Capillary refill takes less than 2 seconds.  Neurological:     General: No focal deficit present.     Mental Status: She is alert and oriented to person, place, and time.     Cranial Nerves: No cranial nerve deficit.     Motor: No weakness.  Psychiatric:        Mood and Affect: Mood normal.        Behavior: Behavior normal.        Thought Content: Thought content normal.        Judgment: Judgment normal.      Title   Diabetic Foot Exam - detailed Date & Time: 11/07/2023  9:52 AM Diabetic Foot exam was performed with the following findings: Yes  Visual Foot Exam completed.: Yes  Is there a history of foot ulcer?: No Is there a foot ulcer now?: No Is there swelling?: No Is there elevated skin temperature?: No Is there abnormal foot shape?: No Is there a claw toe deformity?: No Are the toenails long?: No Are the toenails thick?: Yes Are the toenails ingrown?: No Is the skin thin, fragile, shiny and hairless?": No Normal Range of Motion?: Yes Is there foot or ankle muscle weakness?: No Do you have pain in calf while walking?: No Are the shoes appropriate in style and  fit?: Yes Can the patient see the bottom of their feet?: Yes Pulse Foot Exam completed.: Yes   Right Posterior Tibialis: Present Left posterior Tibialis: Present   Right Dorsalis Pedis: Present Left Dorsalis Pedis: Present  Sensory Foot Exam Completed.: Yes Semmes-Weinstein Monofilament Test "+" means "has sensation" and "-" means "no sensation"   R Site 1-Great Toe: Pos L Site 1-Great Toe: Pos   R Site 4: Pos L Site 4: Pos   R Site 6: Pos L Site 6: Pos     Image components are not supported.   Image components are not supported. Image components are not supported.  [place] Comments        Assessment And Plan:  Controlled type 2 diabetes mellitus with other circulatory complication, without long-term current use of insulin (HCC) Assessment & Plan: Controlled, continue Mounjaro.   Orders: -     Basic metabolic panel -     Hemoglobin A1c -     Lipid panel  Essential hypertension Assessment & Plan: Blood pressure is well controlled, continue focusing on lifestyle modifications.    Orders: -     Basic metabolic panel -     Lisinopril-hydroCHLOROthiazide; Take 1 tablet by mouth daily.  Dispense: 90 tablet; Refill: 1  Need for influenza vaccination Assessment & Plan: Influenza vaccine administered Encouraged to take Tylenol as needed for fever or muscle aches.   Orders: -     Flu vaccine trivalent PF, 6mos and older(Flulaval,Afluria,Fluarix,Fluzone)  COVID-19 vaccine administered Assessment & Plan: Covid 19 vaccine given in office observed for 15 minutes without any adverse reaction   Orders: -     Pfizer Comirnaty Covid-19 Vaccine 51yrs & older  Class 1 obesity due to excess calories with body mass index (BMI) of 31.0 to 31.9 in adult, unspecified whether serious comorbidity present Assessment & Plan: She is encouraged to strive for BMI less than 30 to decrease cardiac risk. Advised to aim for at least 150 minutes of exercise per week. She has had a  significant amount of weight loss approximately 20 lbs since last visit.         Return for change January appt to March.  Patient was given opportunity to ask questions. Patient verbalized understanding of the plan and was able to repeat key elements of the plan. All questions were answered to their satisfaction.    Jeanell Sparrow, FNP, have reviewed all documentation for this visit. The documentation on 11/16/23 for the exam, diagnosis, procedures, and orders are all accurate and complete.   IF YOU HAVE BEEN REFERRED TO A SPECIALIST, IT MAY TAKE 1-2 WEEKS TO SCHEDULE/PROCESS THE REFERRAL. IF YOU HAVE NOT HEARD FROM US/SPECIALIST IN TWO WEEKS, PLEASE GIVE [place] A CALL AT [phone] X [phone].

## 2023-11-07 ENCOUNTER — Encounter: Payer: Self-pay | Admitting: Nurse Practitioner

## 2023-11-07 ENCOUNTER — Ambulatory Visit: Payer: 59 | Admitting: Nurse Practitioner

## 2023-11-07 VITALS — BP 130/76 | HR 89 | Temp 97.7°F | Ht 66.0 in | Wt 192.2 lb

## 2023-11-07 DIAGNOSIS — I10 Essential (primary) hypertension: Secondary | ICD-10-CM

## 2023-11-07 DIAGNOSIS — E119 Type 2 diabetes mellitus without complications: Secondary | ICD-10-CM

## 2023-11-07 DIAGNOSIS — Z23 Encounter for immunization: Secondary | ICD-10-CM | POA: Insufficient documentation

## 2023-11-07 DIAGNOSIS — E1159 Type 2 diabetes mellitus with other circulatory complications: Secondary | ICD-10-CM | POA: Diagnosis not present

## 2023-11-07 DIAGNOSIS — Z6831 Body mass index (BMI) 31.0-31.9, adult: Secondary | ICD-10-CM

## 2023-11-07 DIAGNOSIS — E66811 Obesity, class 1: Secondary | ICD-10-CM

## 2023-11-07 DIAGNOSIS — E6609 Other obesity due to excess calories: Secondary | ICD-10-CM

## 2023-11-07 MED ORDER — LISINOPRIL-HYDROCHLOROTHIAZIDE 20-25 MG PO TABS
1.0000 | ORAL_TABLET | Freq: Every day | ORAL | 1 refills | Status: DC
Start: 2023-11-07 — End: 2024-03-26

## 2023-11-07 NOTE — Assessment & Plan Note (Signed)
She is encouraged to strive for BMI less than 30 to decrease cardiac risk. Advised to aim for at least 150 minutes of exercise per week. She has had a significant amount of weight loss approximately 20 lbs since last visit.

## 2023-11-07 NOTE — Assessment & Plan Note (Signed)
Influenza vaccine administered Encouraged to take Tylenol as needed for fever or muscle aches.

## 2023-11-07 NOTE — Assessment & Plan Note (Signed)
Controlled, continue Mounjaro.

## 2023-11-07 NOTE — Assessment & Plan Note (Signed)
Covid 19 vaccine given in office observed for 15 minutes without any adverse reaction  

## 2023-11-07 NOTE — Assessment & Plan Note (Signed)
Blood pressure is well controlled, continue focusing on lifestyle modifications.

## 2023-11-08 LAB — BASIC METABOLIC PANEL
BUN/Creatinine Ratio: 23 (ref 9–23)
BUN: 32 mg/dL — ABNORMAL HIGH (ref 6–24)
CO2: 23 mmol/L (ref 20–29)
Calcium: 9.3 mg/dL (ref 8.7–10.2)
Chloride: 103 mmol/L (ref 96–106)
Creatinine, Ser: 1.42 mg/dL — ABNORMAL HIGH (ref 0.57–1.00)
Glucose: 76 mg/dL (ref 70–99)
Potassium: 4.5 mmol/L (ref 3.5–5.2)
Sodium: 140 mmol/L (ref 134–144)
eGFR: 43 mL/min/{1.73_m2} — ABNORMAL LOW (ref >59)

## 2023-11-08 LAB — HEMOGLOBIN A1C
Est. average glucose Bld gHb Est-mCnc: 114 mg/dL
Hgb A1c MFr Bld: 5.6 % (ref 4.8–5.6)

## 2023-11-08 LAB — LIPID PANEL
Chol/HDL Ratio: 2.7 ratio (ref 0.0–4.4)
Cholesterol, Total: 122 mg/dL (ref 100–199)
HDL: 45 mg/dL (ref >39)
LDL Chol Calc (NIH): 62 mg/dL (ref 0–99)
Triglycerides: 73 mg/dL (ref 0–149)
VLDL Cholesterol Cal: 15 mg/dL (ref 5–40)

## 2023-11-10 ENCOUNTER — Other Ambulatory Visit: Payer: Self-pay | Admitting: Nurse Practitioner

## 2023-11-10 DIAGNOSIS — M25511 Pain in right shoulder: Secondary | ICD-10-CM

## 2024-01-17 ENCOUNTER — Ambulatory Visit: Payer: 59 | Admitting: Nurse Practitioner

## 2024-01-24 ENCOUNTER — Ambulatory Visit: Payer: BC Managed Care – PPO | Admitting: Nurse Practitioner

## 2024-01-24 ENCOUNTER — Encounter: Payer: Self-pay | Admitting: Nurse Practitioner

## 2024-01-24 VITALS — BP 110/60 | HR 75 | Temp 98.5°F | Ht 66.0 in | Wt 176.4 lb

## 2024-01-24 DIAGNOSIS — J302 Other seasonal allergic rhinitis: Secondary | ICD-10-CM | POA: Diagnosis not present

## 2024-01-24 DIAGNOSIS — E663 Overweight: Secondary | ICD-10-CM

## 2024-01-24 DIAGNOSIS — E1159 Type 2 diabetes mellitus with other circulatory complications: Secondary | ICD-10-CM

## 2024-01-24 DIAGNOSIS — R110 Nausea: Secondary | ICD-10-CM | POA: Insufficient documentation

## 2024-01-24 DIAGNOSIS — Z6828 Body mass index (BMI) 28.0-28.9, adult: Secondary | ICD-10-CM

## 2024-01-24 DIAGNOSIS — Z23 Encounter for immunization: Secondary | ICD-10-CM | POA: Diagnosis not present

## 2024-01-24 DIAGNOSIS — E119 Type 2 diabetes mellitus without complications: Secondary | ICD-10-CM | POA: Insufficient documentation

## 2024-01-24 DIAGNOSIS — Z2821 Immunization not carried out because of patient refusal: Secondary | ICD-10-CM

## 2024-01-24 MED ORDER — CETIRIZINE HCL 10 MG PO TABS: 10.0000 mg | ORAL_TABLET | Freq: Every day | ORAL | 3 refills | Status: DC

## 2024-01-24 MED ORDER — MOUNJARO 5 MG/0.5ML ~~LOC~~ SOAJ: 5.0000 mg | SUBCUTANEOUS | 0 refills | Status: DC

## 2024-01-24 NOTE — Assessment & Plan Note (Signed)
Prevnar 20 given today. 

## 2024-01-24 NOTE — Assessment & Plan Note (Signed)

## 2024-01-24 NOTE — Patient Instructions (Signed)
Goal to exercise 150 minutes per week with at least 2 days of strength training Encouraged to park further when at the store, take stairs instead of elevators and to walk in place during commercials. Increase water intake to at least one gallon of water daily.

## 2024-01-24 NOTE — Assessment & Plan Note (Signed)
 She had an elevated HgbA1c in 2009 of 7.2. will see if we can get her approved for Mounjaro .

## 2024-01-24 NOTE — Progress Notes (Signed)
 [name], CMA,acting as a neurosurgeon for Amy Ada, FNP.,have documented all relevant documentation on the behalf of Amy Ada, FNP,as directed by  Amy Ada, FNP while in the presence of Amy Ada, FNP.  Subjective:  Patient ID: Amy Blair , female    DOB: 1966/01/26 , 58 y.o.   MRN: [id number]  No chief complaint on file.   HPI  Patient presents today for a weight follow up, Patient reports compliance with medication. Patient denies any chest pain, SOB, or headaches. She does report having diabetes about 15 years ago with her A1c as high as 10 or more. She has had gastric bypass in 2010 at [facility]. Her heaviest weight was 391lbs. Patient reports she is nausea a lot she would like a a refill on her phenergan . She is walking 3 times a week for 30 minutes. She is not doing any strength training. She is drinking a protein a day and one meal, she is snacking on fruit throughout the day. She eats a lot salads. Her weight management provider did have a nutritionist. She is drinking a fairlife protein shake - 30 grams. She also eats cheese sticks with protein.   According to epic 2009 HgbA1c was 7.2  She was getting her Mounjaro  with her weight loss provider in [place], she was paying $450.      Past Medical History:  Diagnosis Date   Anemia    HX   Arthritis    knees-a little   ASCUS with positive high risk HPV cervical 03/2018   Colposcopy inadequate but negative.  ECC showed LGSIL.   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   Hypertension      Family History  Problem Relation Age of Onset   Arthritis Mother    Cancer Mother        Cervix   Diabetes Mother    Hyperlipidemia Mother    Hypertension Mother    Diabetes Father    Heart disease Father    Cancer Sister        cervical   Diabetes Sister    Hypertension Sister    Diabetes Sister    Hypertension Sister     Breast cancer Maternal Aunt    Anesthesia problems Neg Hx      Current Outpatient Medications:    Cholecalciferol (VITAMIN D ) 50 MCG (2000 UT) CAPS, Take by mouth., Disp: , Rfl:    cyclobenzaprine  (FLEXERIL ) 10 MG tablet, Take 1 tablet (10 mg total) by mouth 3 (three) times daily as needed for muscle spasms., Disp: 20 tablet, Rfl: 0   ibuprofen  (ADVIL ) 800 MG tablet, Take 1 tablet (800 mg total) by mouth 3 (three) times daily. Do NOT take on an empty stomach.  Drink plenty of water with this medication, Disp: 21 tablet, Rfl: 0   Lifitegrast (XIIDRA) 5 % SOLN, Apply to eye., Disp: , Rfl:    linaclotide  (LINZESS ) 145 MCG CAPS capsule, Take 1 capsule (145 mcg total) by mouth daily., Disp: 90 capsule, Rfl: 1   lisinopril -hydrochlorothiazide  (ZESTORETIC ) 20-25 MG tablet, Take 1 tablet by mouth daily., Disp: 90 tablet, Rfl: 1   meloxicam  (MOBIC ) 7.5 MG tablet, TAKE 1 TABLET BY MOUTH EVERY DAY, Disp: 30 tablet, Rfl: 1   Multiple Vitamins-Minerals (MULTIVITAMIN WITH MINERALS) tablet, Bariatric vitamin, Disp: 30 tablet, Rfl: 3   NOREL AD 4-10-325 MG TABS, Take 1 tablet  by mouth 2 (two) times daily as needed., Disp: 84 tablet, Rfl: 0   promethazine  (PHENERGAN ) 25 MG tablet, Take 1 tablet (25 mg total) by mouth every 8 (eight) hours as needed for nausea or vomiting., Disp: 45 tablet, Rfl: 2   SUMAtriptan  (IMITREX ) 100 MG tablet, TAKE ONE TABLET BY MOUTH AT ONSET OF HEADACHE, THEN REPEAT IN 2 HOURS, Disp: 10 tablet, Rfl: 0   valACYclovir  (VALTREX ) 500 MG tablet, Take 1 tablet (500 mg total) by mouth daily., Disp: 30 tablet, Rfl: 1   cetirizine  (ZYRTEC ) 10 MG tablet, Take 1 tablet (10 mg total) by mouth daily., Disp: 30 tablet, Rfl: 3   tirzepatide  (MOUNJARO ) 5 MG/0.5ML Pen, Inject 5 mg into the skin once a week., Disp: 2 mL, Rfl: 0   Allergies  Allergen Reactions   Sulfa  Antibiotics Hives     Review of Systems  Constitutional: Negative.   HENT: Negative.    Eyes: Negative.   Respiratory:  Negative.    Cardiovascular: Negative.   Gastrointestinal:  Positive for nausea. Negative for constipation.  Endocrine: Negative.   Genitourinary: Negative.   Musculoskeletal: Negative.   Skin: Negative.   Allergic/Immunologic: Negative.   Neurological: Negative.   Hematological: Negative.   Psychiatric/Behavioral: Negative.       Today's Vitals   01/24/24 1611  BP: 110/60  Pulse: 75  Temp: 98.5 F (36.9 C)  TempSrc: Oral  Weight: 176 lb 6.4 oz (80 kg)  Height: 5' 6 (1.676 m)  PainSc: 0-No pain   Body mass index is 28.47 kg/m.  Wt Readings from Last 3 Encounters:  01/24/24 176 lb 6.4 oz (80 kg)  11/07/23 192 lb 3.2 oz (87.2 kg)  07/18/23 224 lb 12.8 oz (102 kg)     Objective:  Physical Exam Vitals reviewed.  Constitutional:      General: She is not in acute distress.    Appearance: Normal appearance. She is well-developed. She is obese.  Eyes:     Pupils: Pupils are equal, round, and reactive to light.  Cardiovascular:     Rate and Rhythm: Normal rate and regular rhythm.     Pulses: Normal pulses.     Heart sounds: Normal heart sounds. No murmur heard. Pulmonary:     Effort: Pulmonary effort is normal. No respiratory distress.     Breath sounds: Normal breath sounds. No wheezing.  Musculoskeletal:        General: Normal range of motion.  Skin:    General: Skin is warm and dry.     Capillary Refill: Capillary refill takes less than 2 seconds.  Neurological:     General: No focal deficit present.     Mental Status: She is alert and oriented to person, place, and time.     Cranial Nerves: No cranial nerve deficit.     Motor: No weakness.  Psychiatric:        Mood and Affect: Mood normal.        Behavior: Behavior normal.        Thought Content: Thought content normal.        Judgment: Judgment normal.         Assessment And Plan:  Controlled type 2 diabetes mellitus with other circulatory complication, without long-term current use of insulin   [facility]) Assessment & Plan: She had an elevated HgbA1c in 2009 of 7.2. will see if we can get her approved for Mounjaro .  Orders: -     Mounjaro ; Inject 5 mg into the skin once a  week.  Dispense: 2 mL; Refill: 0  Seasonal allergies -     Cetirizine  HCl; Take 1 tablet (10 mg total) by mouth daily.  Dispense: 30 tablet; Refill: 3  Nausea Assessment & Plan: Likely related to taking Mounjaro , advised to avoid high fat foods. Due to the persistence however will check a gallbladder ultrasound.   Orders: -     US  ABDOMEN LIMITED RUQ (LIVER/GB); Future -     Amylase -     Lipase  COVID-19 vaccination declined Assessment & Plan: Declines covid 19 vaccine. Discussed risk of covid 62 and if she changes her mind about the vaccine to call the office. Education has been provided regarding the importance of this vaccine but patient still declined. Advised may receive this vaccine at local pharmacy or Health Dept.or vaccine clinic. Aware to provide a copy of the vaccination record if obtained from local pharmacy or Health Dept.  Encouraged to take multivitamin, vitamin d , vitamin c and zinc to increase immune system. Aware can call office if would like to have vaccine here at office. Verbalized acceptance and understanding.    Need for pneumococcal vaccination Assessment & Plan: Prevnar 20 given today  Orders: -     Pneumococcal conjugate vaccine 20-valent  Overweight with body mass index (BMI) of 28 to 28.9 in adult    Return for 2 months weight check.  Patient was given opportunity to ask questions. Patient verbalized understanding of the plan and was able to repeat key elements of the plan. All questions were answered to their satisfaction.    LILLETTE Amy Ada, FNP, have reviewed all documentation for this visit. The documentation on 01/24/24 for the exam, diagnosis, procedures, and orders are all accurate and complete.   IF YOU HAVE BEEN REFERRED TO A SPECIALIST, IT MAY TAKE 1-2 WEEKS TO  SCHEDULE/PROCESS THE REFERRAL. IF YOU HAVE NOT HEARD FROM US /SPECIALIST IN TWO WEEKS, PLEASE GIVE US  A CALL AT [phone].

## 2024-01-24 NOTE — Assessment & Plan Note (Signed)
 Likely related to taking Mounjaro , advised to avoid high fat foods. Due to the persistence however will check a gallbladder ultrasound.

## 2024-01-25 LAB — AMYLASE: Amylase: 112 U/L — ABNORMAL HIGH (ref 31–110)

## 2024-01-25 LAB — LIPASE: Lipase: 79 U/L — ABNORMAL HIGH (ref 14–72)

## 2024-01-28 ENCOUNTER — Other Ambulatory Visit: Payer: Self-pay | Admitting: Nurse Practitioner

## 2024-01-28 DIAGNOSIS — J302 Other seasonal allergic rhinitis: Secondary | ICD-10-CM

## 2024-02-04 ENCOUNTER — Ambulatory Visit
Admission: RE | Admit: 2024-02-04 | Discharge: 2024-02-04 | Disposition: A | Discharge status: Home / Self Care | Payer: BC Managed Care – PPO | Source: Ambulatory Visit | Attending: Nurse Practitioner | Admitting: Nurse Practitioner

## 2024-02-04 DIAGNOSIS — K828 Other specified diseases of gallbladder: Secondary | ICD-10-CM | POA: Diagnosis not present

## 2024-02-04 DIAGNOSIS — R110 Nausea: Secondary | ICD-10-CM

## 2024-02-19 ENCOUNTER — Telehealth: Payer: Self-pay

## 2024-02-19 NOTE — Telephone Encounter (Signed)
 [facility]  APPROVED 02/06/2024-02/05/2025  REFERENCE NUMBER [id number].

## 2024-02-20 ENCOUNTER — Other Ambulatory Visit: Payer: Self-pay | Admitting: Nurse Practitioner

## 2024-02-20 ENCOUNTER — Encounter: Payer: Self-pay | Admitting: Nurse Practitioner

## 2024-02-20 DIAGNOSIS — K828 Other specified diseases of gallbladder: Secondary | ICD-10-CM

## 2024-02-20 DIAGNOSIS — R110 Nausea: Secondary | ICD-10-CM

## 2024-02-25 ENCOUNTER — Ambulatory Visit: Payer: 59 | Admitting: Nurse Practitioner

## 2024-03-25 ENCOUNTER — Other Ambulatory Visit: Payer: Self-pay | Admitting: Nurse Practitioner

## 2024-03-25 DIAGNOSIS — I10 Essential (primary) hypertension: Secondary | ICD-10-CM

## 2024-03-25 DIAGNOSIS — E1159 Type 2 diabetes mellitus with other circulatory complications: Secondary | ICD-10-CM

## 2024-04-01 ENCOUNTER — Ambulatory Visit: Payer: BC Managed Care – PPO | Admitting: Nurse Practitioner

## 2024-04-10 DIAGNOSIS — S01112A Laceration without foreign body of left eyelid and periocular area, initial encounter: Secondary | ICD-10-CM | POA: Diagnosis not present

## 2024-04-16 ENCOUNTER — Other Ambulatory Visit: Payer: Self-pay | Admitting: Nurse Practitioner

## 2024-04-16 ENCOUNTER — Other Ambulatory Visit

## 2024-04-16 ENCOUNTER — Ambulatory Visit: Admitting: Nurse Practitioner

## 2024-04-16 DIAGNOSIS — K760 Fatty (change of) liver, not elsewhere classified: Secondary | ICD-10-CM

## 2024-04-16 NOTE — Progress Notes (Deleted)
 [place], CMA,acting as a Neurosurgeon for Susanna Epley, FNP.,have documented all relevant documentation on the behalf of Susanna Epley, FNP,as directed by  Susanna Epley, FNP while in the presence of Susanna Epley, FNP.  Subjective:  Patient ID: Amy Blair , female    DOB: 04/05/1966 , 58 y.o.   MRN: [id number]  No chief complaint on file.   HPI  HPI   Past Medical History:  Diagnosis Date   Anemia    HX   Arthritis    knees-a little   ASCUS with positive high risk HPV cervical 03/2018   Colposcopy inadequate but negative.  ECC showed LGSIL.   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   Hypertension      Family History  Problem Relation Age of Onset   Arthritis Mother    Cancer Mother        Cervix   Diabetes Mother    Hyperlipidemia Mother    Hypertension Mother    Diabetes Father    Heart disease Father    Cancer Sister        cervical   Diabetes Sister    Hypertension Sister    Diabetes Sister    Hypertension Sister    Breast cancer Maternal Aunt    Anesthesia problems Neg Hx      Current Outpatient Medications:    cetirizine  (ZYRTEC ) 10 MG tablet, TAKE 1 TABLET BY MOUTH EVERY DAY, Disp: 90 tablet, Rfl: 1   Cholecalciferol (VITAMIN D ) 50 MCG (2000 UT) CAPS, Take by mouth., Disp: , Rfl:    cyclobenzaprine  (FLEXERIL ) 10 MG tablet, Take 1 tablet (10 mg total) by mouth 3 (three) times daily as needed for muscle spasms., Disp: 20 tablet, Rfl: 0   ibuprofen  (ADVIL ) 800 MG tablet, Take 1 tablet (800 mg total) by mouth 3 (three) times daily. Do NOT take on an empty stomach.  Drink plenty of water with this medication, Disp: 21 tablet, Rfl: 0   Lifitegrast (XIIDRA) 5 % SOLN, Apply to eye., Disp: , Rfl:    linaclotide  (LINZESS ) 145 MCG CAPS capsule, Take 1 capsule (145 mcg total) by mouth daily., Disp: 90 capsule, Rfl: 1   lisinopril -hydrochlorothiazide  (ZESTORETIC ) 20-25 MG tablet,  TAKE 1 TABLET BY MOUTH EVERY DAY, Disp: 90 tablet, Rfl: 1   meloxicam  (MOBIC ) 7.5 MG tablet, TAKE 1 TABLET BY MOUTH EVERY DAY, Disp: 30 tablet, Rfl: 1   Multiple Vitamins-Minerals (MULTIVITAMIN WITH MINERALS) tablet, Bariatric vitamin, Disp: 30 tablet, Rfl: 3   NOREL AD 4-10-325 MG TABS, Take 1 tablet by mouth 2 (two) times daily as needed., Disp: 84 tablet, Rfl: 0   promethazine  (PHENERGAN ) 25 MG tablet, Take 1 tablet (25 mg total) by mouth every 8 (eight) hours as needed for nausea or vomiting., Disp: 45 tablet, Rfl: 2   SUMAtriptan  (IMITREX ) 100 MG tablet, TAKE ONE TABLET BY MOUTH AT ONSET OF HEADACHE, THEN REPEAT IN 2 HOURS, Disp: 10 tablet, Rfl: 0   tirzepatide  (MOUNJARO ) 5 MG/0.5ML Pen, INJECT 5 MG SUBCUTANEOUSLY WEEKLY, Disp: 2 mL, Rfl: 1   valACYclovir  (VALTREX ) 500 MG tablet, Take 1 tablet (500 mg total) by mouth daily., Disp: 30 tablet, Rfl: 1   Allergies  Allergen Reactions   Sulfa  Antibiotics Hives     Review of Systems   There were no vitals filed for this visit. There is no height or weight on file  to calculate BMI.  Wt Readings from Last 3 Encounters:  01/24/24 176 lb 6.4 oz (80 kg)  11/07/23 192 lb 3.2 oz (87.2 kg)  07/18/23 224 lb 12.8 oz (102 kg)    The ASCVD Risk score (Arnett DK, et al., 2019) failed to calculate for the following reasons:   The valid total cholesterol range is 130 to 320 mg/dL  Objective:  Physical Exam      Assessment And Plan:  There are no diagnoses linked to this encounter.  No follow-ups on file.  Patient was given opportunity to ask questions. Patient verbalized understanding of the plan and was able to repeat key elements of the plan. All questions were answered to their satisfaction.    Inge Mangle, FNP, have reviewed all documentation for this visit. The documentation on 04/16/24 for the exam, diagnosis, procedures, and orders are all accurate and complete.   IF YOU HAVE BEEN REFERRED TO A SPECIALIST, IT MAY TAKE 1-2 WEEKS TO  SCHEDULE/PROCESS THE REFERRAL. IF YOU HAVE NOT HEARD FROM US /SPECIALIST IN TWO WEEKS, PLEASE GIVE US  A CALL AT [phone] X [phone].

## 2024-04-17 ENCOUNTER — Encounter: Payer: Self-pay | Admitting: Nurse Practitioner

## 2024-04-17 LAB — HEPATIC FUNCTION PANEL
ALT: 25 IU/L (ref 0–32)
AST: 29 IU/L (ref 0–40)
Albumin: 4 g/dL (ref 3.8–4.9)
Alkaline Phosphatase: 104 IU/L (ref 44–121)
Bilirubin Total: <0.2 mg/dL (ref 0.0–1.2)
Bilirubin, Direct: 0.11 mg/dL (ref 0.00–0.40)
Total Protein: 7.4 g/dL (ref 6.0–8.5)

## 2024-04-24 ENCOUNTER — Ambulatory Visit: Payer: BC Managed Care – PPO | Admitting: Nurse Practitioner

## 2024-04-24 DIAGNOSIS — R1013 Epigastric pain: Secondary | ICD-10-CM | POA: Diagnosis not present

## 2024-04-24 DIAGNOSIS — Z9884 Bariatric surgery status: Secondary | ICD-10-CM | POA: Diagnosis not present

## 2024-07-01 ENCOUNTER — Telehealth: Payer: Self-pay | Admitting: Pharmacist

## 2024-07-01 DIAGNOSIS — E1122 Type 2 diabetes mellitus with diabetic chronic kidney disease: Secondary | ICD-10-CM

## 2024-07-01 NOTE — Progress Notes (Signed)
   07/01/2024  Patient ID: Amy Blair, female   DOB: February 28, 1966, 59 y.o.   MRN: [id number]                                       Per review of chart and payor information, patient has a diagnosis of diabetes but is not currently filling a statin prescription.    I could not find any documentation of previous trial of a statin or a history of statin intolerance.        Component Value Date/Time   CHOL 122 11/07/2023 1003   TRIG 73 11/07/2023 1003   HDL 45 11/07/2023 1003   CHOLHDL 2.7 11/07/2023 1003   CHOLHDL 2.6 09/21/2020 0908   VLDL 13 04/07/2015 1012   LDLCALC 62 11/07/2023 1003   LDLCALC 76 09/21/2020 0908    If deemed therapeutically appropriate, one of the following recommendations should be considered:  Initiate high intensity statin Atorvastatin 40mg  once daily, #90, 3 refills   Rosuvastatin 20mg  once daily, #90, 3 refills    Initiate moderate intensity          statin with reduced frequency if prior          statin intolerance 1x weekly, #13, 3 refills   2x weekly, #26, 3 refills   3x weekly, #39, 3 refills    Code for past statin intolerance or  other exclusions (required annually)   Provider Requirements:  Associate code during an office visit or telehealth encounter  Drug Induced Myopathy G72.0   Myopathy, unspecified G72.9   Myositis, unspecified M60.9   Rhabdomyolysis M62.82   Alcoholic fatty liver K70.0   Cirrhosis of liver K74.69   Prediabetes R73.03   PCOS E28.2   Toxic liver disease, unspecified K71.9          Patient's HgA1c is 5.6%. Liver enzymes are normal although they have been elevated in the past. There is documentation of fatty liver.    Plan: Will send note to Provider delayed prior to the upcoming appointment 07/22/24.   Cassius DOROTHA Brought, PharmD, BCACP Clinical Pharmacist [phone]

## 2024-07-02 ENCOUNTER — Other Ambulatory Visit: Payer: Self-pay | Admitting: Nurse Practitioner

## 2024-07-02 DIAGNOSIS — J302 Other seasonal allergic rhinitis: Secondary | ICD-10-CM

## 2024-07-02 DIAGNOSIS — E1159 Type 2 diabetes mellitus with other circulatory complications: Secondary | ICD-10-CM

## 2024-07-07 ENCOUNTER — Other Ambulatory Visit: Payer: Self-pay | Admitting: Nurse Practitioner

## 2024-07-07 DIAGNOSIS — E1159 Type 2 diabetes mellitus with other circulatory complications: Secondary | ICD-10-CM

## 2024-07-07 NOTE — Telephone Encounter (Unsigned)
 Copied from CRM [id number]. Topic: Clinical - Medication Refill >> Jul 07, 2024 12:14 PM Donna BRAVO wrote: Patient requesting medication refill  Medication: MOUNJARO  5 MG/0.5ML Pen [Pharmacy Med Name: MOUNJARO  5 MG/0.5 ML PEN]  Has the patient contacted their pharmacy? Yes pharmacy stated there are no refills   This is the patient's preferred pharmacy:   [facility] [phone] [facility], [place] - [address] [place] [address] Phone: [phone] Fax: [phone]  Is this the correct pharmacy for this prescription? Yes If no, delete pharmacy and type the correct one.   Has the prescription been filled recently? Yes  Is the patient out of the medication? Yes  Has the patient been seen for an appointment in the last year OR does the patient have an upcoming appointment? Yes  Can we respond through MyChart? Yes  Agent: Please be advised that Rx refills may take up to 3 business days. We ask that you follow-up with your pharmacy.

## 2024-07-21 ENCOUNTER — Encounter: Payer: 59 | Admitting: Nurse Practitioner

## 2024-07-22 ENCOUNTER — Encounter: Payer: 59 | Admitting: Nurse Practitioner

## 2024-08-04 ENCOUNTER — Other Ambulatory Visit: Payer: Self-pay | Admitting: Nurse Practitioner

## 2024-08-04 DIAGNOSIS — I10 Essential (primary) hypertension: Secondary | ICD-10-CM

## 2024-08-11 DIAGNOSIS — H02724 Madarosis of left upper eyelid and periocular area: Secondary | ICD-10-CM | POA: Diagnosis not present

## 2024-08-11 DIAGNOSIS — H02721 Madarosis of right upper eyelid and periocular area: Secondary | ICD-10-CM | POA: Diagnosis not present

## 2024-08-14 ENCOUNTER — Ambulatory Visit (INDEPENDENT_AMBULATORY_CARE_PROVIDER_SITE_OTHER): Admitting: Nurse Practitioner

## 2024-08-14 ENCOUNTER — Encounter: Payer: Self-pay | Admitting: Nurse Practitioner

## 2024-08-14 VITALS — BP 130/80 | HR 51 | Temp 97.8°F | Ht 66.0 in | Wt 168.6 lb

## 2024-08-14 DIAGNOSIS — I10 Essential (primary) hypertension: Secondary | ICD-10-CM | POA: Diagnosis not present

## 2024-08-14 DIAGNOSIS — E538 Deficiency of other specified B group vitamins: Secondary | ICD-10-CM | POA: Diagnosis not present

## 2024-08-14 DIAGNOSIS — Z139 Encounter for screening, unspecified: Secondary | ICD-10-CM

## 2024-08-14 DIAGNOSIS — Z9884 Bariatric surgery status: Secondary | ICD-10-CM

## 2024-08-14 DIAGNOSIS — E1159 Type 2 diabetes mellitus with other circulatory complications: Secondary | ICD-10-CM | POA: Diagnosis not present

## 2024-08-14 DIAGNOSIS — Z0000 Encounter for general adult medical examination without abnormal findings: Secondary | ICD-10-CM

## 2024-08-14 DIAGNOSIS — D508 Other iron deficiency anemias: Secondary | ICD-10-CM | POA: Diagnosis not present

## 2024-08-14 DIAGNOSIS — R252 Cramp and spasm: Secondary | ICD-10-CM

## 2024-08-14 LAB — POCT URINALYSIS DIP (CLINITEK)
Bilirubin, UA: NEGATIVE
Blood, UA: NEGATIVE
Glucose, UA: NEGATIVE mg/dL
Ketones, POC UA: NEGATIVE mg/dL
Leukocytes, UA: NEGATIVE
Nitrite, UA: NEGATIVE
Spec Grav, UA: 1.025 (ref 1.010–1.025)
Urobilinogen, UA: 1 U/dL
pH, UA: 6 (ref 5.0–8.0)

## 2024-08-14 NOTE — Progress Notes (Signed)
 [name], CMA,acting as a Neurosurgeon for Gaines Ada, FNP.,have documented all relevant documentation on the behalf of Gaines Ada, FNP,as directed by  Gaines Ada, FNP while in the presence of Gaines Ada, FNP.  Subjective:    Patient ID: Amy Blair , female    DOB: [birth year] , 58 y.o.   MRN: [id number]  Chief Complaint  Patient presents with   Annual Exam    Patient presents today for HM, Patient reports compliance with medication. Patient denies any chest pain, SOB, or headaches. Patient has no concerns today.    Obesity    Patient would like a referral to [facility] Dr.Stephen Mcnatt. She reports he did her weight loss surgery and she now has to be checked for a stomach ulcer.      HPI  Discussed the use of AI scribe software for clinical note transcription with the patient, who gave verbal consent to proceed.  History of Present Illness Amy Blair is a 58 year old female who presents for an annual physical exam.  She experiences ongoing abdominal discomfort, particularly with certain foods causing irritation. She is currently avoiding foods that irritate her stomach.  She experiences nocturnal leg cramps, particularly in her calves. She drinks pickle juice and water to alleviate them, consuming approximately 62 ounces of water daily. There is a concern about potential low potassium levels contributing to the cramps.  Her diet is regular, consisting of coffee, snacks, and one meal a day, typically including vegetables and meat. She has a history of significant weight loss, previously weighing 393 pounds, and is currently 168 pounds. She attributes her weight loss to the medication Medara.  She is active, frequently walking and maintaining her two-acre yard. She plans to start water aerobics with her aunt.  She has a history of menopause with no abnormal vaginal bleeding and does not currently have a gynecologist.  She recently saw her eye  doctor, who prescribed Latisse for her eyelashes. She is currently using a cream for an allergic reaction around her eyes, which she will continue for seven days before starting Latisse in fourteen days.  No chest pain, shortness of breath, trouble swallowing, swelling in feet or ankles, or diarrhea. She reports occasional constipation, possibly related to spinach consumption.   Past Medical History:  Diagnosis Date   Anemia    HX   Arthritis    knees-a little   ASCUS with positive high risk HPV cervical 03/2018   Colposcopy inadequate but negative.  ECC showed LGSIL.   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   Hypertension      Family History  Problem Relation Age of Onset   Arthritis Mother    Cancer Mother        Cervix   Diabetes Mother    Hyperlipidemia Mother    Hypertension Mother    Diabetes Father    Heart disease Father    Cancer Sister        cervical   Diabetes Sister    Hypertension Sister    Diabetes Sister    Hypertension Sister    Breast cancer Maternal Aunt    Anesthesia problems Neg Hx      Current Outpatient Medications:    cetirizine  (ZYRTEC ) 10 MG tablet, TAKE 1 TABLET BY MOUTH EVERY DAY, Disp: 90  tablet, Rfl: 1   Cholecalciferol (VITAMIN D ) 50 MCG (2000 UT) CAPS, Take by mouth., Disp: , Rfl:    cyclobenzaprine  (FLEXERIL ) 10 MG tablet, Take 1 tablet (10 mg total) by mouth 3 (three) times daily as needed for muscle spasms., Disp: 20 tablet, Rfl: 0   ibuprofen  (ADVIL ) 800 MG tablet, Take 1 tablet (800 mg total) by mouth 3 (three) times daily. Do NOT take on an empty stomach.  Drink plenty of water with this medication, Disp: 21 tablet, Rfl: 0   Lifitegrast (XIIDRA) 5 % SOLN, Apply to eye., Disp: , Rfl:    linaclotide  (LINZESS ) 145 MCG CAPS capsule, Take 1 capsule (145 mcg total) by mouth daily., Disp: 90 capsule, Rfl: 1    lisinopril -hydrochlorothiazide  (ZESTORETIC ) 20-25 MG tablet, TAKE 1 TABLET BY MOUTH EVERY DAY, Disp: 90 tablet, Rfl: 1   meloxicam  (MOBIC ) 7.5 MG tablet, TAKE 1 TABLET BY MOUTH EVERY DAY, Disp: 30 tablet, Rfl: 1   Multiple Vitamins-Minerals (MULTIVITAMIN WITH MINERALS) tablet, Bariatric vitamin, Disp: 30 tablet, Rfl: 3   NOREL AD 4-10-325 MG TABS, Take 1 tablet by mouth 2 (two) times daily as needed., Disp: 84 tablet, Rfl: 0   promethazine  (PHENERGAN ) 25 MG tablet, Take 1 tablet (25 mg total) by mouth every 8 (eight) hours as needed for nausea or vomiting., Disp: 45 tablet, Rfl: 2   SUMAtriptan  (IMITREX ) 100 MG tablet, TAKE ONE TABLET BY MOUTH AT ONSET OF HEADACHE, THEN REPEAT IN 2 HOURS, Disp: 10 tablet, Rfl: 0   tirzepatide  (MOUNJARO ) 5 MG/0.5ML Pen, INJECT 5 MG SUBCUTANEOUSLY WEEKLY, Disp: 2 mL, Rfl: 1   valACYclovir  (VALTREX ) 500 MG tablet, Take 1 tablet (500 mg total) by mouth daily., Disp: 30 tablet, Rfl: 1   Allergies  Allergen Reactions   Sulfa  Antibiotics Hives      The patient states she uses post menopausal status for birth control. Patient's last menstrual period was 02/20/2012.. Negative for Dysmenorrhea and Negative for Menorrhagia. Negative for: breast discharge, breast lump(s), breast pain and breast self exam. Associated symptoms include abnormal vaginal bleeding. Pertinent negatives include abnormal bleeding (hematology), anxiety, decreased libido, depression, difficulty falling sleep, dyspareunia, history of infertility, nocturia, sexual dysfunction, sleep disturbances, urinary incontinence, urinary urgency, vaginal discharge and vaginal itching. Diet regular; she is avoiding foods that irritate her stomach.  The patient states her exercise level is minimal - she does walk a lot and wants to start water aerobics.   The patient's tobacco use is:  Social History   Tobacco Use  Smoking Status Never  Smokeless Tobacco Never  . She has been exposed to passive smoke. The  patient's alcohol use is:  Social History   Substance and Sexual Activity  Alcohol Use Not Currently  Additional information: Last pap 2021, next one scheduled for 2025.    Review of Systems  Constitutional: Negative.   HENT: Negative.    Eyes: Negative.   Respiratory: Negative.    Cardiovascular: Negative.   Gastrointestinal:  Negative for constipation.  Endocrine: Negative.   Genitourinary:  Negative for enuresis.  Musculoskeletal: Negative.   Skin: Negative.   Allergic/Immunologic: Negative.   Neurological: Negative.   Hematological: Negative.   Psychiatric/Behavioral: Negative.       Today's Vitals   08/14/24 1157  BP: 130/80  Pulse: (!) 51  Temp: 97.8 F (36.6 C)  TempSrc: Oral  Weight: 168 lb 9.6 oz (76.5 kg)  Height: 5' 6 (1.676 m)  PainSc: 0-No pain   Body mass index is 27.21 kg/m.  Wt  Readings from Last 3 Encounters:  08/25/24 168 lb (76.2 kg)  08/14/24 168 lb 9.6 oz (76.5 kg)  01/24/24 176 lb 6.4 oz (80 kg)     Objective:  Physical Exam Vitals and nursing note reviewed.  Constitutional:      General: She is not in acute distress.    Appearance: Normal appearance. She is well-developed.     Comments: overweight  HENT:     Head: Normocephalic and atraumatic.     Right Ear: Hearing, tympanic membrane, ear canal and external ear normal. There is no impacted cerumen.     Left Ear: Hearing, tympanic membrane, ear canal and external ear normal. There is no impacted cerumen.     Nose: Nose normal.     Mouth/Throat:     Mouth: Mucous membranes are moist.  Eyes:     General: Lids are normal.     Extraocular Movements: Extraocular movements intact.     Conjunctiva/sclera: Conjunctivae normal.     Pupils: Pupils are equal, round, and reactive to light.     Funduscopic exam:    Right eye: No papilledema.        Left eye: No papilledema.  Neck:     Thyroid: No thyroid mass.     Vascular: No carotid bruit.  Cardiovascular:     Rate and Rhythm: Normal  rate and regular rhythm.     Pulses: Normal pulses.     Heart sounds: Normal heart sounds. No murmur heard. Pulmonary:     Effort: Pulmonary effort is normal. No respiratory distress.     Breath sounds: Normal breath sounds. No wheezing.  Chest:     Chest wall: No mass.  Breasts:    Tanner Score is 5.     Right: Normal. No mass or tenderness.     Left: Normal. No mass or tenderness.  Abdominal:     General: Abdomen is flat. Bowel sounds are normal. There is no distension.     Palpations: Abdomen is soft.     Tenderness: There is no abdominal tenderness.  Genitourinary:    Comments: Deferred followed by GYN Musculoskeletal:        General: No swelling. Normal range of motion.     Cervical back: Full passive range of motion without pain, normal range of motion and neck supple.     Right lower leg: No edema.     Left lower leg: No edema.  Lymphadenopathy:     Upper Body:     Right upper body: No supraclavicular, axillary or pectoral adenopathy.     Left upper body: No supraclavicular, axillary or pectoral adenopathy.  Skin:    General: Skin is warm and dry.     Capillary Refill: Capillary refill takes less than 2 seconds.  Neurological:     General: No focal deficit present.     Mental Status: She is alert and oriented to person, place, and time.     Cranial Nerves: No cranial nerve deficit.     Sensory: No sensory deficit.     Motor: No weakness.  Psychiatric:        Mood and Affect: Mood normal.        Behavior: Behavior normal.        Thought Content: Thought content normal.        Judgment: Judgment normal.         Assessment And Plan:     Encounter for annual health examination Assessment & Plan: Routine wellness visit with  weight loss from 176 lbs to 168 lbs. No post-menopausal bleeding. Regular eye exams. No cardiopulmonary symptoms. Occasional constipation linked to spinach intake. - Order routine labs. - Schedule flu vaccination for next month.   Controlled  type 2 diabetes mellitus with other circulatory complication, without long-term current use of insulin  [facility]) Assessment & Plan: Tolerating Mounjaro  well. Continue current regimen.   Orders: -     EKG 12-Lead -     Microalbumin / creatinine urine ratio -     POCT URINALYSIS DIP (CLINITEK) -     CMP14+EGFR -     Hemoglobin A1c -     Lipid panel  Essential hypertension Assessment & Plan: Blood pressure is fairly controlled, continue focusing on lifestyle modifications.  EKG done with sinus bradycardia HR 51  Orders: -     EKG 12-Lead -     Microalbumin / creatinine urine ratio -     POCT URINALYSIS DIP (CLINITEK)  Other iron deficiency anemia Assessment & Plan: Will check iron levels, has a history of gastric bypass  Orders: -     CBC with Differential/Platelet -     Iron, TIBC and Ferritin Panel  B12 deficiency Assessment & Plan: Will recheck vitamin B12, history of gastric bypass  Orders: -     CBC with Differential/Platelet -     Vitamin B12  Gastric bypass status for obesity Assessment & Plan: Gastrointestinal symptoms after bariatric surgery (nausea, food intolerance, possible ulcer) Persistent gastrointestinal symptoms post-bariatric surgery with suspected ulcer. - Refer to Dr. Garnette Ginger for evaluation of possible ulcer.  Orders: -     Amb Ref to [facility]  Muscle cramps Assessment & Plan: Nighttime calf cramps with adequate hydration and pickle juice use. Possible low potassium. - Order labs to check potassium levels.  Orders: -     Magnesium  Encounter for screening -     Hepatitis B surface antibody,qualitative    Return for 1 year physical, controlled DM check 4 months; schedule influenza. Patient was given opportunity to ask questions. Patient verbalized understanding of the plan and was able to repeat key elements of the plan. All questions were answered to their satisfaction.   Gaines Ada, FNP  I, Gaines Ada, FNP, have  reviewed all documentation for this visit. The documentation on 08/14/24 for the exam, diagnosis, procedures, and orders are all accurate and complete.

## 2024-08-14 NOTE — Patient Instructions (Signed)
 Health Maintenance  Topic Date Due   Hepatitis B Vaccine (1 of 3 - 19+ 3-dose series) Never done   Hemoglobin A1C  05/06/2024   Eye exam for diabetics  05/16/2024   Yearly kidney health urinalysis for diabetes  07/17/2024   COVID-19 Vaccine (7 - Moderna risk 2024-25 season) 08/30/2024*   Flu Shot  03/17/2025*   Yearly kidney function blood test for diabetes  11/06/2024   Complete foot exam   11/06/2024   Pap with HPV screening  09/21/2025   Mammogram  09/27/2025   DTaP/Tdap/Td vaccine (3 - Td or Tdap) 09/17/2028   Colon Cancer Screening  05/28/2029   Pneumococcal Vaccine for age over 65  Completed   Hepatitis C Screening  Completed   HIV Screening  Completed   Zoster (Shingles) Vaccine  Completed   HPV Vaccine  Aged Out   Meningitis B Vaccine  Aged Out  *Topic was postponed. The date shown is not the original due date.

## 2024-08-15 LAB — CBC WITH DIFFERENTIAL/PLATELET
Basophils Absolute: 0 x10E3/uL (ref 0.0–0.2)
Basos: 0 %
EOS (ABSOLUTE): 0 x10E3/uL (ref 0.0–0.4)
Eos: 1 %
Hematocrit: 30.9 % — ABNORMAL LOW (ref 34.0–46.6)
Hemoglobin: 9.9 g/dL — ABNORMAL LOW (ref 11.1–15.9)
Immature Grans (Abs): 0 x10E3/uL (ref 0.0–0.1)
Immature Granulocytes: 0 %
Lymphocytes Absolute: 2.2 x10E3/uL (ref 0.7–3.1)
Lymphs: 55 %
MCH: 30.9 pg (ref 26.6–33.0)
MCHC: 32 g/dL (ref 31.5–35.7)
MCV: 97 fL (ref 79–97)
Monocytes Absolute: 0.4 x10E3/uL (ref 0.1–0.9)
Monocytes: 9 %
Neutrophils Absolute: 1.4 x10E3/uL (ref 1.4–7.0)
Neutrophils: 35 %
Platelets: 354 x10E3/uL (ref 150–450)
RBC: 3.2 x10E6/uL — ABNORMAL LOW (ref 3.77–5.28)
RDW: 12.1 % (ref 11.7–15.4)
WBC: 4 x10E3/uL (ref 3.4–10.8)

## 2024-08-15 LAB — CMP14+EGFR
ALT: 20 IU/L (ref 0–32)
AST: 31 IU/L (ref 0–40)
Albumin: 4.2 g/dL (ref 3.8–4.9)
Alkaline Phosphatase: 103 IU/L (ref 44–121)
BUN/Creatinine Ratio: 24 — ABNORMAL HIGH (ref 9–23)
BUN: 22 mg/dL (ref 6–24)
Bilirubin Total: 0.4 mg/dL (ref 0.0–1.2)
CO2: 21 mmol/L (ref 20–29)
Calcium: 9.3 mg/dL (ref 8.7–10.2)
Chloride: 103 mmol/L (ref 96–106)
Creatinine, Ser: 0.92 mg/dL (ref 0.57–1.00)
Globulin, Total: 3.3 g/dL (ref 1.5–4.5)
Glucose: 67 mg/dL — ABNORMAL LOW (ref 70–99)
Potassium: 4 mmol/L (ref 3.5–5.2)
Sodium: 141 mmol/L (ref 134–144)
Total Protein: 7.5 g/dL (ref 6.0–8.5)
eGFR: 72 mL/min/1.73 (ref >59)

## 2024-08-15 LAB — LIPID PANEL
Chol/HDL Ratio: 2.1 ratio (ref 0.0–4.4)
Cholesterol, Total: 130 mg/dL (ref 100–199)
HDL: 63 mg/dL (ref >39)
LDL Chol Calc (NIH): 56 mg/dL (ref 0–99)
Triglycerides: 44 mg/dL (ref 0–149)
VLDL Cholesterol Cal: 11 mg/dL (ref 5–40)

## 2024-08-15 LAB — IRON,TIBC AND FERRITIN PANEL
Ferritin: 227 ng/mL — ABNORMAL HIGH (ref 15–150)
Iron Saturation: 18 % (ref 15–55)
Iron: 52 ug/dL (ref 27–159)
Total Iron Binding Capacity: 297 ug/dL (ref 250–450)
UIBC: 245 ug/dL (ref 131–425)

## 2024-08-15 LAB — HEMOGLOBIN A1C
Est. average glucose Bld gHb Est-mCnc: 111 mg/dL
Hgb A1c MFr Bld: 5.5 % (ref 4.8–5.6)

## 2024-08-15 LAB — HEPATITIS B SURFACE ANTIBODY,QUALITATIVE: Hep B Surface Ab, Qual: NONREACTIVE

## 2024-08-15 LAB — MICROALBUMIN / CREATININE URINE RATIO
Creatinine, Urine: 133.9 mg/dL
Microalb/Creat Ratio: 35 mg/g{creat} — ABNORMAL HIGH (ref 0–29)
Microalbumin, Urine: 47.4 ug/mL

## 2024-08-15 LAB — MAGNESIUM: Magnesium: 2 mg/dL (ref 1.6–2.3)

## 2024-08-15 LAB — VITAMIN B12: Vitamin B-12: 892 pg/mL (ref 232–1245)

## 2024-08-25 ENCOUNTER — Encounter: Payer: Self-pay | Admitting: Nurse Practitioner

## 2024-08-25 ENCOUNTER — Ambulatory Visit: Payer: Self-pay | Admitting: Nurse Practitioner

## 2024-08-25 ENCOUNTER — Ambulatory Visit

## 2024-08-25 VITALS — BP 120/84 | HR 80 | Temp 97.8°F | Ht 66.0 in | Wt 168.0 lb

## 2024-08-25 DIAGNOSIS — R252 Cramp and spasm: Secondary | ICD-10-CM | POA: Insufficient documentation

## 2024-08-25 DIAGNOSIS — Z23 Encounter for immunization: Secondary | ICD-10-CM | POA: Diagnosis not present

## 2024-08-25 NOTE — Assessment & Plan Note (Signed)
 Nighttime calf cramps with adequate hydration and pickle juice use. Possible low potassium. - Order labs to check potassium levels.

## 2024-08-25 NOTE — Assessment & Plan Note (Signed)
 Tolerating Mounjaro  well. Continue current regimen.

## 2024-08-25 NOTE — Assessment & Plan Note (Signed)
 Will check iron levels, has a history of gastric bypass

## 2024-08-25 NOTE — Assessment & Plan Note (Signed)
 Routine wellness visit with weight loss from 176 lbs to 168 lbs. No post-menopausal bleeding. Regular eye exams. No cardiopulmonary symptoms. Occasional constipation linked to spinach intake. - Order routine labs. - Schedule flu vaccination for next month.

## 2024-08-25 NOTE — Assessment & Plan Note (Addendum)
 Blood pressure is fairly controlled, continue focusing on lifestyle modifications.  EKG done with sinus bradycardia HR 51

## 2024-08-25 NOTE — Progress Notes (Signed)
 Patient presents today for the flu vaccine. Patient received flu vaccine in the left deltoid. Patient tolerated injection well. YL,RMA

## 2024-08-25 NOTE — Assessment & Plan Note (Signed)
 Will recheck vitamin B12, history of gastric bypass

## 2024-08-25 NOTE — Assessment & Plan Note (Signed)
 Gastrointestinal symptoms after bariatric surgery (nausea, food intolerance, possible ulcer) Persistent gastrointestinal symptoms post-bariatric surgery with suspected ulcer. - Refer to Dr. Garnette Ginger for evaluation of possible ulcer.

## 2024-08-29 ENCOUNTER — Ambulatory Visit

## 2024-09-02 ENCOUNTER — Other Ambulatory Visit: Payer: Self-pay | Admitting: Nurse Practitioner

## 2024-09-02 DIAGNOSIS — E1159 Type 2 diabetes mellitus with other circulatory complications: Secondary | ICD-10-CM

## 2024-09-22 DIAGNOSIS — W268XXA Contact with other sharp object(s), not elsewhere classified, initial encounter: Secondary | ICD-10-CM | POA: Diagnosis not present

## 2024-09-22 DIAGNOSIS — S61012A Laceration without foreign body of left thumb without damage to nail, initial encounter: Secondary | ICD-10-CM | POA: Diagnosis not present

## 2024-09-22 DIAGNOSIS — Z9884 Bariatric surgery status: Secondary | ICD-10-CM | POA: Diagnosis not present

## 2024-10-08 DIAGNOSIS — Z1231 Encounter for screening mammogram for malignant neoplasm of breast: Secondary | ICD-10-CM | POA: Diagnosis not present

## 2024-10-08 LAB — HM MAMMOGRAPHY

## 2024-10-13 DIAGNOSIS — Z9884 Bariatric surgery status: Secondary | ICD-10-CM | POA: Diagnosis not present

## 2024-10-13 DIAGNOSIS — Z791 Long term (current) use of non-steroidal anti-inflammatories (NSAID): Secondary | ICD-10-CM | POA: Diagnosis not present

## 2024-10-13 DIAGNOSIS — K9589 Other complications of other bariatric procedure: Secondary | ICD-10-CM | POA: Diagnosis not present

## 2024-10-13 DIAGNOSIS — Z79899 Other long term (current) drug therapy: Secondary | ICD-10-CM | POA: Diagnosis not present

## 2024-10-13 DIAGNOSIS — W448XXA Other foreign body entering into or through a natural orifice, initial encounter: Secondary | ICD-10-CM | POA: Diagnosis not present

## 2024-10-13 DIAGNOSIS — K2960 Other gastritis without bleeding: Secondary | ICD-10-CM | POA: Diagnosis not present

## 2024-10-13 DIAGNOSIS — R1010 Upper abdominal pain, unspecified: Secondary | ICD-10-CM | POA: Diagnosis not present

## 2024-10-13 DIAGNOSIS — T182XXA Foreign body in stomach, initial encounter: Secondary | ICD-10-CM | POA: Diagnosis not present

## 2024-11-06 ENCOUNTER — Other Ambulatory Visit: Payer: Self-pay | Admitting: Nurse Practitioner

## 2024-11-06 DIAGNOSIS — E1159 Type 2 diabetes mellitus with other circulatory complications: Secondary | ICD-10-CM

## 2024-12-15 ENCOUNTER — Ambulatory Visit: Payer: Self-pay | Admitting: Nurse Practitioner

## 2024-12-15 NOTE — Progress Notes (Deleted)
 [name], CMA,acting as a neurosurgeon for Amy Blair.,have documented all relevant documentation on the behalf of Amy Blair,as directed by  Amy Blair while in the presence of Amy Blair.  Subjective:  Patient ID: Amy Blair , female    DOB: [birth year] , 58 y.o.   MRN: [id number]  No chief complaint on file.   HPI  HPI   Past Medical History:  Diagnosis Date   Anemia    HX   Arthritis    knees-a little   ASCUS with positive high risk HPV cervical 03/2018   Colposcopy inadequate but negative.  ECC showed LGSIL.   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 There is no height or weight on file to calculate BMI.  Wt Readings from Last 3 Encounters:  08/25/24 168 lb (76.2 kg)  08/14/24 168 lb 9.6 oz (76.5 kg)  01/24/24 176 lb 6.4 oz (80 kg)    The 10-year ASCVD risk score (Arnett DK, et al., 2019) is: 23.4%   Values used to calculate the score:     Age: 27 years     Clinically relevant sex: Female     Is Non-Hispanic African American: Yes     Diabetic: Yes     Tobacco smoker: No     Systolic Blood Pressure: 176 mmHg     Is BP treated: Yes     HDL Cholesterol: 63 mg/dL     Total Cholesterol: 130 mg/dL  Objective:  Physical Exam      Assessment And Plan:   Assessment & Plan Type 2  diabetes mellitus with other circulatory complications (HCC)  Essential hypertension   No orders of the defined types were placed in this encounter.    No follow-ups on file.  Patient was given opportunity to ask questions. Patient verbalized understanding of the plan and was able to repeat key elements of the plan. All questions were answered to their satisfaction.    LILLETTE Amy Blair, have reviewed all documentation for this visit. The documentation on 12/15/2024 for the exam, diagnosis, procedures, and orders are all accurate and complete.   IF YOU HAVE BEEN REFERRED TO A SPECIALIST, IT MAY TAKE 1-2 WEEKS TO SCHEDULE/PROCESS THE REFERRAL. IF YOU HAVE NOT HEARD FROM US /SPECIALIST IN TWO WEEKS, PLEASE GIVE US  A CALL AT [phone] X [phone].     [1]  Current Outpatient Medications:    cetirizine  (ZYRTEC ) 10 MG tablet, TAKE 1 TABLET BY MOUTH EVERY DAY, Disp: 90 tablet, Rfl: 1   Cholecalciferol (VITAMIN D ) 50 MCG (2000 UT) CAPS, Take by  mouth., Disp: , Rfl:    cyclobenzaprine  (FLEXERIL ) 10 MG tablet, Take 1 tablet (10 mg total) by mouth 3 (three) times daily as needed for muscle spasms., Disp: 20 tablet, Rfl: 0   ibuprofen  (ADVIL ) 800 MG tablet, Take 1 tablet (800 mg total) by mouth 3 (three) times daily. Do NOT take on an empty stomach.  Drink plenty of water with this medication, Disp: 21 tablet, Rfl: 0   Lifitegrast (XIIDRA) 5 % SOLN, Apply to eye., Disp: , Rfl:    linaclotide  (LINZESS ) 145 MCG CAPS capsule, Take 1 capsule (145 mcg total) by mouth daily., Disp: 90 capsule, Rfl: 1   lisinopril -hydrochlorothiazide  (ZESTORETIC ) 20-25 MG tablet, TAKE 1 TABLET BY MOUTH EVERY DAY, Disp: 90 tablet, Rfl: 1   meloxicam  (MOBIC ) 7.5 MG tablet, TAKE 1 TABLET BY MOUTH EVERY DAY, Disp: 30 tablet, Rfl: 1   Multiple Vitamins-Minerals (MULTIVITAMIN WITH MINERALS) tablet, Bariatric vitamin, Disp: 30 tablet, Rfl: 3   NOREL AD 4-10-325 MG TABS, Take 1 tablet by mouth 2 (two) times daily as needed., Disp:  84 tablet, Rfl: 0   promethazine  (PHENERGAN ) 25 MG tablet, Take 1 tablet (25 mg total) by mouth every 8 (eight) hours as needed for nausea or vomiting., Disp: 45 tablet, Rfl: 2   SUMAtriptan  (IMITREX ) 100 MG tablet, TAKE ONE TABLET BY MOUTH AT ONSET OF HEADACHE, THEN REPEAT IN 2 HOURS, Disp: 10 tablet, Rfl: 0   tirzepatide  (MOUNJARO ) 5 MG/0.5ML Pen, INJECT 5 MG SUBCUTANEOUSLY WEEKLY, Disp: 2 mL, Rfl: 1   valACYclovir  (VALTREX ) 500 MG tablet, Take 1 tablet (500 mg total) by mouth daily., Disp: 30 tablet, Rfl: 1 [2]  Allergies Allergen Reactions   Sulfa  Antibiotics Hives

## 2025-01-07 ENCOUNTER — Other Ambulatory Visit: Payer: Self-pay | Admitting: Nurse Practitioner

## 2025-01-07 DIAGNOSIS — J302 Other seasonal allergic rhinitis: Secondary | ICD-10-CM

## 2025-08-20 ENCOUNTER — Encounter: Payer: Self-pay | Admitting: Nurse Practitioner
